# Patient Record
Sex: Male | Born: 1945
Health system: Southern US, Community
[De-identification: ages and names within clinical notes are randomized; demographics above are authoritative.]

## PROBLEM LIST (undated history)

## (undated) DIAGNOSIS — F32A Depression, unspecified: Secondary | ICD-10-CM

## (undated) DIAGNOSIS — E785 Hyperlipidemia, unspecified: Secondary | ICD-10-CM

## (undated) DIAGNOSIS — I639 Cerebral infarction, unspecified: Secondary | ICD-10-CM

## (undated) DIAGNOSIS — K3184 Gastroparesis: Secondary | ICD-10-CM

## (undated) DIAGNOSIS — K219 Gastro-esophageal reflux disease without esophagitis: Secondary | ICD-10-CM

## (undated) DIAGNOSIS — F329 Major depressive disorder, single episode, unspecified: Secondary | ICD-10-CM

## (undated) DIAGNOSIS — R51 Headache: Secondary | ICD-10-CM

## (undated) DIAGNOSIS — I10 Essential (primary) hypertension: Secondary | ICD-10-CM

## (undated) DIAGNOSIS — Z8719 Personal history of other diseases of the digestive system: Secondary | ICD-10-CM

## (undated) DIAGNOSIS — F419 Anxiety disorder, unspecified: Secondary | ICD-10-CM

## (undated) HISTORY — DX: Essential (primary) hypertension: I10

## (undated) HISTORY — PX: CHOLECYSTECTOMY: SHX55

## (undated) HISTORY — DX: Anxiety disorder, unspecified: F41.9

## (undated) HISTORY — DX: Hyperlipidemia, unspecified: E78.5

## (undated) HISTORY — PX: HERNIA REPAIR: SHX51

## (undated) HISTORY — DX: Cerebral infarction, unspecified: I63.9

## (undated) HISTORY — DX: Personal history of other diseases of the digestive system: Z87.19

## (undated) HISTORY — PX: ESOPHAGOGASTRIC FUNDOPLICATION: SHX405

## (undated) HISTORY — PX: EYE SURGERY: SHX253

## (undated) HISTORY — PX: LAPAROSCOPIC NISSEN FUNDOPLICATION: SHX1932

## (undated) HISTORY — DX: Depression, unspecified: F32.A

## (undated) HISTORY — DX: Major depressive disorder, single episode, unspecified: F32.9

## (undated) HISTORY — DX: Gastroparesis: K31.84

## (undated) HISTORY — DX: Gastro-esophageal reflux disease without esophagitis: K21.9

---

## 1998-08-07 ENCOUNTER — Encounter (INDEPENDENT_AMBULATORY_CARE_PROVIDER_SITE_OTHER): Payer: Self-pay | Admitting: Specialist

## 1998-08-07 ENCOUNTER — Observation Stay (HOSPITAL_COMMUNITY): Admission: EM | Admit: 1998-08-07 | Discharge: 1998-08-07 | Payer: Self-pay | Admitting: Emergency Medicine

## 1999-01-31 ENCOUNTER — Other Ambulatory Visit: Admission: RE | Admit: 1999-01-31 | Discharge: 1999-01-31 | Payer: Self-pay | Admitting: Orthopedic Surgery

## 2000-07-21 ENCOUNTER — Encounter (INDEPENDENT_AMBULATORY_CARE_PROVIDER_SITE_OTHER): Payer: Self-pay | Admitting: Specialist

## 2000-07-21 ENCOUNTER — Ambulatory Visit (HOSPITAL_COMMUNITY): Admission: RE | Admit: 2000-07-21 | Discharge: 2000-07-21 | Payer: Self-pay | Admitting: Gastroenterology

## 2001-12-19 ENCOUNTER — Encounter: Payer: Self-pay | Admitting: Urology

## 2001-12-19 ENCOUNTER — Encounter: Admission: RE | Admit: 2001-12-19 | Discharge: 2001-12-19 | Payer: Self-pay | Admitting: Urology

## 2002-12-27 ENCOUNTER — Ambulatory Visit (HOSPITAL_COMMUNITY): Admission: RE | Admit: 2002-12-27 | Discharge: 2002-12-27 | Payer: Self-pay | Admitting: Gastroenterology

## 2003-10-25 ENCOUNTER — Encounter (INDEPENDENT_AMBULATORY_CARE_PROVIDER_SITE_OTHER): Payer: Self-pay | Admitting: *Deleted

## 2003-10-25 ENCOUNTER — Ambulatory Visit (HOSPITAL_COMMUNITY): Admission: RE | Admit: 2003-10-25 | Discharge: 2003-10-25 | Payer: Self-pay | Admitting: Gastroenterology

## 2004-08-01 ENCOUNTER — Encounter (INDEPENDENT_AMBULATORY_CARE_PROVIDER_SITE_OTHER): Payer: Self-pay | Admitting: *Deleted

## 2004-08-01 ENCOUNTER — Ambulatory Visit (HOSPITAL_COMMUNITY): Admission: RE | Admit: 2004-08-01 | Discharge: 2004-08-01 | Payer: Self-pay | Admitting: Gastroenterology

## 2004-12-15 ENCOUNTER — Emergency Department (HOSPITAL_COMMUNITY): Admission: EM | Admit: 2004-12-15 | Discharge: 2004-12-15 | Payer: Self-pay | Admitting: Emergency Medicine

## 2005-01-14 ENCOUNTER — Encounter: Admission: RE | Admit: 2005-01-14 | Discharge: 2005-01-14 | Payer: Self-pay | Admitting: Family Medicine

## 2005-08-26 ENCOUNTER — Encounter: Admission: RE | Admit: 2005-08-26 | Discharge: 2005-08-26 | Payer: Self-pay | Admitting: Otolaryngology

## 2007-08-01 ENCOUNTER — Encounter: Admission: RE | Admit: 2007-08-01 | Discharge: 2007-08-01 | Payer: Self-pay | Admitting: Gastroenterology

## 2008-12-28 ENCOUNTER — Inpatient Hospital Stay (HOSPITAL_COMMUNITY): Admission: AD | Admit: 2008-12-28 | Discharge: 2008-12-31 | Payer: Self-pay | Admitting: Internal Medicine

## 2008-12-28 ENCOUNTER — Encounter: Admission: RE | Admit: 2008-12-28 | Discharge: 2008-12-28 | Payer: Self-pay | Admitting: Family Medicine

## 2008-12-31 ENCOUNTER — Encounter (INDEPENDENT_AMBULATORY_CARE_PROVIDER_SITE_OTHER): Payer: Self-pay | Admitting: Gastroenterology

## 2009-02-17 ENCOUNTER — Inpatient Hospital Stay (HOSPITAL_COMMUNITY): Admission: EM | Admit: 2009-02-17 | Discharge: 2009-02-24 | Payer: Self-pay | Admitting: Emergency Medicine

## 2009-04-03 ENCOUNTER — Ambulatory Visit (HOSPITAL_COMMUNITY): Admission: RE | Admit: 2009-04-03 | Discharge: 2009-04-03 | Payer: Self-pay | Admitting: Surgery

## 2009-10-22 ENCOUNTER — Encounter: Admission: RE | Admit: 2009-10-22 | Discharge: 2009-10-22 | Payer: Self-pay | Admitting: Internal Medicine

## 2010-03-09 ENCOUNTER — Encounter: Payer: Self-pay | Admitting: Gastroenterology

## 2010-05-04 LAB — CULTURE, BLOOD (ROUTINE X 2): Culture: NO GROWTH

## 2010-05-04 LAB — CBC
HCT: 34.4 % — ABNORMAL LOW (ref 39.0–52.0)
HCT: 34.6 % — ABNORMAL LOW (ref 39.0–52.0)
Hemoglobin: 11.4 g/dL — ABNORMAL LOW (ref 13.0–17.0)
Hemoglobin: 13.3 g/dL (ref 13.0–17.0)
Hemoglobin: 15.1 g/dL (ref 13.0–17.0)
MCHC: 33.2 g/dL (ref 30.0–36.0)
MCHC: 33.6 g/dL (ref 30.0–36.0)
MCHC: 33.8 g/dL (ref 30.0–36.0)
MCHC: 34 g/dL (ref 30.0–36.0)
MCV: 93.4 fL (ref 78.0–100.0)
MCV: 93.5 fL (ref 78.0–100.0)
MCV: 95 fL (ref 78.0–100.0)
Platelets: 281 10*3/uL (ref 150–400)
RBC: 3.7 MIL/uL — ABNORMAL LOW (ref 4.22–5.81)
RBC: 3.81 MIL/uL — ABNORMAL LOW (ref 4.22–5.81)
RBC: 4.52 MIL/uL (ref 4.22–5.81)
RDW: 13.1 % (ref 11.5–15.5)
RDW: 13.2 % (ref 11.5–15.5)
RDW: 13.4 % (ref 11.5–15.5)
WBC: 13.3 10*3/uL — ABNORMAL HIGH (ref 4.0–10.5)
WBC: 21.7 10*3/uL — ABNORMAL HIGH (ref 4.0–10.5)

## 2010-05-04 LAB — LIPASE, BLOOD
Lipase: 17 U/L (ref 11–59)
Lipase: 22 U/L (ref 11–59)
Lipase: 335 U/L — ABNORMAL HIGH (ref 11–59)
Lipase: 57 U/L (ref 11–59)

## 2010-05-04 LAB — URINALYSIS, ROUTINE W REFLEX MICROSCOPIC
Leukocytes, UA: NEGATIVE
Protein, ur: NEGATIVE mg/dL
Urobilinogen, UA: 1 mg/dL (ref 0.0–1.0)

## 2010-05-04 LAB — URINE CULTURE: Colony Count: 4000

## 2010-05-04 LAB — COMPREHENSIVE METABOLIC PANEL
ALT: 16 U/L (ref 0–53)
ALT: 23 U/L (ref 0–53)
AST: 18 U/L (ref 0–37)
AST: 22 U/L (ref 0–37)
AST: 29 U/L (ref 0–37)
Albumin: 2.1 g/dL — ABNORMAL LOW (ref 3.5–5.2)
Albumin: 2.2 g/dL — ABNORMAL LOW (ref 3.5–5.2)
Albumin: 3.6 g/dL (ref 3.5–5.2)
Alkaline Phosphatase: 71 U/L (ref 39–117)
Alkaline Phosphatase: 89 U/L (ref 39–117)
BUN: 7 mg/dL (ref 6–23)
BUN: 9 mg/dL (ref 6–23)
CO2: 25 mEq/L (ref 19–32)
CO2: 26 mEq/L (ref 19–32)
CO2: 26 mEq/L (ref 19–32)
Calcium: 8.3 mg/dL — ABNORMAL LOW (ref 8.4–10.5)
Calcium: 8.9 mg/dL (ref 8.4–10.5)
Chloride: 103 mEq/L (ref 96–112)
Chloride: 105 mEq/L (ref 96–112)
Creatinine, Ser: 1.04 mg/dL (ref 0.4–1.5)
Creatinine, Ser: 1.07 mg/dL (ref 0.4–1.5)
Creatinine, Ser: 1.16 mg/dL (ref 0.4–1.5)
GFR calc Af Amer: >60 mL/min (ref >60)
GFR calc Af Amer: >60 mL/min (ref >60)
GFR calc Af Amer: >60 mL/min (ref >60)
GFR calc non Af Amer: >60 mL/min (ref >60)
GFR calc non Af Amer: >60 mL/min (ref >60)
Glucose, Bld: 90 mg/dL (ref 70–99)
Potassium: 3.7 mEq/L (ref 3.5–5.1)
Potassium: 3.8 mEq/L (ref 3.5–5.1)
Sodium: 136 mEq/L (ref 135–145)
Sodium: 137 mEq/L (ref 135–145)
Sodium: 137 mEq/L (ref 135–145)
Total Bilirubin: 1.1 mg/dL (ref 0.3–1.2)
Total Bilirubin: 1.2 mg/dL (ref 0.3–1.2)
Total Protein: 5.2 g/dL — ABNORMAL LOW (ref 6.0–8.3)
Total Protein: 5.6 g/dL — ABNORMAL LOW (ref 6.0–8.3)

## 2010-05-04 LAB — DIFFERENTIAL
Basophils Absolute: 0 10*3/uL (ref 0.0–0.1)
Basophils Relative: 0 % (ref 0–1)
Eosinophils Absolute: 0 10*3/uL (ref 0.0–0.7)
Eosinophils Absolute: 0.2 10*3/uL (ref 0.0–0.7)
Eosinophils Relative: 0 % (ref 0–5)
Eosinophils Relative: 2 % (ref 0–5)
Lymphocytes Relative: 10 % — ABNORMAL LOW (ref 12–46)
Lymphocytes Relative: 11 % — ABNORMAL LOW (ref 12–46)
Lymphocytes Relative: 9 % — ABNORMAL LOW (ref 12–46)
Lymphs Abs: 1.1 10*3/uL (ref 0.7–4.0)
Lymphs Abs: 1.3 10*3/uL (ref 0.7–4.0)
Monocytes Absolute: 1 10*3/uL (ref 0.1–1.0)
Monocytes Absolute: 1.3 10*3/uL — ABNORMAL HIGH (ref 0.1–1.0)
Monocytes Relative: 10 % (ref 3–12)
Monocytes Relative: 12 % (ref 3–12)
Neutro Abs: 9.2 10*3/uL — ABNORMAL HIGH (ref 1.7–7.7)
Neutrophils Relative %: 77 % (ref 43–77)

## 2010-05-04 LAB — BASIC METABOLIC PANEL
BUN: 11 mg/dL (ref 6–23)
BUN: 14 mg/dL (ref 6–23)
Calcium: 8.5 mg/dL (ref 8.4–10.5)
Creatinine, Ser: 1.08 mg/dL (ref 0.4–1.5)
GFR calc Af Amer: >60 mL/min (ref >60)
GFR calc non Af Amer: >60 mL/min (ref >60)
Glucose, Bld: 83 mg/dL (ref 70–99)
Potassium: 4.4 mEq/L (ref 3.5–5.1)

## 2010-05-04 LAB — LIPID PANEL
Cholesterol: 123 mg/dL (ref 0–200)
Total CHOL/HDL Ratio: 2.5 RATIO
VLDL: 12 mg/dL (ref 0–40)

## 2010-05-04 LAB — URINE MICROSCOPIC-ADD ON

## 2010-05-21 LAB — COMPREHENSIVE METABOLIC PANEL
ALT: 15 U/L (ref 0–53)
AST: 14 U/L (ref 0–37)
AST: 17 U/L (ref 0–37)
Albumin: 2.8 g/dL — ABNORMAL LOW (ref 3.5–5.2)
Alkaline Phosphatase: 77 U/L (ref 39–117)
CO2: 26 mEq/L (ref 19–32)
Calcium: 9.1 mg/dL (ref 8.4–10.5)
Calcium: 9.2 mg/dL (ref 8.4–10.5)
Chloride: 104 mEq/L (ref 96–112)
Creatinine, Ser: 1.13 mg/dL (ref 0.4–1.5)
GFR calc Af Amer: >60 mL/min (ref >60)
GFR calc Af Amer: >60 mL/min (ref >60)
GFR calc non Af Amer: >60 mL/min (ref >60)
Potassium: 3.8 mEq/L (ref 3.5–5.1)
Sodium: 133 mEq/L — ABNORMAL LOW (ref 135–145)
Sodium: 139 mEq/L (ref 135–145)
Total Bilirubin: 0.9 mg/dL (ref 0.3–1.2)

## 2010-05-21 LAB — CBC
Hemoglobin: 14.4 g/dL (ref 13.0–17.0)
Hemoglobin: 15.1 g/dL (ref 13.0–17.0)
MCHC: 34.3 g/dL (ref 30.0–36.0)
MCV: 93.7 fL (ref 78.0–100.0)
Platelets: 254 10*3/uL (ref 150–400)
RBC: 4.39 MIL/uL (ref 4.22–5.81)
RBC: 4.63 MIL/uL (ref 4.22–5.81)
WBC: 10.2 10*3/uL (ref 4.0–10.5)
WBC: 15 10*3/uL — ABNORMAL HIGH (ref 4.0–10.5)

## 2010-05-21 LAB — HEPATIC FUNCTION PANEL
ALT: 15 U/L (ref 0–53)
AST: 11 U/L (ref 0–37)
Albumin: 3.1 g/dL — ABNORMAL LOW (ref 3.5–5.2)
Alkaline Phosphatase: 73 U/L (ref 39–117)
Total Bilirubin: 1.2 mg/dL (ref 0.3–1.2)

## 2010-05-21 LAB — CULTURE, BLOOD (ROUTINE X 2)
Culture: NO GROWTH
Culture: NO GROWTH

## 2010-05-21 LAB — BASIC METABOLIC PANEL
Calcium: 8.8 mg/dL (ref 8.4–10.5)
GFR calc Af Amer: >60 mL/min (ref >60)
GFR calc non Af Amer: >60 mL/min (ref >60)
Potassium: 3.6 mEq/L (ref 3.5–5.1)
Sodium: 134 mEq/L — ABNORMAL LOW (ref 135–145)

## 2010-05-21 LAB — AMYLASE: Amylase: 79 U/L (ref 27–131)

## 2010-07-04 NOTE — Procedures (Signed)
Haivana Nakya. [facility]  Patient:    Edward Zavala, Edward Zavala                          MRN: [id number] Proc. Date: 07/21/00 Adm. Date:  [id number] Attending:  Nelda Marseille CC:         Carola J. Gerri Spore, M.D.  Angelia Mould. Derrell Lolling, M.D.   Procedure Report  PROCEDURE PERFORMED:  Esophagogastroduodenoscopy with biopsies.  ENDOSCOPIST:  Petra Kuba, M.D.  INDICATIONS FOR PROCEDURE:  Barretts.  Overdue for screening.  Consent was signed after risks, benefits, methods, and options were thoroughly discussed multiple times in the past.  MEDICATIONS USED:  Demerol 50 mg, Versed 6 mg.  DESCRIPTION OF PROCEDURE:  The endoscope was inserted by direct vision. Proximal and midesophagus was normal.  In the distal esophagus, the customary changes of Barretts esophagus were seen.  No mass lesion was seen.  The scope was inserted into the stomach and advanced to antrum pertinent for some small antral erosions and antritis.  The pylorus was normal, advanced into a normal duodenal bulb and around the C-loop to a normal second portion of the duodenum.  The scope was withdrawn back to the bulb and a good look there ruled out ulcers in that location.  Scope was withdrawn back to the stomach and retroflexed.  Angularis, cardia and fundus, lesser and greater curve were all normal except for the changes of previous surgery seen in the cardia. Scope was straightened.  Straight visualization of the stomach was normal. Scope was then slowly withdrawn back to 20 cm which again confirmed a normal esophagus except for the distal Barretts and multiple biopsies of the distal Barretts was obtained in the customary fashion trying to get multiple biopsies from multiple areas.   Once obtained, the scope was again slowly withdrawn.  Again, a good look at the proximal esophagus was normal.  Scope was removed.  The patient tolerated the procedure well.  There was no obvious immediate  complication.  ENDOSCOPIC DIAGNOSIS: 1. Obvious Barretts in the distal esophagus, status post biopsy. 2. Status post hiatal hernia surgery and Nissen fundoplication. 3. Antritis and small antral erosions probably aspirin and nonsteroidal    induced. 4. Otherwise normal esophagogastroduodenoscopy.  PLAN:  Await pathology.  Follow up with GI p.r.n.  Follow up with Dr. Clyde Canterbury regarding his frequent urination for possible UA, micro, sugar level and probable PSA, but will leave up to her and happy to see back sooner as above. DD:  07/21/00 TD:  07/22/00 Job: [id number]

## 2010-07-04 NOTE — Op Note (Signed)
NAME:  Edward Zavala, Edward Zavala                             ACCOUNT NO.:  [id number]   MEDICAL RECORD NO.:  [id number]                   PATIENT TYPE:  AMB   LOCATION:  ENDO                                 FACILITY:  [facility]   PHYSICIAN:  Petra Kuba, M.D.                 DATE OF BIRTH:  07-Sep-1945   DATE OF PROCEDURE:  10/25/2003  DATE OF DISCHARGE:                                 OPERATIVE REPORT   PROCEDURE:  Colonoscopy with polypectomy.   INDICATIONS:  Screening.   Consent was signed after risks, benefits, methods, options thoroughly  discussed multiple times in the past.   MEDICINES USED:  Fentanyl 75 mcg, Versed 6.   PROCEDURE:  Rectal inspection is pertinent for external hemorrhoids, small.  Digital exam was negative.  The video pediatric adjustable colonoscope was  inserted and easily advanced around the colon to the level of the ileocecal  valve.  With abdominal pressure, we were able to advance to the cecum.  Some  position changes were needed on insertion.  Some left-sided diverticula were  seen but no other abnormalities.  The cecum was identified by the  appendiceal orifice and ileocecal valve.  The scope was inserted shortways  into the terminal ileum, which was normal.  Photo documentation was  obtained.  The scope was slowly withdrawn.  The prep was adequate.  There  was some liquid stool that required washing and suctioning.  In the cecal  pole, a small diverticula was seen as well as a small polyp, which was hot-  biopsied x2.  One other ascending diverticula, small, was seen.  No other  diverticula, polyps, tumors, masses, or other abnormalities were seen as we  withdrew back to the left side of the colon, where some predominantly  sigmoid moderate diverticula were seen.  No other polypoid lesions were seen  as we withdrew back to the rectum.  Anorectal and rectal pull-through and  retroflexion revealed some tiny-to-small hemorrhoids.  The scope was  reinserted a shortways  up the left side of the colon.  Air was suctioned.  The scope removed.  Patient tolerated the procedure well.  There was no  obvious intermediate complication.   ENDOSCOPIC DIAGNOSES:  1.  Internal/external hemorrhoids.  2.  Left-greater-than right rare diverticula.  3.  Cecal small polyp, hot biopsied.  4.  Otherwise within normal limits to the cecum and terminal ileum.   PLAN:  Await pathology to determine future colonic screening.  Happy to see  back p.r.n., otherwise if his upper tract symptoms get any worse, upgrade  Prilosec to Nexium.  Plan a repeat endoscopy for Barrett's next year but  happy to see sooner p.r.n.  Petra Kuba, M.D.    MEM/MEDQ  D:  10/25/2003  T:  10/25/2003  Job:  [id number]   cc:   Otilio Connors. Gerri Spore, M.D.  [address]  [place]  [place] [address]  Fax: [phone]

## 2010-07-04 NOTE — Op Note (Signed)
NAMECAILLOU, MINUS NO.:  [id number]   MEDICAL RECORD NO.:  [id number]          PATIENT TYPE:  AMB   LOCATION:  ENDO                         FACILITY:  [facility]   PHYSICIAN:  Petra Kuba, M.D.    DATE OF BIRTH:  [birth year]   DATE OF PROCEDURE:  08/01/2004  DATE OF DISCHARGE:                                 OPERATIVE REPORT   PROCEDURE:  Esophagogastroduodenoscopy with biopsy.   INDICATIONS:  Barrett's.   Consent was signed after risks, benefits, methods, options thoroughly  discussed multiple times in the past.   MEDICINES USED:  Demerol 50, Versed 5.   PROCEDURE:  The video endoscope was inserted by direct vision.  The proximal  and mid esophagus was normal.  In the distal esophagus were the customary  changes of short-segment Barrett's and hiatal hernia repair.  No signs of  inflammation, masses, strictures, or rings were seen.  This area was  extensively biopsied at the end of the procedure.  The scope was passed into  the stomach, advanced to the antrum.  Pertinent for some minimal antritis.  Advanced through a normal pylorus, into a normal duodenal bulb and around  the C loop to a normal second portion of the duodenum.  The scope was  withdrawn back into the bulb, and a good look there ruled out ulcers in all  locations.  The scope was withdrawn back to the stomach and retroflexed.  The angularis and fundus were normal.  High in the cardia, the hiatal hernia  repair was confirmed.  The lesser and greater curve were normal and  retroflexed on straight visualization.  No additional findings were seen.  The biopsies of the Barrett's were obtained at this point.  Air was  suctioned from the stomach.  The scope was slowly withdrawn.  Again, the  proximal and mid esophagus were normal.  The scope was removed.  The patient  tolerated the procedure well.  There were no obvious immediate  complications.   ENDOSCOPIC DIAGNOSES:  1.  Status post hiatal hernia  repair.  2.  Short segment Barrett's, status post biopsy.  3.  Minimal antritis.  4.  Otherwise esophagogastroduodenoscopy within normal limits without any      strictures, rings, masses, or worrisome findings.   PLAN:  Await pathology.  Follow up p.r.n.  Otherwise return to the care of  Dr. Gerri Spore for the customary health-care maintenance and screening, to  include yearly rectals and guaiacs.       MEM/MEDQ  D:  08/01/2004  T:  08/01/2004  Job:  [id number]   cc:   Otilio Connors. Gerri Spore, M.D.  [address]  [address]  [place]  [place] [address]  Fax: [phone]

## 2011-10-01 ENCOUNTER — Encounter (INDEPENDENT_AMBULATORY_CARE_PROVIDER_SITE_OTHER): Payer: Self-pay | Admitting: General Surgery

## 2011-10-01 ENCOUNTER — Ambulatory Visit (INDEPENDENT_AMBULATORY_CARE_PROVIDER_SITE_OTHER): Payer: Medicare Other | Admitting: General Surgery

## 2011-10-01 DIAGNOSIS — K432 Incisional hernia without obstruction or gangrene: Secondary | ICD-10-CM

## 2011-10-01 NOTE — Progress Notes (Signed)
Subjective:   Ventral hernia  Patient ID: Edward Zavala, male   DOB: 07-02-1945, 66 y.o.   MRN: [id number]  HPI Patient returns to the office at the request of Dr. Jacky Kindle for an apparent ventral hernia. The patient has a significant surgical history of laparoscopic Nissen fundoplication by Dr. Derrell Lolling about 2000. He subsequently had an incarcerated hernia at his supraumbilical port site that was fixed emergently in 2000. He has also had a laparoscopic cholecystectomy by Dr. Freida Busman in 2011. For a number of months he has noted a gradually enlarging bulge just above and to the right of his umbilicus. It is occasionally somewhat painful but he has no symptoms of bowel obstruction. He also notices a very slight bulge at his epigastric port site. Bowel movements are okay with some occasional diarrhea and is a chronic problem for him.  Past Medical History  Diagnosis Date  . Anxiety   . Depression   . GERD (gastroesophageal reflux disease)   . Hypertension   . H/O acute pancreatitis   . H/O gastroesophageal reflux (GERD)   . Dyslipidemia (high LDL; low HDL)    Past Surgical History  Procedure Date  . Cholecystectomy   . Laparoscopic nissen fundoplication   . Hernia repair     Port Site Hernia   Current Outpatient Prescriptions  Medication Sig Dispense Refill  . ALPRAZolam (XANAX) 0.25 MG tablet       . buPROPion (WELLBUTRIN XL) 150 MG 24 hr tablet       . lisinopril-hydrochlorothiazide (PRINZIDE,ZESTORETIC) 20-12.5 MG per tablet       . omeprazole (PRILOSEC) 40 MG capsule        Allergies no known allergies History  Substance Use Topics  . Smoking status: Former Smoker    Types: Cigarettes    Quit date: 09/30/1976  . Smokeless tobacco: Not on file  . Alcohol Use: Yes      Review of Systems  Constitutional: Negative.   HENT: Negative.   Respiratory: Negative.   Cardiovascular: Negative.   Gastrointestinal: Positive for abdominal pain and diarrhea. Negative for nausea, vomiting  and blood in stool.  Genitourinary: Positive for frequency and difficulty urinating.  Musculoskeletal: Negative.   Neurological: Negative.        Objective:   Physical Exam General: Alert, well-developed Caucasian male, in no distress Skin: Warm and dry without rash or infection. HEENT: No palpable masses or thyromegaly. Sclera nonicteric. Pupils equal round and reactive. Oropharynx clear. Lymph nodes: No cervical, supraclavicular, or inguinal nodes palpable. Lungs: Breath sounds clear and equal without increased work of breathing Cardiovascular: Regular rate and rhythm without murmur. No JVD or edema. Peripheral pulses intact. Abdomen: Nondistended. Soft and nontender. There is a palpable abdominal wall mass most consistent with a chronically incarcerated incisional hernia which presents just a bone and to the right of the umbilicus at the site of a previous supraumbilical port site. There is also a very small but definite reducible hernia at his previous epigastric port site. No other masses or organomegaly. Extremities: No edema or joint swelling or deformity. No chronic venous stasis changes. Neurologic: Alert and fully oriented. Gait normal.     Assessment:     Ventral incisional hernia x2. His supraumbilical hernia is certainly most significant but I believe since this will need to be repaired we should repair his epigastric hernia as well. I recommended a laparoscopic approach with mesh. We discussed the procedure in detail including indications, expected recovery, risk of anesthetic complications, bleeding, infection,  visceral injury, recurrence and chronic pain. He understands and agrees.    Plan:     Laparoscopic repair of ventral incisional hernia x2 at least overnight hospitalization. The patient will call regarding scheduling.

## 2011-10-22 ENCOUNTER — Encounter (INDEPENDENT_AMBULATORY_CARE_PROVIDER_SITE_OTHER): Payer: Self-pay | Admitting: General Surgery

## 2011-11-02 ENCOUNTER — Encounter (INDEPENDENT_AMBULATORY_CARE_PROVIDER_SITE_OTHER): Payer: Self-pay

## 2011-11-20 ENCOUNTER — Encounter (HOSPITAL_COMMUNITY): Payer: Self-pay | Admitting: Pharmacy Technician

## 2011-11-24 NOTE — Patient Instructions (Signed)
Edward Zavala  11/24/2011   Your procedure is scheduled on:12/01/11 0730am-0930am  Report to [facility] at 0530 AM.  Call this number if you have problems the morning of surgery: [phone]   Remember:   Do not eat food:After Midnight.  May have clear liquids:until Midnight .    Take these medicines the morning of surgery with A SIP OF WATER:    Do not wear jewelry,   Do not wear lotions, powders, or perfumes. .  . Men may shave face and neck.  Do not bring valuables to the hospital.  Contacts, dentures or bridgework may not be worn into surgery.  Leave suitcase in the car. After surgery it may be brought to your room.  For patients admitted to the hospital, checkout time is 11:00 AM the day of discharge.               SEE CHG INSTRUCTION SHEET    Please read over the following fact sheets that you were given: MRSA Information, coughing and deep breathing exercises, leg exercises

## 2011-11-25 ENCOUNTER — Encounter (HOSPITAL_COMMUNITY)
Admission: RE | Admit: 2011-11-25 | Discharge: 2011-11-25 | Disposition: A | Discharge status: Home / Self Care | Payer: Medicare Other | Source: Ambulatory Visit | Attending: General Surgery | Admitting: General Surgery

## 2011-11-25 ENCOUNTER — Encounter (HOSPITAL_COMMUNITY): Payer: Self-pay

## 2011-11-25 HISTORY — DX: Headache: R51

## 2011-11-25 LAB — CBC
HCT: 46.7 % (ref 39.0–52.0)
Hemoglobin: 15.9 g/dL (ref 13.0–17.0)
MCH: 31.4 pg (ref 26.0–34.0)
MCHC: 34 g/dL (ref 30.0–36.0)

## 2011-11-25 LAB — BASIC METABOLIC PANEL
BUN: 18 mg/dL (ref 6–23)
Chloride: 103 mEq/L (ref 96–112)
Glucose, Bld: 84 mg/dL (ref 70–99)
Potassium: 4.1 mEq/L (ref 3.5–5.1)

## 2011-11-25 NOTE — Progress Notes (Signed)
ECHO 8/12 chart Stress Test 8/12 chart  09/24/11 DR Jacky Kindle ( PCP) office visit note on chart

## 2011-11-26 ENCOUNTER — Telehealth (INDEPENDENT_AMBULATORY_CARE_PROVIDER_SITE_OTHER): Payer: Self-pay

## 2011-11-26 NOTE — Telephone Encounter (Signed)
Return call to patient- Left message to call our office (patient okay to fly as long as he has no post op complications)

## 2011-11-26 NOTE — Telephone Encounter (Signed)
Message copied by Maryan Puls on Thu Nov 26, 2011  4:00 PM ------      Message from: Marchia Bond      Created: Wed Nov 18, 2011  2:32 PM      Regarding: PT QUESTION      Contact: [phone]       PT CALLED AND IS HAVING SURGERY ON 10/15 AND WOULD LIKE TO FLY TO [place] ON 10/31 AND WOULD LIKE TO KNOW IF THAT'S OK CALL [facility] [phone]

## 2011-12-01 ENCOUNTER — Encounter (HOSPITAL_COMMUNITY): Payer: Self-pay | Admitting: Anesthesiology

## 2011-12-01 ENCOUNTER — Ambulatory Visit (HOSPITAL_COMMUNITY)
Admission: RE | Admit: 2011-12-01 | Discharge: 2011-12-02 | Disposition: A | Discharge status: Home / Self Care | Payer: Medicare Other | Source: Ambulatory Visit | Attending: General Surgery | Admitting: General Surgery

## 2011-12-01 ENCOUNTER — Encounter (HOSPITAL_COMMUNITY): Payer: Self-pay | Admitting: *Deleted

## 2011-12-01 ENCOUNTER — Ambulatory Visit (HOSPITAL_COMMUNITY): Payer: Medicare Other | Admitting: Anesthesiology

## 2011-12-01 ENCOUNTER — Encounter (HOSPITAL_COMMUNITY)
Admission: RE | Disposition: A | Discharge status: Home / Self Care | Payer: Self-pay | Source: Ambulatory Visit | Attending: General Surgery

## 2011-12-01 DIAGNOSIS — K439 Ventral hernia without obstruction or gangrene: Secondary | ICD-10-CM | POA: Insufficient documentation

## 2011-12-01 DIAGNOSIS — I10 Essential (primary) hypertension: Secondary | ICD-10-CM | POA: Insufficient documentation

## 2011-12-01 DIAGNOSIS — K219 Gastro-esophageal reflux disease without esophagitis: Secondary | ICD-10-CM | POA: Insufficient documentation

## 2011-12-01 DIAGNOSIS — Z23 Encounter for immunization: Secondary | ICD-10-CM | POA: Insufficient documentation

## 2011-12-01 DIAGNOSIS — K432 Incisional hernia without obstruction or gangrene: Secondary | ICD-10-CM

## 2011-12-01 DIAGNOSIS — E785 Hyperlipidemia, unspecified: Secondary | ICD-10-CM | POA: Insufficient documentation

## 2011-12-01 DIAGNOSIS — Z79899 Other long term (current) drug therapy: Secondary | ICD-10-CM | POA: Insufficient documentation

## 2011-12-01 DIAGNOSIS — Z9089 Acquired absence of other organs: Secondary | ICD-10-CM | POA: Insufficient documentation

## 2011-12-01 HISTORY — PX: VENTRAL HERNIA REPAIR: SHX424

## 2011-12-01 SURGERY — REPAIR, HERNIA, VENTRAL, LAPAROSCOPIC
Anesthesia: General | Site: Abdomen | Wound class: Clean

## 2011-12-01 MED ORDER — KETAMINE HCL 10 MG/ML IJ SOLN
INTRAMUSCULAR | Status: DC | PRN
  Administered 2011-12-01 (×2): 1 mg via INTRAVENOUS
  Administered 2011-12-01: 25 mg via INTRAVENOUS
  Administered 2011-12-01 (×3): 1 mg via INTRAVENOUS

## 2011-12-01 MED ORDER — ACETAMINOPHEN 10 MG/ML IV SOLN: 1000.0000 mg | Freq: Once | INTRAVENOUS | Status: DC | PRN

## 2011-12-01 MED ORDER — MORPHINE SULFATE 2 MG/ML IJ SOLN: 2.0000 mg | INTRAMUSCULAR | Status: DC | PRN

## 2011-12-01 MED ORDER — ACETAMINOPHEN 10 MG/ML IV SOLN
INTRAVENOUS | Status: DC | PRN
  Administered 2011-12-01: 1000 mg via INTRAVENOUS

## 2011-12-01 MED ORDER — OXYCODONE-ACETAMINOPHEN 5-325 MG PO TABS
1.0000 | ORAL_TABLET | ORAL | Status: DC | PRN
  Administered 2011-12-02: 1 via ORAL
  Filled 2011-12-01: qty 1

## 2011-12-01 MED ORDER — MEPERIDINE HCL 50 MG/ML IJ SOLN: 6.2500 mg | INTRAMUSCULAR | Status: DC | PRN

## 2011-12-01 MED ORDER — DEXAMETHASONE SODIUM PHOSPHATE 4 MG/ML IJ SOLN
INTRAMUSCULAR | Status: DC | PRN
  Administered 2011-12-01: 10 mg via INTRAVENOUS

## 2011-12-01 MED ORDER — PHENYLEPHRINE HCL 10 MG/ML IJ SOLN
INTRAMUSCULAR | Status: DC | PRN
  Administered 2011-12-01 (×2): 40 ug via INTRAVENOUS
  Administered 2011-12-01: 80 ug via INTRAVENOUS
  Administered 2011-12-01: 40 ug via INTRAVENOUS
  Administered 2011-12-01 (×2): 80 ug via INTRAVENOUS
  Administered 2011-12-01: 40 ug via INTRAVENOUS
  Administered 2011-12-01 (×2): 80 ug via INTRAVENOUS

## 2011-12-01 MED ORDER — ONDANSETRON HCL 4 MG/2ML IJ SOLN: 4.0000 mg | Freq: Four times a day (QID) | INTRAMUSCULAR | Status: DC | PRN

## 2011-12-01 MED ORDER — NEOSTIGMINE METHYLSULFATE 1 MG/ML IJ SOLN
INTRAMUSCULAR | Status: DC | PRN
  Administered 2011-12-01: 3 mg via INTRAVENOUS

## 2011-12-01 MED ORDER — GLYCOPYRROLATE 0.2 MG/ML IJ SOLN
INTRAMUSCULAR | Status: DC | PRN
  Administered 2011-12-01: 0.1 mg via INTRAVENOUS
  Administered 2011-12-01: 0.3 mg via INTRAVENOUS

## 2011-12-01 MED ORDER — OXYCODONE HCL 5 MG PO TABS: 5.0000 mg | ORAL_TABLET | Freq: Once | ORAL | Status: DC | PRN

## 2011-12-01 MED ORDER — LISINOPRIL 20 MG PO TABS
20.0000 mg | ORAL_TABLET | Freq: Every day | ORAL | Status: DC
  Administered 2011-12-01: 20 mg via ORAL
  Filled 2011-12-01 (×2): qty 1

## 2011-12-01 MED ORDER — HEPARIN SODIUM (PORCINE) 5000 UNIT/ML IJ SOLN
5000.0000 [IU] | Freq: Three times a day (TID) | INTRAMUSCULAR | Status: DC
  Administered 2011-12-01 – 2011-12-02 (×2): 5000 [IU] via SUBCUTANEOUS
  Filled 2011-12-01 (×5): qty 1

## 2011-12-01 MED ORDER — OXYCODONE HCL 5 MG/5ML PO SOLN
5.0000 mg | Freq: Once | ORAL | Status: DC | PRN
  Filled 2011-12-01: qty 5

## 2011-12-01 MED ORDER — ONDANSETRON HCL 4 MG PO TABS: 4.0000 mg | ORAL_TABLET | Freq: Four times a day (QID) | ORAL | Status: DC | PRN

## 2011-12-01 MED ORDER — HYDROCHLOROTHIAZIDE 12.5 MG PO CAPS
12.5000 mg | ORAL_CAPSULE | Freq: Every day | ORAL | Status: DC
  Administered 2011-12-01: 12.5 mg via ORAL
  Filled 2011-12-01 (×2): qty 1

## 2011-12-01 MED ORDER — LACTATED RINGERS IV SOLN
INTRAVENOUS | Status: DC | PRN
  Administered 2011-12-01 (×3): via INTRAVENOUS

## 2011-12-01 MED ORDER — CHLORHEXIDINE GLUCONATE 4 % EX LIQD: 1.0000 "application " | Freq: Once | CUTANEOUS | Status: DC

## 2011-12-01 MED ORDER — KETOROLAC TROMETHAMINE 30 MG/ML IJ SOLN
INTRAMUSCULAR | Status: DC | PRN
  Administered 2011-12-01: 30 mg via INTRAVENOUS

## 2011-12-01 MED ORDER — PANTOPRAZOLE SODIUM 40 MG PO TBEC
40.0000 mg | DELAYED_RELEASE_TABLET | Freq: Every day | ORAL | Status: DC
  Administered 2011-12-01: 40 mg via ORAL
  Filled 2011-12-01 (×2): qty 1

## 2011-12-01 MED ORDER — PROMETHAZINE HCL 25 MG/ML IJ SOLN: 6.2500 mg | INTRAMUSCULAR | Status: DC | PRN

## 2011-12-01 MED ORDER — ROCURONIUM BROMIDE 100 MG/10ML IV SOLN
INTRAVENOUS | Status: DC | PRN
  Administered 2011-12-01: 10 mg via INTRAVENOUS
  Administered 2011-12-01: 5 mg via INTRAVENOUS
  Administered 2011-12-01: 10 mg via INTRAVENOUS
  Administered 2011-12-01: 50 mg via INTRAVENOUS

## 2011-12-01 MED ORDER — LISINOPRIL-HYDROCHLOROTHIAZIDE 20-12.5 MG PO TABS: 1.0000 | ORAL_TABLET | Freq: Every day | ORAL | Status: DC

## 2011-12-01 MED ORDER — CEFAZOLIN SODIUM-DEXTROSE 2-3 GM-% IV SOLR
2.0000 g | INTRAVENOUS | Status: AC
  Administered 2011-12-01: 2 g via INTRAVENOUS

## 2011-12-01 MED ORDER — BUPIVACAINE-EPINEPHRINE 0.25% -1:200000 IJ SOLN
INTRAMUSCULAR | Status: DC | PRN
  Administered 2011-12-01: 30 mL

## 2011-12-01 MED ORDER — FENTANYL CITRATE 0.05 MG/ML IJ SOLN
INTRAMUSCULAR | Status: DC | PRN
  Administered 2011-12-01: 25 ug via INTRAVENOUS
  Administered 2011-12-01: 100 ug via INTRAVENOUS
  Administered 2011-12-01 (×3): 25 ug via INTRAVENOUS

## 2011-12-01 MED ORDER — HYDROMORPHONE HCL PF 1 MG/ML IJ SOLN: 0.2500 mg | INTRAMUSCULAR | Status: DC | PRN

## 2011-12-01 MED ORDER — POLYVINYL ALCOHOL 1.4 % OP SOLN
1.0000 [drp] | OPHTHALMIC | Status: DC | PRN
  Filled 2011-12-01: qty 15

## 2011-12-01 MED ORDER — METOCLOPRAMIDE HCL 5 MG/ML IJ SOLN
INTRAMUSCULAR | Status: DC | PRN
  Administered 2011-12-01: 10 mg via INTRAVENOUS

## 2011-12-01 MED ORDER — HYPROMELLOSE (GONIOSCOPIC) 2.5 % OP SOLN
1.0000 [drp] | OPHTHALMIC | Status: DC | PRN
  Filled 2011-12-01: qty 15

## 2011-12-01 MED ORDER — DEXTROSE IN LACTATED RINGERS 5 % IV SOLN
INTRAVENOUS | Status: DC
  Administered 2011-12-01: 13:00:00 via INTRAVENOUS

## 2011-12-01 MED ORDER — BUPROPION HCL ER (XL) 150 MG PO TB24
150.0000 mg | ORAL_TABLET | Freq: Every day | ORAL | Status: DC
  Filled 2011-12-01: qty 1

## 2011-12-01 MED ORDER — PROPOFOL 10 MG/ML IV BOLUS
INTRAVENOUS | Status: DC | PRN
  Administered 2011-12-01: 150 mg via INTRAVENOUS

## 2011-12-01 MED ORDER — MIDAZOLAM HCL 5 MG/5ML IJ SOLN
INTRAMUSCULAR | Status: DC | PRN
  Administered 2011-12-01: 2 mg via INTRAVENOUS

## 2011-12-01 MED ORDER — HYDROMORPHONE HCL PF 1 MG/ML IJ SOLN
INTRAMUSCULAR | Status: DC | PRN
  Administered 2011-12-01: 0.5 mg via INTRAVENOUS

## 2011-12-01 SURGICAL SUPPLY — 57 items
ADH SKN CLS APL DERMABOND .7 (GAUZE/BANDAGES/DRESSINGS) ×1
APL SKNCLS STERI-STRIP NONHPOA (GAUZE/BANDAGES/DRESSINGS)
APPLIER CLIP 5 13 M/L LIGAMAX5 (MISCELLANEOUS)
APR CLP MED LRG 5 ANG JAW (MISCELLANEOUS)
BENZOIN TINCTURE PRP APPL 2/3 (GAUZE/BANDAGES/DRESSINGS) IMPLANT
BINDER ABD UNIV 12 45-62 (WOUND CARE) IMPLANT
BINDER ABDOMINAL 46IN 62IN (WOUND CARE) ×2
CANISTER SUCTION 2500CC (MISCELLANEOUS) ×1 IMPLANT
CHLORAPREP W/TINT 26ML (MISCELLANEOUS) ×2 IMPLANT
CLIP APPLIE 5 13 M/L LIGAMAX5 (MISCELLANEOUS) IMPLANT
CLOTH BEACON ORANGE TIMEOUT ST (SAFETY) ×2 IMPLANT
COVER SURGICAL LIGHT HANDLE (MISCELLANEOUS) ×2 IMPLANT
DECANTER SPIKE VIAL GLASS SM (MISCELLANEOUS) ×1 IMPLANT
DERMABOND ADVANCED (GAUZE/BANDAGES/DRESSINGS) ×1
DERMABOND ADVANCED .7 DNX12 (GAUZE/BANDAGES/DRESSINGS) IMPLANT
DEVICE SECURE STRAP 25 ABSORB (INSTRUMENTS) ×2 IMPLANT
DEVICE TROCAR PUNCTURE CLOSURE (ENDOMECHANICALS) ×1 IMPLANT
DISSECTOR BLUNT TIP ENDO 5MM (MISCELLANEOUS) IMPLANT
DRAPE LAPAROSCOPIC ABDOMINAL (DRAPES) ×2 IMPLANT
DRAPE UTILITY XL STRL (DRAPES) ×2 IMPLANT
ELECT REM PT RETURN 9FT ADLT (ELECTROSURGICAL) ×2
ELECTRODE REM PT RTRN 9FT ADLT (ELECTROSURGICAL) ×1 IMPLANT
GLOVE BIOGEL PI IND STRL 7.0 (GLOVE) ×1 IMPLANT
GLOVE BIOGEL PI INDICATOR 7.0 (GLOVE) ×6
GLOVE SURG SS PI 7.5 STRL IVOR (GLOVE) ×2 IMPLANT
GOWN STRL NON-REIN LRG LVL3 (GOWN DISPOSABLE) ×1 IMPLANT
GOWN STRL REIN XL XLG (GOWN DISPOSABLE) ×6 IMPLANT
KIT BASIN OR (CUSTOM PROCEDURE TRAY) ×2 IMPLANT
MESH VENTRALEX ST 2.5 CRC MED (Mesh General) ×1 IMPLANT
MESH VENTRALIGHT ST 4.5IN (Mesh General) ×1 IMPLANT
NDL SPNL 22GX3.5 QUINCKE BK (NEEDLE) ×1 IMPLANT
NEEDLE SPNL 22GX3.5 QUINCKE BK (NEEDLE) ×2 IMPLANT
NS IRRIG 1000ML POUR BTL (IV SOLUTION) ×2 IMPLANT
PEN SKIN MARKING BROAD (MISCELLANEOUS) ×2 IMPLANT
PENCIL BUTTON HOLSTER BLD 10FT (ELECTRODE) IMPLANT
SCALPEL HARMONIC ACE (MISCELLANEOUS) ×1 IMPLANT
SCISSORS LAP 5X35 DISP (ENDOMECHANICALS) IMPLANT
SET IRRIG TUBING LAPAROSCOPIC (IRRIGATION / IRRIGATOR) IMPLANT
SLEEVE ADV FIXATION 5X100MM (TROCAR) IMPLANT
SLEEVE Z-THREAD 5X100MM (TROCAR) IMPLANT
SOLUTION ANTI FOG 6CC (MISCELLANEOUS) ×2 IMPLANT
STRIP CLOSURE SKIN 1/2X4 (GAUZE/BANDAGES/DRESSINGS) IMPLANT
SUT MNCRL AB 4-0 PS2 18 (SUTURE) ×2 IMPLANT
SUT NOVA NAB GS-21 0 18 T12 DT (SUTURE) IMPLANT
SUT PROLENE 0 CT 1 CR/8 (SUTURE) ×1 IMPLANT
TACKER 5MM HERNIA 3.5CML NAB (ENDOMECHANICALS) ×1 IMPLANT
TOWEL OR 17X26 10 PK STRL BLUE (TOWEL DISPOSABLE) ×3 IMPLANT
TRAY FOLEY CATH 14FRSI W/METER (CATHETERS) ×1 IMPLANT
TRAY LAP CHOLE (CUSTOM PROCEDURE TRAY) ×2 IMPLANT
TROCAR ADV FIXATION 11X100MM (TROCAR) IMPLANT
TROCAR ADV FIXATION 5X100MM (TROCAR) IMPLANT
TROCAR BLADELESS OPT 5 75 (ENDOMECHANICALS) ×1 IMPLANT
TROCAR XCEL NON-BLD 11X100MML (ENDOMECHANICALS) ×1 IMPLANT
TROCAR Z-THREAD FIOS 11X100 BL (TROCAR) IMPLANT
TROCAR Z-THREAD FIOS 5X100MM (TROCAR) ×2 IMPLANT
TROCAR Z-THREAD SLEEVE 11X100 (TROCAR) IMPLANT
TUBING INSUFFLATION 10FT LAP (TUBING) ×2 IMPLANT

## 2011-12-01 NOTE — Anesthesia Preprocedure Evaluation (Signed)
Anesthesia Evaluation  Patient identified by MRN, date of birth, ID band Patient awake    Reviewed: Allergy & Precautions, H&P , NPO status , Patient's Chart, lab work & pertinent test results  Airway       Dental  (+) Dental Advisory Given   Pulmonary neg pulmonary ROS, former smoker,          Cardiovascular hypertension, Pt. on medications     Neuro/Psych  Headaches, PSYCHIATRIC DISORDERS Anxiety Depression    GI/Hepatic Neg liver ROS, GERD-  Medicated,  Endo/Other  negative endocrine ROS  Renal/GU negative Renal ROS     Musculoskeletal negative musculoskeletal ROS (+)   Abdominal   Peds  Hematology negative hematology ROS (+)   Anesthesia Other Findings   Reproductive/Obstetrics                           Anesthesia Physical Anesthesia Plan  ASA: III  Anesthesia Plan: General   Post-op Pain Management:    Induction: Intravenous  Airway Management Planned: Oral ETT  Additional Equipment:   Intra-op Plan:   Post-operative Plan: Extubation in OR  Informed Consent: I have reviewed the patients History and Physical, chart, labs and discussed the procedure including the risks, benefits and alternatives for the proposed anesthesia with the patient or authorized representative who has indicated his/her understanding and acceptance.   Dental advisory given  Plan Discussed with: CRNA  Anesthesia Plan Comments:         Anesthesia Quick Evaluation

## 2011-12-01 NOTE — Op Note (Signed)
Preoperative Diagnosis: recurrent ventral incisional hernia  Postoprative Diagnosis: recurrent ventral incisional hernia  Procedure: Procedure(s): LAPAROSCOPIC VENTRAL HERNIA repair with repair of epigastric trocar site hernia   Surgeon: Glenna Fellows T   Assistants: None  Anesthesia:  General endotracheal anesthesiaDiagnos  Indications:   Patient is a 66 year old male with a history of laparoscopic cholecystectomy and laparoscopic hiatal hernia repair. He has a history of an open trocar site hernia repair at the supraumbilical trocar site. He has now developed an enlarging symptomatic recurrent hernia several centimeters in size at the same site. On exam he also has a small reducible hernia at his epigastric trocar site. I have recommended laparoscopic repair of both of these areas. We discussed indications and complications in detail described elsewhere.  Procedure Detail:  Patient is brought to the operating room, placed in the supine position on the operating table, and general endotracheal anesthesia induced. Foley catheter was placed. He received preoperative IV antibiotics. The abdomen was widely sterilely prepped and draped. Patient timeout was performed and correct procedure verified. Access was obtained in the left upper quadrant with a 5 mm Optiview trocar without difficulty and pneumoperitoneum established. Under direct vision a 5 mm trocar was placed laterally in the left abdomen and a 10 mm trocar in the left lower quadrant. The supraumbilical hernia site contained incarcerated omentum which was carefully brought down out of the hernia reducing a large portion of the omentum with blunt dissection and the harmonic scalpel. I then cleared the surrounding fascia of preperitoneal fat in preparation for mesh placement. The falciform ligament was taken down with the Harmonic scalpel working superiorly until I had exposed the small epigastric port site hernia which measured about a  centimeter in diameter and the fascia surrounding this for several centimeters was also cleared. The supraumbilical defect measured approximately 2-3 cm in diameter. I chose an 11.5 cm circular piece of Ventralex DualMesh. 4 stay sutures of 0 Prolene were placed. Corresponding areas on the anterior abdominal wall were marked and small stab incisions made. The mesh was moistened and introduced into the abdomen and unfurled and oriented. Stay sutures were then brought up through the stab incisions with very nice broad taut deployment of the mesh. These were secured. The Securestrap tacker was then used to circumferentially to further secure the mesh also using an interrupted. This appeared to provide very broad coverage over the defect. I then turned attention to the small epigastric hernia. A 1/2 cm incision was made at the hernia site and the peritoneum opened. A 6 cm ventral patch was then inserted and deployed underneath the fascia confirming nice broad deployment with the laparoscope. The fascial defect was closed incorporating the straps of the mesh with a single 0 Prolene suture and the straps were trimmed. The mesh was then secured around its edge with the Securestrap. The abdomen was inspected for hemostasis there was no bleeding or evidence of visceral injury. All CO2 was evacuated and trochars removed. Skin incisions were closed with subcuticular Monocryl and Dermabond. Sponge needle and instrument counts were correct.   Estimated Blood Loss:  Minimal         Drains: none  Blood Given: none          Specimens: none        Complications:  * No complications entered in OR log *         Disposition: PACU - hemodynamically stable.         Condition: stable  Mariella Saa  MD, FACS  12/01/2011, 9:34 AM

## 2011-12-01 NOTE — Anesthesia Postprocedure Evaluation (Signed)
Anesthesia Post Note  Patient: Edward Zavala  Procedure(s) Performed: Procedure(s) (LRB): LAPAROSCOPIC VENTRAL HERNIA (N/A)  Anesthesia type: General  Patient location: PACU  Post pain: Pain level controlled  Post assessment: Post-op Vital signs reviewed  Last Vitals: BP 129/81  Pulse 67  Temp 36.4 C  Resp 16  SpO2 97%  Post vital signs: Reviewed  Level of consciousness: sedated  Complications: No apparent anesthesia complications

## 2011-12-01 NOTE — H&P (Signed)
  HPI: The patient has a significant surgical history of laparoscopic Nissen fundoplication by Dr. Derrell Lolling about 2000. He subsequently had an incarcerated hernia at his supraumbilical port site that was fixed emergently in 2000. He has also had a laparoscopic cholecystectomy by Dr. Freida Busman in 2011. For a number of months he has noted a gradually enlarging bulge just above and to the right of his umbilicus. It is occasionally somewhat painful but he has no symptoms of bowel obstruction. He also notices a very slight bulge at his epigastric port site. Bowel movements are okay with some occasional diarrhea and is a chronic problem for him.  Past Medical History  Diagnosis Date  . Anxiety   . Depression   . GERD (gastroesophageal reflux disease)   . Hypertension   . H/O acute pancreatitis   . H/O gastroesophageal reflux (GERD)   . Dyslipidemia (high LDL; low HDL)   . Headache     occasinal    Past Surgical History  Procedure Date  . Cholecystectomy   . Laparoscopic nissen fundoplication   . Hernia repair     Port Site Hernia  . Eye surgery     age 66 months old    Current Facility-Administered Medications  Medication Dose Route Frequency Provider Last Rate Last Dose  . ceFAZolin (ANCEF) IVPB 2 g/50 mL premix  2 g Intravenous 60 min Pre-Op Mariella Saa, MD      . chlorhexidine (HIBICLENS) 4 % liquid 1 application  1 application Topical Once Mariella Saa, MD       Facility-Administered Medications Ordered in Other Encounters  Medication Dose Route Frequency Provider Last Rate Last Dose  . lactated ringers infusion    Continuous PRN Lattie Haw, CRNA       No Known Allergies  Physical Exam  General: Alert, well-developed Caucasian male, in no distress  Skin: Warm and dry without rash or infection.  HEENT: No palpable masses or thyromegaly. Sclera nonicteric. Pupils equal round and reactive. Oropharynx clear.  Lymph nodes: No cervical, supraclavicular, or  inguinal nodes palpable.  Lungs: Breath sounds clear and equal without increased work of breathing  Cardiovascular: Regular rate and rhythm without murmur. No JVD or edema. Peripheral pulses intact.  Abdomen: Nondistended. Soft and nontender. There is a palpable abdominal wall mass most consistent with a chronically incarcerated incisional hernia which presents just a bone and to the right of the umbilicus at the site of a previous supraumbilical port site. There is also a very small but definite reducible hernia at his previous epigastric port site. No other masses or organomegaly.  Extremities: No edema or joint swelling or deformity. No chronic venous stasis changes.  Neurologic: Alert and fully oriented. Gait normal.   A/P Ventral incisional hernia x2. His supraumbilical hernia is certainly most significant but I believe since this will need to be repaired we should repair his epigastric hernia as well. I recommended a laparoscopic approach with mesh. We discussed the procedure in detail including indications, expected recovery, risk of anesthetic complications, bleeding, infection, visceral injury, recurrence and chronic pain. He understands and agrees.  Mariella Saa MD, FACS  12/01/2011, 7:16 AM

## 2011-12-01 NOTE — Preoperative (Signed)
Beta Blockers   Reason not to administer Beta Blockers:Not Applicable, not on home BB 

## 2011-12-01 NOTE — Transfer of Care (Signed)
Immediate Anesthesia Transfer of Care Note  Patient: Edward Zavala  Procedure(s) Performed: Procedure(s) (LRB) with comments: LAPAROSCOPIC VENTRAL HERNIA (N/A) - x2  with mesh  Patient Location: PACU  Anesthesia Type: General  Level of Consciousness: awake, sedated and responds to stimulation  Airway & Oxygen Therapy: Patient Spontanous Breathing and Patient connected to face mask oxygen  Post-op Assessment: Report given to PACU RN, Post -op Vital signs reviewed and stable and Patient moving all extremities  Post vital signs: Reviewed and stable  Complications: No apparent anesthesia complications

## 2011-12-02 ENCOUNTER — Encounter (HOSPITAL_COMMUNITY): Payer: Self-pay | Admitting: General Surgery

## 2011-12-02 MED ORDER — INFLUENZA VIRUS VACC SPLIT PF IM SUSP
0.5000 mL | INTRAMUSCULAR | Status: DC
  Filled 2011-12-02: qty 0.5

## 2011-12-02 MED ORDER — HYDROCODONE-ACETAMINOPHEN 5-325 MG PO TABS: 1.0000 | ORAL_TABLET | ORAL | Status: DC | PRN

## 2011-12-02 MED ORDER — INFLUENZA VIRUS VACC SPLIT PF IM SUSP
0.5000 mL | INTRAMUSCULAR | Status: AC
  Administered 2011-12-02: 0.5 mL via INTRAMUSCULAR
  Filled 2011-12-02: qty 0.5

## 2011-12-02 NOTE — Progress Notes (Signed)
Patient discharged home, all discharge medications and instructions reviewed and questions answered. Pt refuses wheelchair assistance to vehicle, states prefers to ambulate.  

## 2011-12-02 NOTE — Discharge Summary (Signed)
   Patient ID: [name] [id number] 66 y.o. 1945-05-04  12/01/2011  Discharge date and time: 12/02/2011   Admitting Physician: Glenna Fellows T  Discharge Physician: Glenna Fellows T  Admission Diagnoses: ventral incisional hernia  Discharge Diagnoses: Same  Operations: Procedure(s): LAPAROSCOPIC VENTRAL HERNIA  Admission Condition: good  Discharged Condition: good  Indication for Admission: Recurrent ventral hernia  Hospital Course: Uneventful laparoscopic repair.  Observed overnight with out problems.  VSS, minimal pain, wounds clean  Disposition: Home  Patient Instructions:   Edward, Zavala  Home Medication Instructions FAO:[id number]   Printed on:12/02/11 0757  Medication Information                    buPROPion (WELLBUTRIN XL) 150 MG 24 hr tablet Take 150 mg by mouth daily before breakfast.            lisinopril-hydrochlorothiazide (PRINZIDE,ZESTORETIC) 20-12.5 MG per tablet Take 1 tablet by mouth daily before breakfast.            omeprazole (PRILOSEC) 40 MG capsule Take 40 mg by mouth 2 (two) times daily.            butalbital-aspirin-caffeine (FIORINAL) 50-325-40 MG per capsule Take 1 capsule by mouth as needed. Headache           aspirin-acetaminophen-caffeine (EXCEDRIN MIGRAINE) 250-250-65 MG per tablet Take 1 tablet by mouth every 6 (six) hours as needed. Headache           hydroxypropyl methylcellulose (ISOPTO TEARS) 2.5 % ophthalmic solution Place 1 drop into both eyes as needed. Dry eyes           HYDROcodone-acetaminophen (NORCO/VICODIN) 5-325 MG per tablet Take 1-2 tablets by mouth every 4 (four) hours as needed for pain.             Activity: no heavy lifting for 3 weeks Diet: regular diet Wound Care: none needed  Follow-up:  With Dr Johna Sheriff in 3 weeks.  Signed: Mariella Saa MD, FACS  12/02/2011, 7:57 AM

## 2011-12-25 ENCOUNTER — Encounter (INDEPENDENT_AMBULATORY_CARE_PROVIDER_SITE_OTHER): Payer: Medicare Other | Admitting: General Surgery

## 2012-01-04 ENCOUNTER — Ambulatory Visit (INDEPENDENT_AMBULATORY_CARE_PROVIDER_SITE_OTHER): Payer: Medicare Other | Admitting: General Surgery

## 2012-01-04 ENCOUNTER — Encounter (INDEPENDENT_AMBULATORY_CARE_PROVIDER_SITE_OTHER): Payer: Self-pay | Admitting: General Surgery

## 2012-01-04 VITALS — BP 132/88 | HR 102 | Temp 98.0°F | Resp 20 | Ht 68.0 in | Wt 195.8 lb

## 2012-01-04 DIAGNOSIS — Z09 Encounter for follow-up examination after completed treatment for conditions other than malignant neoplasm: Secondary | ICD-10-CM

## 2012-01-04 NOTE — Progress Notes (Signed)
History: The patient returns approximately one month following laparoscopic repair of a periumbilical incisional hernia as well as repair of a small trocar site  hernia at his epigastrium. His only complaint is one stay suture sites on the right side but occasionally catches him a little bit but he is back to full activity and this discomfort is fading.  Exam: Abdominal exam shows well-healed incisions. The hernia repair feels intact.  Assessment and plan: Doing well following laparoscopic ventral hernia repair. He is released to full activity. Will return as needed

## 2012-01-04 NOTE — Patient Instructions (Addendum)
Call as needed for any concerns °

## 2012-04-18 ENCOUNTER — Other Ambulatory Visit: Payer: Self-pay | Admitting: Gastroenterology

## 2013-03-21 ENCOUNTER — Other Ambulatory Visit: Payer: Self-pay | Admitting: Gastroenterology

## 2013-03-21 DIAGNOSIS — R109 Unspecified abdominal pain: Secondary | ICD-10-CM

## 2013-03-24 ENCOUNTER — Other Ambulatory Visit: Payer: Medicare Other

## 2013-03-27 ENCOUNTER — Ambulatory Visit
Admission: RE | Admit: 2013-03-27 | Discharge: 2013-03-27 | Disposition: A | Discharge status: Home / Self Care | Payer: Medicare Other | Source: Ambulatory Visit | Attending: Gastroenterology | Admitting: Gastroenterology

## 2013-03-27 DIAGNOSIS — R109 Unspecified abdominal pain: Secondary | ICD-10-CM

## 2013-03-27 MED ORDER — IOHEXOL 300 MG/ML  SOLN
125.0000 mL | Freq: Once | INTRAMUSCULAR | Status: AC | PRN
  Administered 2013-03-27: 125 mL via INTRAVENOUS

## 2013-04-27 ENCOUNTER — Ambulatory Visit
Admission: RE | Admit: 2013-04-27 | Discharge: 2013-04-27 | Disposition: A | Discharge status: Home / Self Care | Payer: Medicare Other | Source: Ambulatory Visit | Attending: Gastroenterology | Admitting: Gastroenterology

## 2013-04-27 ENCOUNTER — Other Ambulatory Visit: Payer: Self-pay | Admitting: Gastroenterology

## 2013-04-27 DIAGNOSIS — R1013 Epigastric pain: Secondary | ICD-10-CM

## 2013-04-27 DIAGNOSIS — R112 Nausea with vomiting, unspecified: Secondary | ICD-10-CM

## 2013-05-18 ENCOUNTER — Other Ambulatory Visit (HOSPITAL_COMMUNITY): Payer: Self-pay | Admitting: Gastroenterology

## 2013-05-18 DIAGNOSIS — R110 Nausea: Secondary | ICD-10-CM

## 2013-06-01 ENCOUNTER — Encounter (HOSPITAL_COMMUNITY)
Admission: RE | Admit: 2013-06-01 | Discharge: 2013-06-01 | Disposition: A | Discharge status: Home / Self Care | Payer: Medicare Other | Source: Ambulatory Visit | Attending: Gastroenterology | Admitting: Gastroenterology

## 2013-06-01 DIAGNOSIS — R110 Nausea: Secondary | ICD-10-CM | POA: Insufficient documentation

## 2013-06-01 MED ORDER — TECHNETIUM TC 99M SULFUR COLLOID
2.2000 | Freq: Once | INTRAVENOUS | Status: AC | PRN
  Administered 2013-06-01: 2.2 via ORAL

## 2014-08-14 ENCOUNTER — Other Ambulatory Visit: Payer: Self-pay | Admitting: Gastroenterology

## 2014-09-10 ENCOUNTER — Telehealth: Payer: Self-pay | Admitting: Internal Medicine

## 2014-09-10 NOTE — Telephone Encounter (Signed)
Unfortunately, I am not taking new pt's at this time due my very large pt panel and the fact that I will be opening a new office.

## 2014-09-10 NOTE — Telephone Encounter (Signed)
Notified pt wife. She will pursue a different physician.

## 2014-09-10 NOTE — Telephone Encounter (Signed)
Relation to pt: self  Call back number: [phone]   Reason for call:  Patient would like to schedule new patient appointment referred by Marquette Saa mrn# [phone]

## 2014-09-20 ENCOUNTER — Telehealth: Payer: Self-pay | Admitting: Family Medicine

## 2014-09-20 NOTE — Telephone Encounter (Signed)
Pt wife calling again this time stating that Dr. Ardine Eng also referred her to Dr. Birdie Riddle. She said that she is aware she is moving to [place] and is willing to go to that practice as well if Dr. Birdie Riddle would be kind enough to accept her and her husband as pts. Please advise.

## 2014-10-09 NOTE — Telephone Encounter (Signed)
Ok for pt to establish 

## 2014-10-10 NOTE — Telephone Encounter (Signed)
Lm for pt to call and schedule new pt appt

## 2017-04-24 ENCOUNTER — Inpatient Hospital Stay (HOSPITAL_COMMUNITY)
Admission: EM | Admit: 2017-04-24 | Discharge: 2017-04-25 | DRG: 067 | Disposition: A | Discharge status: Home / Self Care | Payer: Medicare Other | Attending: Internal Medicine | Admitting: Internal Medicine

## 2017-04-24 ENCOUNTER — Encounter (HOSPITAL_COMMUNITY): Payer: Self-pay | Admitting: *Deleted

## 2017-04-24 ENCOUNTER — Emergency Department (HOSPITAL_COMMUNITY): Payer: Medicare Other

## 2017-04-24 DIAGNOSIS — Z79899 Other long term (current) drug therapy: Secondary | ICD-10-CM

## 2017-04-24 DIAGNOSIS — K219 Gastro-esophageal reflux disease without esophagitis: Secondary | ICD-10-CM | POA: Diagnosis present

## 2017-04-24 DIAGNOSIS — E785 Hyperlipidemia, unspecified: Secondary | ICD-10-CM | POA: Diagnosis not present

## 2017-04-24 DIAGNOSIS — I10 Essential (primary) hypertension: Secondary | ICD-10-CM | POA: Diagnosis present

## 2017-04-24 DIAGNOSIS — R2689 Other abnormalities of gait and mobility: Secondary | ICD-10-CM | POA: Diagnosis not present

## 2017-04-24 DIAGNOSIS — G450 Vertebro-basilar artery syndrome: Secondary | ICD-10-CM | POA: Diagnosis present

## 2017-04-24 DIAGNOSIS — R51 Headache: Secondary | ICD-10-CM | POA: Diagnosis present

## 2017-04-24 DIAGNOSIS — F329 Major depressive disorder, single episode, unspecified: Secondary | ICD-10-CM | POA: Diagnosis present

## 2017-04-24 DIAGNOSIS — I7774 Dissection of vertebral artery: Secondary | ICD-10-CM | POA: Diagnosis not present

## 2017-04-24 DIAGNOSIS — Z8673 Personal history of transient ischemic attack (TIA), and cerebral infarction without residual deficits: Secondary | ICD-10-CM | POA: Diagnosis present

## 2017-04-24 DIAGNOSIS — R29700 NIHSS score 0: Secondary | ICD-10-CM | POA: Diagnosis not present

## 2017-04-24 DIAGNOSIS — F419 Anxiety disorder, unspecified: Secondary | ICD-10-CM | POA: Diagnosis present

## 2017-04-24 DIAGNOSIS — Z87891 Personal history of nicotine dependence: Secondary | ICD-10-CM | POA: Diagnosis not present

## 2017-04-24 DIAGNOSIS — I6502 Occlusion and stenosis of left vertebral artery: Principal | ICD-10-CM | POA: Diagnosis present

## 2017-04-24 DIAGNOSIS — R402413 Glasgow coma scale score 13-15, at hospital admission: Secondary | ICD-10-CM | POA: Diagnosis not present

## 2017-04-24 DIAGNOSIS — I639 Cerebral infarction, unspecified: Secondary | ICD-10-CM

## 2017-04-24 DIAGNOSIS — I459 Conduction disorder, unspecified: Secondary | ICD-10-CM | POA: Diagnosis not present

## 2017-04-24 DIAGNOSIS — I6340 Cerebral infarction due to embolism of unspecified cerebral artery: Secondary | ICD-10-CM

## 2017-04-24 DIAGNOSIS — I6330 Cerebral infarction due to thrombosis of unspecified cerebral artery: Secondary | ICD-10-CM

## 2017-04-24 LAB — URINALYSIS, ROUTINE W REFLEX MICROSCOPIC
Bacteria, UA: NONE SEEN
Bilirubin Urine: NEGATIVE
Glucose, UA: NEGATIVE mg/dL
KETONES UR: NEGATIVE mg/dL
Leukocytes, UA: NEGATIVE
Nitrite: NEGATIVE
PH: 7 (ref 5.0–8.0)
Protein, ur: NEGATIVE mg/dL
Specific Gravity, Urine: 1.016 (ref 1.005–1.030)
Squamous Epithelial / LPF: NONE SEEN

## 2017-04-24 LAB — BASIC METABOLIC PANEL
Anion gap: 10 (ref 5–15)
BUN: 13 mg/dL (ref 6–20)
CALCIUM: 9.3 mg/dL (ref 8.9–10.3)
CO2: 24 mmol/L (ref 22–32)
CREATININE: 1.21 mg/dL (ref 0.61–1.24)
Chloride: 107 mmol/L (ref 101–111)
GFR calc Af Amer: >60 mL/min (ref >60)
GFR calc non Af Amer: 58 mL/min — ABNORMAL LOW (ref >60)
Glucose, Bld: 105 mg/dL — ABNORMAL HIGH (ref 65–99)
Potassium: 3.8 mmol/L (ref 3.5–5.1)
SODIUM: 141 mmol/L (ref 135–145)

## 2017-04-24 LAB — CBC
HCT: 46.1 % (ref 39.0–52.0)
Hemoglobin: 15.6 g/dL (ref 13.0–17.0)
MCH: 31.7 pg (ref 26.0–34.0)
MCHC: 33.8 g/dL (ref 30.0–36.0)
MCV: 93.7 fL (ref 78.0–100.0)
Platelets: 246 10*3/uL (ref 150–400)
RBC: 4.92 MIL/uL (ref 4.22–5.81)
RDW: 12.9 % (ref 11.5–15.5)
WBC: 8.1 10*3/uL (ref 4.0–10.5)

## 2017-04-24 LAB — CBG MONITORING, ED: Glucose-Capillary: 97 mg/dL (ref 65–99)

## 2017-04-24 MED ORDER — LORAZEPAM 2 MG/ML IJ SOLN
0.5000 mg | Freq: Once | INTRAMUSCULAR | Status: AC
  Administered 2017-04-24: 0.5 mg via INTRAVENOUS
  Filled 2017-04-24: qty 1

## 2017-04-24 MED ORDER — GADOBENATE DIMEGLUMINE 529 MG/ML IV SOLN
17.0000 mL | Freq: Once | INTRAVENOUS | Status: AC | PRN
  Administered 2017-04-24: 17 mL via INTRAVENOUS

## 2017-04-24 MED ORDER — IOPAMIDOL (ISOVUE-370) INJECTION 76%
INTRAVENOUS | Status: AC
  Filled 2017-04-24: qty 50

## 2017-04-24 NOTE — ED Provider Notes (Signed)
PROGRESS NOTE                                                                                                                 This is a sign-out from Dr. Cathleen Fears at shift change: Edward Zavala is a 72 y.o. male presenting with vertiginous sensation followed by posterior neck pain, spontaneously resolving.  Imaging showed dissection versus embolus.  Neurology feels that this may be chronic.  They recommend obtaining CTA, Dr. Malen Gauze will follow up results.  Please refer to previous note for full HPI, ROS, PMH and PE.   Hospitalist Dr. Malen Gauze has evaluated CTA and does believe that this is acute, he recommends admission for stroke workup including echo.  Discussed with Dr. Alcario Drought who accepts admission for stroke workup.      Monico Blitz, Hershal Coria 04/25/17 0041    Orlie Dakin, MD 04/25/17 [phone]

## 2017-04-24 NOTE — ED Notes (Signed)
Patient transported to MRI 

## 2017-04-24 NOTE — ED Triage Notes (Signed)
To ED for eval of sudden dizziness while standing at urinal in a restaurant bathroom. Pt states he had to hold on the walls to get out of building and into a chair. States he now feels 'woozy'. Dizzy when moving head side to side. Speech is clear. Grips are equal and strong. States he would fall if he tried to stand up right now- denies legs being weak but states 'I just can't control them if I were to take steps'.

## 2017-04-24 NOTE — ED Provider Notes (Signed)
[facility] EMERGENCY DEPARTMENT Provider Note   CSN: [id number] Arrival date & time: 04/24/17  1605     History   Chief Complaint Chief Complaint  Patient presents with  . Dizziness  . Weakness    HPI Edward Zavala is a 72 y.o. male.  Patient developed dizziness meaning sensation of room spinning and off balance accompanied by nausea for 10 AM today.  A few minutes after the dizziness began he developed posterior neck pain nothing makes symptoms better or worse.  He presently complains of mild posterior neck pain vertigo has resolved.  He denies any focal numbness or weakness though his wife states he was lightly dragging 1 of his legs earlier today.  Denies that he was dragging his leg. other associated symptoms include nausea.  No vomiting.  No change in his speech or vision.  No treatment prior to coming here.  Nothing makes symptoms better or worse.  No other associated symptoms  HPI  Past Medical History:  Diagnosis Date  . Anxiety   . Depression   . Dyslipidemia (high LDL; low HDL)   . GERD (gastroesophageal reflux disease)   . H/O acute pancreatitis   . H/O gastroesophageal reflux (GERD)   . Headache(784.0)    occasinal   . Hypertension     There are no active problems to display for this patient.   Past Surgical History:  Procedure Laterality Date  . CHOLECYSTECTOMY    . EYE SURGERY     age 44 months old   . HERNIA REPAIR     Port Site Hernia  . LAPAROSCOPIC NISSEN FUNDOPLICATION    . VENTRAL HERNIA REPAIR  12/01/2011   Procedure: LAPAROSCOPIC VENTRAL HERNIA;  Surgeon: Edward Jolly, MD;  Location: [facility];  Service: General;  Laterality: N/A;  x2  with mesh       Home Medications    Prior to Admission medications   Medication Sig Start Date End Date Taking? Authorizing Provider  aspirin-acetaminophen-caffeine (EXCEDRIN MIGRAINE) [phone] MG per tablet Take 1 tablet by mouth every 6 (six) hours as needed. Headache    [provider]  buPROPion (WELLBUTRIN XL) 150 MG 24 hr tablet Take 150 mg by mouth daily before breakfast.  09/28/11   [provider]  butalbital-aspirin-caffeine Acquanetta Chain) 50-325-40 MG per capsule Take 1 capsule by mouth as needed. Headache    [provider]  HYDROcodone-acetaminophen (NORCO/VICODIN) 5-325 MG per tablet Take 1-2 tablets by mouth every 4 (four) hours as needed for pain. 12/02/11   Excell Seltzer, MD  hydroxypropyl methylcellulose (ISOPTO TEARS) 2.5 % ophthalmic solution Place 1 drop into both eyes as needed. Dry eyes    [provider]  lisinopril-hydrochlorothiazide (PRINZIDE,ZESTORETIC) 20-12.5 MG per tablet Take 1 tablet by mouth daily before breakfast.  08/25/11   [provider]  omeprazole (PRILOSEC) 40 MG capsule Take 40 mg by mouth 2 (two) times daily.  08/26/11   [provider]    Family History Family History  Problem Relation Age of Onset  . Cancer Mother 38       Breast Cancer    Social History Social History   Tobacco Use  . Smoking status: Former Smoker    Types: Cigarettes    Last attempt to quit: 09/30/1976    Years since quitting: 40.5  . Smokeless tobacco: Never Used  Substance Use Topics  . Alcohol use: Yes    Alcohol/week: 2.4 oz    Types: 4 Glasses of  wine per week  . Drug use: No     Allergies   Patient has no known allergies.   Review of Systems Review of Systems  HENT: Positive for tinnitus.        Chronic tinnitus  Gastrointestinal: Positive for nausea.  Neurological: Positive for dizziness.  All other systems reviewed and are negative.    Physical Exam Updated Vital Signs BP (!) 160/108   Pulse 83   Temp 98.5 F (36.9 C) (Oral)   Resp 19   SpO2 96%   Physical Exam  Constitutional: He is oriented to person, place, and time. He appears well-developed and well-nourished.  HENT:  Head: Normocephalic and atraumatic.  biLateral tympanic membranes normal  Eyes: Conjunctivae  are normal. Pupils are equal, round, and reactive to light.  Neck: Neck supple. No tracheal deviation present. No thyromegaly present.  No bruit  Cardiovascular: Normal rate and regular rhythm.  No murmur heard. Pulmonary/Chest: Effort normal and breath sounds normal.  Abdominal: Soft. Bowel sounds are normal. He exhibits no distension. There is no tenderness.  Musculoskeletal: Normal range of motion. He exhibits no edema or tenderness.  Neurological: He is alert and oriented to person, place, and time. Coordination normal.  Gait normal Romberg normal pronator drift normal finger to nose normal DTR symmetric bilaterally at knee jerk ankle jerk and biceps was overgrown bilaterally  Skin: Skin is warm and dry. No rash noted.  Psychiatric: He has a normal mood and affect.  Nursing note and vitals reviewed.    ED Treatments / Results  Labs (all labs ordered are listed, but only abnormal results are displayed) Labs Reviewed  BASIC METABOLIC PANEL - Abnormal; Notable for the following components:      Result Value   Glucose, Bld 105 (*)    GFR calc non Af Amer 58 (*)    All other components within normal limits  CBC  URINALYSIS, ROUTINE W REFLEX MICROSCOPIC  CBG MONITORING, ED  CBG MONITORING, ED    EKG  EKG Interpretation  Date/Time:  Saturday April 24 2017 16:15:00 EST Ventricular Rate:  84 PR Interval:  202 QRS Duration: 90 QT Interval:  392 QTC Calculation: 463 R Axis:   -10 Text Interpretation:  Normal sinus rhythm Anterior infarct , age undetermined Abnormal ECG No significant change since last tracing Confirmed by Orlie Dakin [phone]) on 04/24/2017 6:05:22 PM      Results for orders placed or performed during the hospital encounter of 60/73/71  Basic metabolic panel  Result Value Ref Range   Sodium 141 135 - 145 mmol/L   Potassium 3.8 3.5 - 5.1 mmol/L   Chloride 107 101 - 111 mmol/L   CO2 24 22 - 32 mmol/L   Glucose, Bld 105 (H) 65 - 99 mg/dL   BUN 13 6 - 20  mg/dL   Creatinine, Ser 1.21 0.61 - 1.24 mg/dL   Calcium 9.3 8.9 - 10.3 mg/dL   GFR calc non Af Amer 58 (L) >60 mL/min   GFR calc Af Amer >60 >60 mL/min   Anion gap 10 5 - 15  CBC  Result Value Ref Range   WBC 8.1 4.0 - 10.5 K/uL   RBC 4.92 4.22 - 5.81 MIL/uL   Hemoglobin 15.6 13.0 - 17.0 g/dL   HCT 46.1 39.0 - 52.0 %   MCV 93.7 78.0 - 100.0 fL   MCH 31.7 26.0 - 34.0 pg   MCHC 33.8 30.0 - 36.0 g/dL   RDW 12.9 11.5 - 15.5 %  Platelets 246 150 - 400 K/uL  Urinalysis, Routine w reflex microscopic  Result Value Ref Range   Color, Urine YELLOW YELLOW   APPearance CLEAR CLEAR   Specific Gravity, Urine 1.016 1.005 - 1.030   pH 7.0 5.0 - 8.0   Glucose, UA NEGATIVE NEGATIVE mg/dL   Hgb urine dipstick SMALL (A) NEGATIVE   Bilirubin Urine NEGATIVE NEGATIVE   Ketones, ur NEGATIVE NEGATIVE mg/dL   Protein, ur NEGATIVE NEGATIVE mg/dL   Nitrite NEGATIVE NEGATIVE   Leukocytes, UA NEGATIVE NEGATIVE   RBC / HPF 6-30 0 - 5 RBC/hpf   WBC, UA 0-5 0 - 5 WBC/hpf   Bacteria, UA NONE SEEN NONE SEEN   Squamous Epithelial / LPF NONE SEEN NONE SEEN   Mucus PRESENT   CBG monitoring, ED  Result Value Ref Range   Glucose-Capillary 97 65 - 99 mg/dL   Mr Angiogram Head Wo Contrast  Result Date: 04/24/2017 CLINICAL DATA:  72 y/o M; sudden dizziness. Ataxia, stroke suspected. EXAM: MR HEAD WITHOUT CONTRAST MRA HEAD WITHOUT CONTRAST MRA OF THE NECK WITHOUT AND WITH CONTRAST TECHNIQUE: Multiplanar, multiecho pulse sequences of the brain and surrounding structures were obtained without intravenous contrast. Angiographic images of the neck were obtained using MRA technique without and with intravenous contrast. Angiographic images of the head were obtained using MRA technique without intravenous contrast. CONTRAST:  59mL MULTIHANCE GADOBENATE DIMEGLUMINE 529 MG/ML IV SOLN COMPARISON:  None. FINDINGS: MR HEAD FINDINGS Brain: No acute infarction, hemorrhage, hydrocephalus, extra-axial collection or mass lesion. Few  nonspecific foci of T2 FLAIR hyperintense signal abnormality in subcortical white matter of unlikely clinical significance. Vascular: Abnormal signal within the left vertebral artery with associated susceptibility blooming. Otherwise normal flow voids. Skull and upper cervical spine: Normal marrow signal. Sinuses/Orbits: Negative. Other: None. MRA HEAD FINDINGS Internal carotid arteries:  Patent. Anterior cerebral arteries:  Patent. Middle cerebral arteries: Patent. Anterior communicating artery: Patent. Posterior communicating arteries: Patent left. No right identified, likely hypoplastic or absent. Posterior cerebral arteries:  Patent. Basilar artery:  Patent. Vertebral arteries: Patent right. Faint flow related signal is present within the proximal left V4 segment with occlusion of the vessel beyond foramen magnum to the vertebrobasilar junction. This finding corresponds to signal abnormality on MRI of the head and absent enhancement within the V4 segment on MRA of the neck. There is a large left AICA/PICA branch perfusing inferior cerebellum. No evidence of high-grade stenosis, large vessel occlusion, or aneurysm unless noted above. MRA NECK FINDINGS Aortic arch: Patent. Right common carotid artery: Patent. Right internal carotid artery: Patent. Right vertebral artery: Patent. Left common carotid artery: Patent. Left Internal carotid artery: Patent. Left Vertebral artery: There is enhancement within left V1, V2, and V3 segments the decreases in a gradient fashion downstream compatible with slow antegrade flow. The left V4 segment is occluded from the foramen magnum. There is no evidence of hemodynamically significant stenosis by NASCET criteria, occlusion, or aneurysm unless noted above. IMPRESSION: MRI head: No acute intracranial abnormality. Unremarkable MRI of the brain for age. MRA head: 1. 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 2. Otherwise patent anterior and posterior  intracranial circulation. MRA neck: 1. Left V4 occlusion. Patent enhancement of left V1, V2, and V3 segments with gradient decreasing downstream enhancement compatible slow antegrade flow. 2. Widely patent bilateral carotid systems and right vertebral artery. These results were called by telephone at the time of interpretation on 04/24/2017 at 10:40 pm to Dr. Orlie Dakin , who verbally acknowledged these results. Electronically Signed  By: Kristine Garbe M.D.   On: 04/24/2017 22:43   Mr Angiogram Neck W Or Wo Contrast  Result Date: 04/24/2017 CLINICAL DATA:  72 y/o M; sudden dizziness. Ataxia, stroke suspected. EXAM: MR HEAD WITHOUT CONTRAST MRA HEAD WITHOUT CONTRAST MRA OF THE NECK WITHOUT AND WITH CONTRAST TECHNIQUE: Multiplanar, multiecho pulse sequences of the brain and surrounding structures were obtained without intravenous contrast. Angiographic images of the neck were obtained using MRA technique without and with intravenous contrast. Angiographic images of the head were obtained using MRA technique without intravenous contrast. CONTRAST:  87mL MULTIHANCE GADOBENATE DIMEGLUMINE 529 MG/ML IV SOLN COMPARISON:  None. FINDINGS: MR HEAD FINDINGS Brain: No acute infarction, hemorrhage, hydrocephalus, extra-axial collection or mass lesion. Few nonspecific foci of T2 FLAIR hyperintense signal abnormality in subcortical white matter of unlikely clinical significance. Vascular: Abnormal signal within the left vertebral artery with associated susceptibility blooming. Otherwise normal flow voids. Skull and upper cervical spine: Normal marrow signal. Sinuses/Orbits: Negative. Other: None. MRA HEAD FINDINGS Internal carotid arteries:  Patent. Anterior cerebral arteries:  Patent. Middle cerebral arteries: Patent. Anterior communicating artery: Patent. Posterior communicating arteries: Patent left. No right identified, likely hypoplastic or absent. Posterior cerebral arteries:  Patent. Basilar artery:   Patent. Vertebral arteries: Patent right. Faint flow related signal is present within the proximal left V4 segment with occlusion of the vessel beyond foramen magnum to the vertebrobasilar junction. This finding corresponds to signal abnormality on MRI of the head and absent enhancement within the V4 segment on MRA of the neck. There is a large left AICA/PICA branch perfusing inferior cerebellum. No evidence of high-grade stenosis, large vessel occlusion, or aneurysm unless noted above. MRA NECK FINDINGS Aortic arch: Patent. Right common carotid artery: Patent. Right internal carotid artery: Patent. Right vertebral artery: Patent. Left common carotid artery: Patent. Left Internal carotid artery: Patent. Left Vertebral artery: There is enhancement within left V1, V2, and V3 segments the decreases in a gradient fashion downstream compatible with slow antegrade flow. The left V4 segment is occluded from the foramen magnum. There is no evidence of hemodynamically significant stenosis by NASCET criteria, occlusion, or aneurysm unless noted above. IMPRESSION: MRI head: No acute intracranial abnormality. Unremarkable MRI of the brain for age. MRA head: 1. 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 2. Otherwise patent anterior and posterior intracranial circulation. MRA neck: 1. Left V4 occlusion. Patent enhancement of left V1, V2, and V3 segments with gradient decreasing downstream enhancement compatible slow antegrade flow. 2. Widely patent bilateral carotid systems and right vertebral artery. These results were called by telephone at the time of interpretation on 04/24/2017 at 10:40 pm to Dr. Orlie Dakin , who verbally acknowledged these results. Electronically Signed   By: Kristine Garbe M.D.   On: 04/24/2017 22:43   Mr Brain Wo Contrast  Result Date: 04/24/2017 CLINICAL DATA:  72 y/o M; sudden dizziness. Ataxia, stroke suspected. EXAM: MR HEAD WITHOUT CONTRAST MRA HEAD WITHOUT  CONTRAST MRA OF THE NECK WITHOUT AND WITH CONTRAST TECHNIQUE: Multiplanar, multiecho pulse sequences of the brain and surrounding structures were obtained without intravenous contrast. Angiographic images of the neck were obtained using MRA technique without and with intravenous contrast. Angiographic images of the head were obtained using MRA technique without intravenous contrast. CONTRAST:  3mL MULTIHANCE GADOBENATE DIMEGLUMINE 529 MG/ML IV SOLN COMPARISON:  None. FINDINGS: MR HEAD FINDINGS Brain: No acute infarction, hemorrhage, hydrocephalus, extra-axial collection or mass lesion. Few nonspecific foci of T2 FLAIR hyperintense signal abnormality in subcortical white matter of unlikely  clinical significance. Vascular: Abnormal signal within the left vertebral artery with associated susceptibility blooming. Otherwise normal flow voids. Skull and upper cervical spine: Normal marrow signal. Sinuses/Orbits: Negative. Other: None. MRA HEAD FINDINGS Internal carotid arteries:  Patent. Anterior cerebral arteries:  Patent. Middle cerebral arteries: Patent. Anterior communicating artery: Patent. Posterior communicating arteries: Patent left. No right identified, likely hypoplastic or absent. Posterior cerebral arteries:  Patent. Basilar artery:  Patent. Vertebral arteries: Patent right. Faint flow related signal is present within the proximal left V4 segment with occlusion of the vessel beyond foramen magnum to the vertebrobasilar junction. This finding corresponds to signal abnormality on MRI of the head and absent enhancement within the V4 segment on MRA of the neck. There is a large left AICA/PICA branch perfusing inferior cerebellum. No evidence of high-grade stenosis, large vessel occlusion, or aneurysm unless noted above. MRA NECK FINDINGS Aortic arch: Patent. Right common carotid artery: Patent. Right internal carotid artery: Patent. Right vertebral artery: Patent. Left common carotid artery: Patent. Left Internal  carotid artery: Patent. Left Vertebral artery: There is enhancement within left V1, V2, and V3 segments the decreases in a gradient fashion downstream compatible with slow antegrade flow. The left V4 segment is occluded from the foramen magnum. There is no evidence of hemodynamically significant stenosis by NASCET criteria, occlusion, or aneurysm unless noted above. IMPRESSION: MRI head: No acute intracranial abnormality. Unremarkable MRI of the brain for age. MRA head: 1. 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 2. Otherwise patent anterior and posterior intracranial circulation. MRA neck: 1. Left V4 occlusion. Patent enhancement of left V1, V2, and V3 segments with gradient decreasing downstream enhancement compatible slow antegrade flow. 2. Widely patent bilateral carotid systems and right vertebral artery. These results were called by telephone at the time of interpretation on 04/24/2017 at 10:40 pm to Dr. Orlie Dakin , who verbally acknowledged these results. Electronically Signed   By: Kristine Garbe M.D.   On: 04/24/2017 22:43   Radiology No results found.  Procedures Procedures (including critical care time)  Medications Ordered in ED Medications - No data to display   Initial Impression / Assessment and Plan / ED Course  I have reviewed the triage vital signs and the nursing notes.  Pertinent labs & imaging results that were available during my care of the patient were reviewed by me and considered in my medical decision making (see chart for details).   10:55 PM patient is asymptomatic no longer has any neck pain.  Vertigo has resolved. I have consulted Dr.Arora requested CTA of head and neck which have ordered.  He will evaluate the patient. Patient signed out to Dr. Tyrone Nine anti-Ms.Pisciotta 11:35 PM   Final Clinical Impressions(s) / ED Diagnoses  Diagnosis #1 vertigo #2 neck pain Final diagnoses:  None  #3 elevated blood pressure  ED  Discharge Orders    None       Orlie Dakin, MD 04/24/17 2337

## 2017-04-24 NOTE — ED Notes (Signed)
Pt returned from MRI, no complaints voiced.  Pt alert and oriented x'3

## 2017-04-24 NOTE — ED Notes (Signed)
Pt requested info about when MRI will occur, contacted MRI, pt is next in queue

## 2017-04-25 ENCOUNTER — Inpatient Hospital Stay (HOSPITAL_COMMUNITY): Payer: Medicare Other

## 2017-04-25 DIAGNOSIS — K219 Gastro-esophageal reflux disease without esophagitis: Secondary | ICD-10-CM | POA: Diagnosis not present

## 2017-04-25 DIAGNOSIS — I10 Essential (primary) hypertension: Secondary | ICD-10-CM

## 2017-04-25 DIAGNOSIS — I6330 Cerebral infarction due to thrombosis of unspecified cerebral artery: Secondary | ICD-10-CM

## 2017-04-25 DIAGNOSIS — R51 Headache: Secondary | ICD-10-CM | POA: Diagnosis not present

## 2017-04-25 DIAGNOSIS — I6502 Occlusion and stenosis of left vertebral artery: Secondary | ICD-10-CM | POA: Diagnosis not present

## 2017-04-25 DIAGNOSIS — E785 Hyperlipidemia, unspecified: Secondary | ICD-10-CM | POA: Diagnosis not present

## 2017-04-25 DIAGNOSIS — F419 Anxiety disorder, unspecified: Secondary | ICD-10-CM | POA: Diagnosis not present

## 2017-04-25 DIAGNOSIS — I639 Cerebral infarction, unspecified: Secondary | ICD-10-CM

## 2017-04-25 DIAGNOSIS — R402413 Glasgow coma scale score 13-15, at hospital admission: Secondary | ICD-10-CM | POA: Diagnosis not present

## 2017-04-25 DIAGNOSIS — R2689 Other abnormalities of gait and mobility: Secondary | ICD-10-CM | POA: Diagnosis not present

## 2017-04-25 DIAGNOSIS — R29700 NIHSS score 0: Secondary | ICD-10-CM | POA: Diagnosis not present

## 2017-04-25 DIAGNOSIS — Z79899 Other long term (current) drug therapy: Secondary | ICD-10-CM | POA: Diagnosis not present

## 2017-04-25 DIAGNOSIS — G450 Vertebro-basilar artery syndrome: Secondary | ICD-10-CM | POA: Diagnosis present

## 2017-04-25 DIAGNOSIS — Z8673 Personal history of transient ischemic attack (TIA), and cerebral infarction without residual deficits: Secondary | ICD-10-CM | POA: Diagnosis present

## 2017-04-25 DIAGNOSIS — I459 Conduction disorder, unspecified: Secondary | ICD-10-CM | POA: Diagnosis not present

## 2017-04-25 DIAGNOSIS — I7774 Dissection of vertebral artery: Secondary | ICD-10-CM | POA: Diagnosis not present

## 2017-04-25 DIAGNOSIS — Z87891 Personal history of nicotine dependence: Secondary | ICD-10-CM | POA: Diagnosis not present

## 2017-04-25 DIAGNOSIS — F329 Major depressive disorder, single episode, unspecified: Secondary | ICD-10-CM | POA: Diagnosis not present

## 2017-04-25 DIAGNOSIS — I6340 Cerebral infarction due to embolism of unspecified cerebral artery: Secondary | ICD-10-CM

## 2017-04-25 LAB — ECHOCARDIOGRAM COMPLETE: HEIGHTINCHES: 68 in

## 2017-04-25 LAB — LIPID PANEL
Cholesterol: 106 mg/dL (ref 0–200)
HDL: 39 mg/dL — ABNORMAL LOW (ref >40)
LDL CALC: 52 mg/dL (ref 0–99)
Total CHOL/HDL Ratio: 2.7 RATIO
Triglycerides: 77 mg/dL (ref <150)
VLDL: 15 mg/dL (ref 0–40)

## 2017-04-25 LAB — HEMOGLOBIN A1C
Hgb A1c MFr Bld: 5.2 % (ref 4.8–5.6)
MEAN PLASMA GLUCOSE: 102.54 mg/dL

## 2017-04-25 MED ORDER — ACETAMINOPHEN 650 MG RE SUPP
650.0000 mg | RECTAL | Status: DC | PRN
Start: 2017-04-25 — End: 2017-04-25

## 2017-04-25 MED ORDER — ASPIRIN 325 MG PO TABS
325.0000 mg | ORAL_TABLET | Freq: Every day | ORAL | Status: DC
  Administered 2017-04-25: 325 mg via ORAL
  Filled 2017-04-25: qty 1

## 2017-04-25 MED ORDER — ACETAMINOPHEN 160 MG/5ML PO SOLN: 650.0000 mg | ORAL | Status: DC | PRN

## 2017-04-25 MED ORDER — CLOPIDOGREL BISULFATE 75 MG PO TABS: 75.0000 mg | ORAL_TABLET | Freq: Every day | ORAL | Status: DC

## 2017-04-25 MED ORDER — BUPROPION HCL ER (XL) 150 MG PO TB24
300.0000 mg | ORAL_TABLET | Freq: Every day | ORAL | Status: DC
  Administered 2017-04-25: 300 mg via ORAL
  Filled 2017-04-25: qty 2

## 2017-04-25 MED ORDER — ASPIRIN 300 MG RE SUPP: 300.0000 mg | Freq: Every day | RECTAL | Status: DC

## 2017-04-25 MED ORDER — ACETAMINOPHEN 325 MG PO TABS: 650.0000 mg | ORAL_TABLET | ORAL | Status: DC | PRN

## 2017-04-25 MED ORDER — ATORVASTATIN CALCIUM 80 MG PO TABS: 80.0000 mg | ORAL_TABLET | Freq: Every day | ORAL | 1 refills | Status: DC

## 2017-04-25 MED ORDER — POLYVINYL ALCOHOL 1.4 % OP SOLN: 1.0000 [drp] | OPHTHALMIC | Status: DC | PRN

## 2017-04-25 MED ORDER — BUSPIRONE HCL 15 MG PO TABS
15.0000 mg | ORAL_TABLET | Freq: Two times a day (BID) | ORAL | Status: DC
  Administered 2017-04-25 (×2): 15 mg via ORAL
  Filled 2017-04-25 (×3): qty 1
  Filled 2017-04-25: qty 3

## 2017-04-25 MED ORDER — CLOPIDOGREL BISULFATE 75 MG PO TABS
300.0000 mg | ORAL_TABLET | Freq: Once | ORAL | Status: AC
  Administered 2017-04-25: 300 mg via ORAL
  Filled 2017-04-25: qty 4

## 2017-04-25 MED ORDER — BREXPIPRAZOLE 2 MG PO TABS: 2.0000 mg | ORAL_TABLET | Freq: Every day | ORAL | Status: DC

## 2017-04-25 MED ORDER — CLOPIDOGREL BISULFATE 75 MG PO TABS: 75.0000 mg | ORAL_TABLET | Freq: Every day | ORAL | 1 refills | Status: DC

## 2017-04-25 MED ORDER — ENOXAPARIN SODIUM 40 MG/0.4ML ~~LOC~~ SOLN: 40.0000 mg | SUBCUTANEOUS | Status: DC

## 2017-04-25 MED ORDER — BREXPIPRAZOLE 2 MG PO TABS
2.0000 mg | ORAL_TABLET | Freq: Every day | ORAL | Status: DC
  Administered 2017-04-25: 2 mg via ORAL
  Filled 2017-04-25 (×2): qty 1

## 2017-04-25 MED ORDER — BUPROPION HCL ER (XL) 150 MG PO TB24
150.0000 mg | ORAL_TABLET | Freq: Every day | ORAL | Status: DC
  Filled 2017-04-25: qty 1

## 2017-04-25 MED ORDER — ATORVASTATIN CALCIUM 20 MG PO TABS: 20.0000 mg | ORAL_TABLET | Freq: Every day | ORAL | Status: DC

## 2017-04-25 MED ORDER — ATORVASTATIN CALCIUM 80 MG PO TABS
80.0000 mg | ORAL_TABLET | Freq: Every day | ORAL | Status: DC
  Administered 2017-04-25: 80 mg via ORAL
  Filled 2017-04-25: qty 1

## 2017-04-25 MED ORDER — PANTOPRAZOLE SODIUM 40 MG PO TBEC: 80.0000 mg | DELAYED_RELEASE_TABLET | Freq: Every day | ORAL | Status: DC

## 2017-04-25 MED ORDER — ASPIRIN 325 MG PO TABS: 325.0000 mg | ORAL_TABLET | Freq: Every day | ORAL | 1 refills | Status: DC

## 2017-04-25 MED ORDER — PANTOPRAZOLE SODIUM 40 MG PO TBEC
40.0000 mg | DELAYED_RELEASE_TABLET | Freq: Every day | ORAL | Status: DC
  Administered 2017-04-25: 40 mg via ORAL
  Filled 2017-04-25: qty 1

## 2017-04-25 MED ORDER — STROKE: EARLY STAGES OF RECOVERY BOOK
Freq: Once | Status: AC
  Administered 2017-04-25: 06:00:00
  Filled 2017-04-25: qty 1

## 2017-04-25 MED ORDER — BUSPIRONE HCL 10 MG PO TABS: 15.0000 mg | ORAL_TABLET | Freq: Every day | ORAL | Status: DC

## 2017-04-25 NOTE — Progress Notes (Signed)
OT Cancellation Note  Patient Details Name: Edward Zavala MRN: [id number] DOB: 1945/11/12   Cancelled Treatment:    Reason Eval/Treat Not Completed: OT screened, no needs identified, will sign off. Pt independent with PT; PT reporting no acute OT needs identified. Will screen and sign off at this time. Please re-consult if needs change. Thank you for this referral.  Binnie Kand M.S., OTR/L Pager: [phone]  04/25/2017, 4:47 PM

## 2017-04-25 NOTE — Progress Notes (Signed)
  Echocardiogram 2D Echocardiogram has been performed.  Jennette Dubin 04/25/2017, 3:33 PM

## 2017-04-25 NOTE — Discharge Summary (Signed)
Physician Discharge Summary  Edward Zavala TOI:[id number] DOB: 1945-04-18 DOA: 04/24/2017  PCP: Kelton Pillar, MD  Admit date: 04/24/2017 Discharge date: 04/25/2017  Admitted From: Home Disposition: Home  Recommendations for Outpatient Follow-up:  1. Follow up with PCP in 1-2 weeks 2. Please obtain BMP/CBC in one week 3. Please follow-up with neurology in 1-2 weeks.  Home Health: No Equipment/Devices: None  Discharge Condition: Stable CODE STATUS: Full Diet recommendation: Heart healthy  Brief/Interim Summary:  #) Vertebral artery dissection or thrombus on left: MRA and CTA of the head and neck showed concern for vertebral artery dissection on the left versus thrombus.  It was felt the patient's vertigo and instability were secondary to this.  It was also felt the patient did not have posterior circulation strokes on MRI because he had either a chronic occlusion or good collateralization in the setting of an acute thrombus/dissection.  Stroke workup was completed.  Patient was started on aspirin 325 mg daily and clopidogrel 75 mg daily.  IMA globin A1c was 5.2.  His lipid panel was notable for a total cholesterol of 106, and LDL of 52.  Patient was started on atorvastatin 80 mg daily.  An echo from 04/25/2017 is pending.  He was given outpatient follow-up with neurology.  #) Hypertension: Patient was allowed permissive hypertension for 24 hours after symptoms.  Patient was discharged and told to restart home blood pressure medications and follow-up with PCP.  #) Psych: Patient was continued on home bupropion 300 mg before breakfast, buspirone 15 mg daily, and brexpiprazole 2 mg daily.  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 Thanks.   Call MD for:  persistant dizziness or light-headedness   Complete by:  As directed    Call MD for:  temperature >100.4   Complete by:  As directed    Diet - low sodium heart healthy   Complete by:  As directed    Discharge instructions   Complete by:  As directed    Please follow-up with your primary care doctor in 1-2 weeks.  Please follow-up with neurology in 1-2 weeks.  Please call and make an appointment if you do not hear from neurology in 1 week.   Increase activity slowly   Complete by:  As directed      Allergies as of 04/25/2017   No Known Allergies     Medication List    STOP taking these medications   lisinopril-hydrochlorothiazide 20-12.5 MG tablet Commonly known as:  PRINZIDE,ZESTORETIC     TAKE these medications   aspirin 325 MG tablet Take 1 tablet (325 mg total) by mouth daily. Start taking on:  04/26/2017   aspirin-acetaminophen-caffeine 250-250-65 MG tablet Commonly known as:  EXCEDRIN MIGRAINE Take 1 tablet by mouth every 6 (six) hours as needed. Headache   atorvastatin 80 MG tablet Commonly known as:  LIPITOR Take 1 tablet (80 mg total) by mouth daily. Start taking on:  04/26/2017 What changed:    medication strength  how much  to take   buPROPion 150 MG 24 hr tablet Commonly known as:  WELLBUTRIN XL Take 300 mg by mouth daily before breakfast.   busPIRone 15 MG tablet Commonly known as:  BUSPAR Take 15 mg by mouth daily.   butalbital-aspirin-caffeine 50-325-40 MG capsule Commonly known as:  FIORINAL Take 1 capsule by mouth as needed. Headache   clopidogrel 75 MG tablet Commonly known as:  PLAVIX Take 1 tablet (75 mg total) by mouth daily. Start taking on:  04/26/2017    hydroxypropyl methylcellulose / hypromellose 2.5 % ophthalmic solution Commonly known as:  ISOPTO TEARS / GONIOVISC Place 1 drop into both eyes as needed. Dry eyes   lisinopril 40 MG tablet Commonly known as:  PRINIVIL,ZESTRIL Take 40 mg by mouth daily.   omeprazole 40 MG capsule Commonly known as:  PRILOSEC Take 40 mg by mouth daily.   REXULTI 2 MG Tabs Generic drug:  Brexpiprazole Take 2 mg by mouth daily.      Follow-up Information    Venancio Poisson, NP. Schedule an appointment as soon as possible for a visit in 4 week(s).   Specialty:  Nurse Practitioner Contact information: [address] [place]  [id number] [phone]          No Known Allergies  Consultations:  Neurology   Procedures/Studies: Ct Angio Head W Or Wo Contrast  Result Date: 04/25/2017 CLINICAL DATA:  72 y/o  M; left vertebral artery abnormality on MRI. EXAM: CT ANGIOGRAPHY HEAD AND NECK TECHNIQUE: Multidetector CT imaging of the head and neck was performed using the standard protocol during bolus administration of intravenous contrast. Multiplanar CT image reconstructions and MIPs were obtained to evaluate the vascular anatomy. Carotid stenosis measurements (when applicable) are obtained utilizing NASCET criteria, using the distal internal carotid diameter as the denominator. COMPARISON:  MRA head and neck dated 04/24/2016. FINDINGS: CT HEAD FINDINGS Brain: No evidence of acute infarction, hemorrhage, hydrocephalus, extra-axial collection or mass lesion/mass effect. Vascular: As below. Skull: Normal. Negative for fracture or focal lesion. Sinuses: Imaged portions are clear. Orbits: No acute finding. Review of the MIP images confirms the above findings CTA NECK FINDINGS Aortic arch: Bovine variant branching. Imaged portion shows no evidence of aneurysm or dissection. No significant stenosis of the major arch vessel origins. Mild calcific atherosclerosis of the arch. Fibrofatty plaque of left  subclavian artery origin with minimal less than 30% stenosis. Right carotid system: No evidence of dissection, stenosis (50% or greater) or occlusion. Minimal calcific atherosclerosis of carotid bifurcation. Left carotid system: No evidence of dissection, stenosis (50% or greater) or occlusion. Minimal calcific atherosclerosis of carotid bifurcation. Vertebral arteries: Right dominant. No evidence of dissection, stenosis (50% or greater) or occlusion of right vertebral artery. Patent left V1 and V2 segments with diminishing antegrade enhancement of the left V3 segment, no appreciable enhancement of the V3 just upstream to foramen magnum, no enhancement of left V4 segment. Susceptibility blooming is present within the left V4 segment on prior MRI indicating obstructing thrombus or dissection and findings on CTA are likely due to slow antegrade flow from the obstruction. Skeleton: No acute finding. Moderate cervical spondylosis with multilevel disc and facet degenerative changes. No high-grade bony canal stenosis. Uncovertebral and facet hypertrophy result in right-sided C4-C7 neural foraminal encroachment and left-sided C4-5 and C6-7 neural foraminal encroachment. Other neck: Negative. Upper chest: The negative. Review of the MIP images confirms the above findings CTA HEAD FINDINGS Anterior circulation: No significant stenosis, proximal occlusion, aneurysm, or vascular malformation. Mild non stenotic calcific  atherosclerosis of carotid siphons. Posterior circulation: Patent left V1 and V2 segments with diminishing antegrade enhancement of the left V3 segment, no appreciable enhancement of V3 just upstream to foramen magnum, no enhancement of left V4 segment. Susceptibility blooming is present within the left V4 segment on prior MRI indicating obstructing thrombus or dissection and findings on CTA are likely due to slow antegrade flow from the obstruction. Findings on MRA of the neck with and without contrast are  similar given differences in technique. Otherwise no significant stenosis, proximal occlusion, aneurysm, or vascular malformation of the posterior circulation. Venous sinuses: As permitted by contrast timing, patent. Anatomic variants: Anterior communicating artery and left posterior communicating artery. No right posterior communicating artery identified, likely hypoplastic or absent. Delayed phase: No abnormal intracranial enhancement. Review of the MIP images confirms the above findings IMPRESSION: 1. Negative CT of the head. 2. Patent left V1 and V2 segments with diminishing downstream enhancement of the left V3 segment, no appreciable enhancement of V3 just upstream to foramen magnum, no enhancement of left V4 segment. Susceptibility blooming is present within the left V4 segment on prior MRI indicating thrombus or dissection. Diminishing downstream enhancement on both CTA and MRA within distal left V2 and V3 segments is likely due to slow antegrade flow from the obstructing left V4 lesion. 3. Otherwise patent patent bilateral carotid and right vertebral artery. No dissection, aneurysm, or hemodynamically significant stenosis utilizing NASCET criteria. 4. Otherwise patent anterior and posterior intracranial circulation. No large vessel occlusion, aneurysm, or significant stenosis. Electronically Signed   By: Kristine Garbe M.D.   On: 04/25/2017 00:39   Dg Chest 2 View  Result Date: 04/25/2017 CLINICAL DATA:  Transient ischemic attack involving the vertebrobasilar circulation. EXAM: CHEST - 2 VIEW COMPARISON:  Chest radiograph performed 11/03/2010 FINDINGS: The lungs are well-aerated and clear. There is no evidence of focal opacification, pleural effusion or pneumothorax. The heart is normal in size; the mediastinal contour is within normal limits. No acute osseous abnormalities are seen. Mild degenerative widening is noted at the acromioclavicular joints. IMPRESSION: No acute cardiopulmonary  process seen. Electronically Signed   By: Garald Balding M.D.   On: 04/25/2017 06:40   Ct Angio Neck W And/or Wo Contrast  Result Date: 04/25/2017 CLINICAL DATA:  72 y/o  M; left vertebral artery abnormality on MRI. EXAM: CT ANGIOGRAPHY HEAD AND NECK TECHNIQUE: Multidetector CT imaging of the head and neck was performed using the standard protocol during bolus administration of intravenous contrast. Multiplanar CT image reconstructions and MIPs were obtained to evaluate the vascular anatomy. Carotid stenosis measurements (when applicable) are obtained utilizing NASCET criteria, using the distal internal carotid diameter as the denominator. COMPARISON:  MRA head and neck dated 04/24/2016. FINDINGS: CT HEAD FINDINGS Brain: No evidence of acute infarction, hemorrhage, hydrocephalus, extra-axial collection or mass lesion/mass effect. Vascular: As below. Skull: Normal. Negative for fracture or focal lesion. Sinuses: Imaged portions are clear. Orbits: No acute finding. Review of the MIP images confirms the above findings CTA NECK FINDINGS Aortic arch: Bovine variant branching. Imaged portion shows no evidence of aneurysm or dissection. No significant stenosis of the major arch vessel origins. Mild calcific atherosclerosis of the arch. Fibrofatty plaque of left subclavian artery origin with minimal less than 30% stenosis. Right carotid system: No evidence of dissection, stenosis (50% or greater) or occlusion. Minimal calcific atherosclerosis of carotid bifurcation. Left carotid system: No evidence of dissection, stenosis (50% or greater) or occlusion. Minimal calcific atherosclerosis of carotid bifurcation. Vertebral arteries: Right dominant.  No evidence of dissection, stenosis (50% or greater) or occlusion of right vertebral artery. Patent left V1 and V2 segments with diminishing antegrade enhancement of the left V3 segment, no appreciable enhancement of the V3 just upstream to foramen magnum, no enhancement of left  V4 segment. Susceptibility blooming is present within the left V4 segment on prior MRI indicating obstructing thrombus or dissection and findings on CTA are likely due to slow antegrade flow from the obstruction. Skeleton: No acute finding. Moderate cervical spondylosis with multilevel disc and facet degenerative changes. No high-grade bony canal stenosis. Uncovertebral and facet hypertrophy result in right-sided C4-C7 neural foraminal encroachment and left-sided C4-5 and C6-7 neural foraminal encroachment. Other neck: Negative. Upper chest: The negative. Review of the MIP images confirms the above findings CTA HEAD FINDINGS Anterior circulation: No significant stenosis, proximal occlusion, aneurysm, or vascular malformation. Mild non stenotic calcific atherosclerosis of carotid siphons. Posterior circulation: Patent left V1 and V2 segments with diminishing antegrade enhancement of the left V3 segment, no appreciable enhancement of V3 just upstream to foramen magnum, no enhancement of left V4 segment. Susceptibility blooming is present within the left V4 segment on prior MRI indicating obstructing thrombus or dissection and findings on CTA are likely due to slow antegrade flow from the obstruction. Findings on MRA of the neck with and without contrast are similar given differences in technique. Otherwise no significant stenosis, proximal occlusion, aneurysm, or vascular malformation of the posterior circulation. Venous sinuses: As permitted by contrast timing, patent. Anatomic variants: Anterior communicating artery and left posterior communicating artery. No right posterior communicating artery identified, likely hypoplastic or absent. Delayed phase: No abnormal intracranial enhancement. Review of the MIP images confirms the above findings IMPRESSION: 1. Negative CT of the head. 2. Patent left V1 and V2 segments with diminishing downstream enhancement of the left V3 segment, no appreciable enhancement of V3 just  upstream to foramen magnum, no enhancement of left V4 segment. Susceptibility blooming is present within the left V4 segment on prior MRI indicating thrombus or dissection. Diminishing downstream enhancement on both CTA and MRA within distal left V2 and V3 segments is likely due to slow antegrade flow from the obstructing left V4 lesion. 3. Otherwise patent patent bilateral carotid and right vertebral artery. No dissection, aneurysm, or hemodynamically significant stenosis utilizing NASCET criteria. 4. Otherwise patent anterior and posterior intracranial circulation. No large vessel occlusion, aneurysm, or significant stenosis. Electronically Signed   By: Kristine Garbe M.D.   On: 04/25/2017 00:39   Mr Angiogram Head Wo Contrast  Result Date: 04/24/2017 CLINICAL DATA:  72 y/o M; sudden dizziness. Ataxia, stroke suspected. EXAM: MR HEAD WITHOUT CONTRAST MRA HEAD WITHOUT CONTRAST MRA OF THE NECK WITHOUT AND WITH CONTRAST TECHNIQUE: Multiplanar, multiecho pulse sequences of the brain and surrounding structures were obtained without intravenous contrast. Angiographic images of the neck were obtained using MRA technique without and with intravenous contrast. Angiographic images of the head were obtained using MRA technique without intravenous contrast. CONTRAST:  61mL MULTIHANCE GADOBENATE DIMEGLUMINE 529 MG/ML IV SOLN COMPARISON:  None. FINDINGS: MR HEAD FINDINGS Brain: No acute infarction, hemorrhage, hydrocephalus, extra-axial collection or mass lesion. Few nonspecific foci of T2 FLAIR hyperintense signal abnormality in subcortical white matter of unlikely clinical significance. Vascular: Abnormal signal within the left vertebral artery with associated susceptibility blooming. Otherwise normal flow voids. Skull and upper cervical spine: Normal marrow signal. Sinuses/Orbits: Negative. Other: None. MRA HEAD FINDINGS Internal carotid arteries:  Patent. Anterior cerebral arteries:  Patent. Middle cerebral  arteries: Patent. Anterior  communicating artery: Patent. Posterior communicating arteries: Patent left. No right identified, likely hypoplastic or absent. Posterior cerebral arteries:  Patent. Basilar artery:  Patent. Vertebral arteries: Patent right. Faint flow related signal is present within the proximal left V4 segment with occlusion of the vessel beyond foramen magnum to the vertebrobasilar junction. This finding corresponds to signal abnormality on MRI of the head and absent enhancement within the V4 segment on MRA of the neck. There is a large left AICA/PICA branch perfusing inferior cerebellum. No evidence of high-grade stenosis, large vessel occlusion, or aneurysm unless noted above. MRA NECK FINDINGS Aortic arch: Patent. Right common carotid artery: Patent. Right internal carotid artery: Patent. Right vertebral artery: Patent. Left common carotid artery: Patent. Left Internal carotid artery: Patent. Left Vertebral artery: There is enhancement within left V1, V2, and V3 segments the decreases in a gradient fashion downstream compatible with slow antegrade flow. The left V4 segment is occluded from the foramen magnum. There is no evidence of hemodynamically significant stenosis by NASCET criteria, occlusion, or aneurysm unless noted above. IMPRESSION: MRI head: No acute intracranial abnormality. Unremarkable MRI of the brain for age. MRA head: 1. 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 2. Otherwise patent anterior and posterior intracranial circulation. MRA neck: 1. Left V4 occlusion. Patent enhancement of left V1, V2, and V3 segments with gradient decreasing downstream enhancement compatible slow antegrade flow. 2. Widely patent bilateral carotid systems and right vertebral artery. These results were called by telephone at the time of interpretation on 04/24/2017 at 10:40 pm to Dr. Orlie Dakin , who verbally acknowledged these results. Electronically Signed   By: Kristine Garbe M.D.   On: 04/24/2017 22:43   Mr Angiogram Neck W Or Wo Contrast  Result Date: 04/24/2017 CLINICAL DATA:  72 y/o M; sudden dizziness. Ataxia, stroke suspected. EXAM: MR HEAD WITHOUT CONTRAST MRA HEAD WITHOUT CONTRAST MRA OF THE NECK WITHOUT AND WITH CONTRAST TECHNIQUE: Multiplanar, multiecho pulse sequences of the brain and surrounding structures were obtained without intravenous contrast. Angiographic images of the neck were obtained using MRA technique without and with intravenous contrast. Angiographic images of the head were obtained using MRA technique without intravenous contrast. CONTRAST:  83mL MULTIHANCE GADOBENATE DIMEGLUMINE 529 MG/ML IV SOLN COMPARISON:  None. FINDINGS: MR HEAD FINDINGS Brain: No acute infarction, hemorrhage, hydrocephalus, extra-axial collection or mass lesion. Few nonspecific foci of T2 FLAIR hyperintense signal abnormality in subcortical white matter of unlikely clinical significance. Vascular: Abnormal signal within the left vertebral artery with associated susceptibility blooming. Otherwise normal flow voids. Skull and upper cervical spine: Normal marrow signal. Sinuses/Orbits: Negative. Other: None. MRA HEAD FINDINGS Internal carotid arteries:  Patent. Anterior cerebral arteries:  Patent. Middle cerebral arteries: Patent. Anterior communicating artery: Patent. Posterior communicating arteries: Patent left. No right identified, likely hypoplastic or absent. Posterior cerebral arteries:  Patent. Basilar artery:  Patent. Vertebral arteries: Patent right. Faint flow related signal is present within the proximal left V4 segment with occlusion of the vessel beyond foramen magnum to the vertebrobasilar junction. This finding corresponds to signal abnormality on MRI of the head and absent enhancement within the V4 segment on MRA of the neck. There is a large left AICA/PICA branch perfusing inferior cerebellum. No evidence of high-grade stenosis, large vessel occlusion,  or aneurysm unless noted above. MRA NECK FINDINGS Aortic arch: Patent. Right common carotid artery: Patent. Right internal carotid artery: Patent. Right vertebral artery: Patent. Left common carotid artery: Patent. Left Internal carotid artery: Patent. Left Vertebral artery: There is enhancement within  left V1, V2, and V3 segments the decreases in a gradient fashion downstream compatible with slow antegrade flow. The left V4 segment is occluded from the foramen magnum. There is no evidence of hemodynamically significant stenosis by NASCET criteria, occlusion, or aneurysm unless noted above. IMPRESSION: MRI head: No acute intracranial abnormality. Unremarkable MRI of the brain for age. MRA head: 1. 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 2. Otherwise patent anterior and posterior intracranial circulation. MRA neck: 1. Left V4 occlusion. Patent enhancement of left V1, V2, and V3 segments with gradient decreasing downstream enhancement compatible slow antegrade flow. 2. Widely patent bilateral carotid systems and right vertebral artery. These results were called by telephone at the time of interpretation on 04/24/2017 at 10:40 pm to Dr. Orlie Dakin , who verbally acknowledged these results. Electronically Signed   By: Kristine Garbe M.D.   On: 04/24/2017 22:43   Mr Brain Wo Contrast  Result Date: 04/24/2017 CLINICAL DATA:  72 y/o M; sudden dizziness. Ataxia, stroke suspected. EXAM: MR HEAD WITHOUT CONTRAST MRA HEAD WITHOUT CONTRAST MRA OF THE NECK WITHOUT AND WITH CONTRAST TECHNIQUE: Multiplanar, multiecho pulse sequences of the brain and surrounding structures were obtained without intravenous contrast. Angiographic images of the neck were obtained using MRA technique without and with intravenous contrast. Angiographic images of the head were obtained using MRA technique without intravenous contrast. CONTRAST:  24mL MULTIHANCE GADOBENATE DIMEGLUMINE 529 MG/ML IV SOLN  COMPARISON:  None. FINDINGS: MR HEAD FINDINGS Brain: No acute infarction, hemorrhage, hydrocephalus, extra-axial collection or mass lesion. Few nonspecific foci of T2 FLAIR hyperintense signal abnormality in subcortical white matter of unlikely clinical significance. Vascular: Abnormal signal within the left vertebral artery with associated susceptibility blooming. Otherwise normal flow voids. Skull and upper cervical spine: Normal marrow signal. Sinuses/Orbits: Negative. Other: None. MRA HEAD FINDINGS Internal carotid arteries:  Patent. Anterior cerebral arteries:  Patent. Middle cerebral arteries: Patent. Anterior communicating artery: Patent. Posterior communicating arteries: Patent left. No right identified, likely hypoplastic or absent. Posterior cerebral arteries:  Patent. Basilar artery:  Patent. Vertebral arteries: Patent right. Faint flow related signal is present within the proximal left V4 segment with occlusion of the vessel beyond foramen magnum to the vertebrobasilar junction. This finding corresponds to signal abnormality on MRI of the head and absent enhancement within the V4 segment on MRA of the neck. There is a large left AICA/PICA branch perfusing inferior cerebellum. No evidence of high-grade stenosis, large vessel occlusion, or aneurysm unless noted above. MRA NECK FINDINGS Aortic arch: Patent. Right common carotid artery: Patent. Right internal carotid artery: Patent. Right vertebral artery: Patent. Left common carotid artery: Patent. Left Internal carotid artery: Patent. Left Vertebral artery: There is enhancement within left V1, V2, and V3 segments the decreases in a gradient fashion downstream compatible with slow antegrade flow. The left V4 segment is occluded from the foramen magnum. There is no evidence of hemodynamically significant stenosis by NASCET criteria, occlusion, or aneurysm unless noted above. IMPRESSION: MRI head: No acute intracranial abnormality. Unremarkable MRI of the  brain for age. MRA head: 1. 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 2. Otherwise patent anterior and posterior intracranial circulation. MRA neck: 1. Left V4 occlusion. Patent enhancement of left V1, V2, and V3 segments with gradient decreasing downstream enhancement compatible slow antegrade flow. 2. Widely patent bilateral carotid systems and right vertebral artery. These results were called by telephone at the time of interpretation on 04/24/2017 at 10:40 pm to Dr. Orlie Dakin , who verbally acknowledged these results.  Electronically Signed   By: Kristine Garbe M.D.   On: 04/24/2017 22:43   Echo 04/25/2017 pending final results   Subjective:   Discharge Exam: Vitals:   04/25/17 1153 04/25/17 1531  BP: (!) 144/98 (!) 156/91  Pulse: 84 (!) 104  Resp:  17  Temp:  98.4 F (36.9 C)  SpO2: 96% 94%   Vitals:   04/25/17 0748 04/25/17 0930 04/25/17 1153 04/25/17 1531  BP: (!) 148/107 (!) 168/92 (!) 144/98 (!) 156/91  Pulse: 85 88 84 (!) 104  Resp: 16 16  17   Temp: 97.7 F (36.5 C)   98.4 F (36.9 C)  TempSrc: Oral   Oral  SpO2: 99% 97% 96% 94%  Height:        General: Pt is alert, awake, not in acute distress Cardiovascular: RRR, no murmurs Respiratory: CTA bilaterally, no wheezing, no rhonchi Abdominal: Soft, NT, ND, bowel sounds + Extremities: no edema Neuro: Alert and oriented x4.  Pupils equally round and reactive to light, cranial nerves II through XII intact.  Bilateral upper extremity strength 5 out of 5 and lower extremity strength 5 out of 5.  Sensation intact throughout.  Finger to nose, rapid alternating movements, heel to shin all unremarkable.    The results of significant diagnostics from this hospitalization (including imaging, microbiology, ancillary and laboratory) are listed below for reference.     Microbiology: No results found for this or any previous visit (from the past 240 hour(s)).   Labs: BNP (last 3 results) No  results for input(s): BNP in the last 8760 hours. Basic Metabolic Panel: Recent Labs  Lab 04/24/17 1620  NA 141  K 3.8  CL 107  CO2 24  GLUCOSE 105*  BUN 13  CREATININE 1.21  CALCIUM 9.3   Liver Function Tests: No results for input(s): AST, ALT, ALKPHOS, BILITOT, PROT, ALBUMIN in the last 168 hours. No results for input(s): LIPASE, AMYLASE in the last 168 hours. No results for input(s): AMMONIA in the last 168 hours. CBC: Recent Labs  Lab 04/24/17 1620  WBC 8.1  HGB 15.6  HCT 46.1  MCV 93.7  PLT 246   Cardiac Enzymes: No results for input(s): CKTOTAL, CKMB, CKMBINDEX, TROPONINI in the last 168 hours. BNP: Invalid input(s): POCBNP CBG: Recent Labs  Lab 04/24/17 1612  GLUCAP 97   D-Dimer No results for input(s): DDIMER in the last 72 hours. Hgb A1c Recent Labs    04/25/17 0619  HGBA1C 5.2   Lipid Profile Recent Labs    04/25/17 0619  CHOL 106  HDL 39*  LDLCALC 52  TRIG 77  CHOLHDL 2.7   Thyroid function studies No results for input(s): TSH, T4TOTAL, T3FREE, THYROIDAB in the last 72 hours.  Invalid input(s): FREET3 Anemia work up No results for input(s): VITAMINB12, FOLATE, FERRITIN, TIBC, IRON, RETICCTPCT in the last 72 hours. Urinalysis    Component Value Date/Time   COLORURINE YELLOW 04/24/2017 1822   APPEARANCEUR CLEAR 04/24/2017 1822   LABSPEC 1.016 04/24/2017 1822   PHURINE 7.0 04/24/2017 1822   GLUCOSEU NEGATIVE 04/24/2017 1822   HGBUR SMALL (A) 04/24/2017 1822   BILIRUBINUR NEGATIVE 04/24/2017 1822   KETONESUR NEGATIVE 04/24/2017 1822   PROTEINUR NEGATIVE 04/24/2017 1822   UROBILINOGEN 1.0 02/20/2009 1644   NITRITE NEGATIVE 04/24/2017 1822   LEUKOCYTESUR NEGATIVE 04/24/2017 1822   Sepsis Labs Invalid input(s): PROCALCITONIN,  WBC,  LACTICIDVEN Microbiology No results found for this or any previous visit (from the past 240 hour(s)).   Time coordinating discharge:  Over 30 minutes  SIGNED:   Cristy Folks, MD  [facility] 04/25/2017, 4:38 PM Pager   If 7PM-7AM, please contact night-coverage [email or web address] Password [email or web address]

## 2017-04-25 NOTE — Consult Note (Addendum)
Neurology Consultation  Reason for Consult: Dizziness, MRA abnormality Referring Physician: Dr. Winfred Leeds  CC: Dizziness  History is obtained from: Patient chart  HPI: Edward Zavala is a 72 y.o. male past medical history of anxiety, depression, hypertension, hyperlipidemia, who was in his usual state of health until 10 AM on 04/24/2017 when he was driving on his way [place] from the beach when he noticed sudden onset of dizziness and left-sided neck pain.  He had to sit down at a restroom and be helped by people as he was very off balance and could not get into his car. Wife reports similar symptoms happened for a shorter duration last week as well. There is no history of focal weakness, facial droop or dysarthria per his wife who was with him at all times during this episode. Patient denies any vision changes headaches or nausea vomiting. He said that the dizziness was as if his head was spinning and he could not focus on anything that he was seen.  There was no exacerbating or relieving factors.  No position made it better or worse. Associated symptoms were that of gait imbalance and instability. The symptoms have gradually lessened in severity and are now completely resolved. He got a brain MRI which did not reveal any acute abnormality including a stroke or bleed but he also got a MRA of the brain which was concerning for a thrombus versus dissection in the left V4 segment of the vertebral artery.  This was discussed with the radiologist in detail following which a CT angiogram of the head and neck was performed to further evaluate this area of abnormality.  Please see the detailed discussion below. The patient is currently asymptomatic and back to baseline. Also of note, on presentation, his blood pressures were elevated with systolics in the 607P.  LKW: 10 AM on 04/24/2017 tpa given?: no, outside the window Premorbid modified Rankin scale (mRS): 0  ROS: Review of systems performed and negative  except as noted in HPI.  Past Medical History:  Diagnosis Date  . Anxiety   . Depression   . Dyslipidemia (high LDL; low HDL)   . GERD (gastroesophageal reflux disease)   . H/O acute pancreatitis   . H/O gastroesophageal reflux (GERD)   . Headache(784.0)    occasinal   . Hypertension     Family History  Problem Relation Age of Onset  . Cancer Mother 38       Breast Cancer   Social History:   reports that he quit smoking about 40 years ago. His smoking use included cigarettes. he has never used smokeless tobacco. He reports that he drinks about 2.4 oz of alcohol per week. He reports that he does not use drugs.  Medications  Current Facility-Administered Medications:  .  iopamidol (ISOVUE-370) 76 % injection, , , ,   Current Outpatient Medications:  .  aspirin-acetaminophen-caffeine (EXCEDRIN MIGRAINE) 710-626-94 MG per tablet, Take 1 tablet by mouth every 6 (six) hours as needed. Headache, Disp: , Rfl:  .  atorvastatin (LIPITOR) 20 MG tablet, Take 20 mg by mouth daily., Disp: , Rfl:  .  buPROPion (WELLBUTRIN XL) 150 MG 24 hr tablet, Take 150 mg by mouth daily before breakfast. , Disp: , Rfl:  .  busPIRone (BUSPAR) 15 MG tablet, Take 15 mg by mouth daily., Disp: , Rfl:  .  butalbital-aspirin-caffeine (FIORINAL) 50-325-40 MG per capsule, Take 1 capsule by mouth as needed. Headache, Disp: , Rfl:  .  hydroxypropyl methylcellulose (ISOPTO TEARS) 2.5 %  ophthalmic solution, Place 1 drop into both eyes as needed. Dry eyes, Disp: , Rfl:  .  lisinopril (PRINIVIL,ZESTRIL) 40 MG tablet, Take 40 mg by mouth daily., Disp: , Rfl:  .  lisinopril-hydrochlorothiazide (PRINZIDE,ZESTORETIC) 20-12.5 MG per tablet, Take 1 tablet by mouth daily before breakfast. , Disp: , Rfl:  .  omeprazole (PRILOSEC) 40 MG capsule, Take 40 mg by mouth daily. , Disp: , Rfl:  .  REXULTI 2 MG TABS, Take 2 mg by mouth daily., Disp: , Rfl:  .  HYDROcodone-acetaminophen (NORCO/VICODIN) 5-325 MG per tablet, Take 1-2  tablets by mouth every 4 (four) hours as needed for pain. (Patient not taking: Reported on 04/24/2017), Disp: 30 tablet, Rfl: 1  Exam: Current vital signs: BP (!) 149/103   Pulse 85   Temp 98.5 F (36.9 C) (Oral)   Resp 14   SpO2 96%  Vital signs in last 24 hours: Temp:  [98.5 F (36.9 C)] 98.5 F (36.9 C) (03/09 1618) Pulse Rate:  [80-109] 85 (03/09 2330) Resp:  [14-23] 14 (03/09 2330) BP: (141-198)/(99-133) 149/103 (03/09 2330) SpO2:  [94 %-98 %] 96 % (03/09 2330)  GENERAL: Awake, alert in NAD HEENT: - Normocephalic and atraumatic, dry mm, no LN++, no Thyromegally LUNGS - Clear to auscultation bilaterally with no wheezes CV - S1S2 RRR, no m/r/g, equal pulses bilaterally. ABDOMEN - Soft, nontender, nondistended with normoactive BS Ext: warm, well perfused, intact peripheral pulses, no edema  NEURO:  Mental Status: AA&Ox3  Language: speech is not dysarthric.  Naming, repetition, fluency, and comprehension intact. Cranial Nerves: PERRL, right eye exotropia but intact extraocular movements, visual fields full, no facial asymmetry, facial sensation intact, hearing intact, tongue/uvula/soft palate midline, normal sternocleidomastoid and trapezius muscle strength. No evidence of tongue atrophy or fibrillations Motor: 5/5 all over with no vertical drift. Tone: is normal and bulk is normal Sensation- Intact to light touch bilaterally Coordination: FTN intact bilaterally, no ataxia in BLE. Gait- deferred NIH stroke scale-0  Labs I have reviewed labs in epic and the results pertinent to this consultation are: CBC    Component Value Date/Time   WBC 8.1 04/24/2017 1620   RBC 4.92 04/24/2017 1620   HGB 15.6 04/24/2017 1620   HCT 46.1 04/24/2017 1620   PLT 246 04/24/2017 1620   MCV 93.7 04/24/2017 1620   MCH 31.7 04/24/2017 1620   MCHC 33.8 04/24/2017 1620   RDW 12.9 04/24/2017 1620   LYMPHSABS 1.1 02/23/2009 0410   MONOABS 1.3 (H) 02/23/2009 0410   EOSABS 0.2 02/23/2009 0410    BASOSABS 0.0 02/23/2009 0410   CMP     Component Value Date/Time   NA 141 04/24/2017 1620   K 3.8 04/24/2017 1620   CL 107 04/24/2017 1620   CO2 24 04/24/2017 1620   GLUCOSE 105 (H) 04/24/2017 1620   BUN 13 04/24/2017 1620   CREATININE 1.21 04/24/2017 1620   CALCIUM 9.3 04/24/2017 1620   PROT 5.6 (L) 02/23/2009 0410   ALBUMIN 2.1 (L) 02/23/2009 0410   AST 29 02/23/2009 0410   ALT 23 02/23/2009 0410   ALKPHOS 91 02/23/2009 0410   BILITOT 1.3 (H) 02/23/2009 0410   GFRNONAA 58 (L) 04/24/2017 1620   GFRAA >60 04/24/2017 1620   Lipid Panel     Component Value Date/Time   CHOL  02/18/2009 0415    123        ATP III CLASSIFICATION:  <200     mg/dL   Desirable  200-239  mg/dL   Borderline High  >=  240    mg/dL   High          TRIG 61 02/18/2009 0415   HDL 50 02/18/2009 0415   CHOLHDL 2.5 02/18/2009 0415   VLDL 12 02/18/2009 0415   LDLCALC  02/18/2009 0415    61        Total Cholesterol/HDL:CHD Risk Coronary Heart Disease Risk Table                     Men   Women  1/2 Average Risk   3.4   3.3  Average Risk       5.0   4.4  2 X Average Risk   9.6   7.1  3 X Average Risk  23.4   11.0        Use the calculated Patient Ratio above and the CHD Risk Table to determine the patient's CHD Risk.        ATP III CLASSIFICATION (LDL):  <100     mg/dL   Optimal  100-129  mg/dL   Near or Above                    Optimal  130-159  mg/dL   Borderline  160-189  mg/dL   High  >190     mg/dL   Very High   Imaging I have reviewed the images obtained: CT-scan of the brain -no acute changes  MRI examination of the brain-no stroke, no bleed.  There is loss of flow void of the left vertebral artery on the susceptibility images and T2 images along with blooming artifact in the V4 segment.  This is concerning for thrombus versus slow flow from dissection. MRA of the head and neck showed possible thrombus versus dissection in the V4 segment of the left vertebral artery. CT angiogram head  and neck-again demonstrated no flow in the V4 segment with trickle flow in the V3 segment of the left vertebral artery.  Concern for intracranial dissection versus thrombus.  Assessment:  72 year old man with a past medical history of hypertension and hyperlipidemia along with anxiety depression was in usual state of health till 10 AM when he started developing dizziness and gait instability that he noted when he got out of the car and went to use the restroom. This was associated with neck pain. Similar symptoms happened and spontaneously subsided a week ago. He continued to have dizziness and gets instability for many hours and it subsided after a few hours in the emergency room. MRI of the brain is negative for a stroke but the MR angiogram and the CT angiogram of the head and neck are concerning for a left vertebral artery occlusion from a thrombus versus dissection in the V4 segment. At this time, the patient is asymptomatic and has an NIH stroke scale of 0.  Not a candidate for TPA due to being outside the window.  NIH 0 hence not a candidate for EVD. His imaging findings are definitely puzzling because if this were a true acute thrombus or dissection, he would have demonstrated acute strokes in the posterior circulation on the MRI- which he has and as his MRI is negative for stroke. This could either be explained by the fact this is either a chronic occlusion or an acute occlusion due to thrombus or dissection with good collateralization, hence no ischemia. In either case, I feel that he should be evaluated for stroke risk factors and cardioembolic sources for stroke. Also, given SBP  in the 190s, HTN urgency should also be kept on differentials.  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  Chronic Evaluate for cardio embolic source for vertebral thrombus Evaluate for HTN  urgecy  Recommendations: -Admit to hospitalist -Telemetry -2D echo -A1c -Lipid Panel -ASA 325 as the dissection if it is really acute, is intradural and anticoagulation will be very high risk for [facility]. -Frequent neurochecks -Atorvastatin 80 -PT OT ST -Permissive HTN - treat only PRN if SBP >220. Normalize SBP to <140 after 24-48h. -Consider DSA if cardiac work up remains elusive. I will wait for the stroke team to reevaluate the patient in the AM and review the images, maybe for a second opinion from neurorads prior to making decision to go for DSA or not.  Please page stroke NP/PA/MD (listed on AMION)  from 8am-4 pm as this patient will be followed by the stroke team at this point.  -- Amie Portland, MD [facility] Pager: [phone] If 7pm to 7am, please call on call as listed on AMION.

## 2017-04-25 NOTE — Progress Notes (Signed)
STROKE TEAM PROGRESS NOTE   SUBJECTIVE (INTERVAL HISTORY) His wife and son and daughter are at the bedside.  Patient stated that a week ago he was at home watching TV, he had sudden onset left neck and shoulder pain, lasting 35-45 minutes, symptom resolved without any dizziness or nausea vomiting.  This time, he was sitting in a restaurant on the way home from [place], he went to bathroom where he had acute onset vertigo, dizziness, not able to walk, nausea and vomiting.  Sent to our ED where he gradually getting better and now symptoms all resolved.  MRI no acute stroke.  CTA head and neck and MRA head and neck concerning for left vertebral artery dissection.  He denies any trauma, aggressive exercises, or neck had abrupt movement.  OBJECTIVE Temp:  [97.7 F (36.5 C)-98.5 F (36.9 C)] 97.7 F (36.5 C) (03/10 0748) Pulse Rate:  [78-109] 84 (03/10 1153) Cardiac Rhythm: Heart block (03/10 0700) Resp:  [14-23] 16 (03/10 0930) BP: (141-198)/(92-133) 144/98 (03/10 1153) SpO2:  [94 %-99 %] 96 % (03/10 1153)  CBC:  Recent Labs  Lab 04/24/17 1620  WBC 8.1  HGB 15.6  HCT 46.1  MCV 93.7  PLT 341    Basic Metabolic Panel:  Recent Labs  Lab 04/24/17 1620  NA 141  K 3.8  CL 107  CO2 24  GLUCOSE 105*  BUN 13  CREATININE 1.21  CALCIUM 9.3    Lipid Panel:     Component Value Date/Time   CHOL 106 04/25/2017 0619   TRIG 77 04/25/2017 0619   HDL 39 (L) 04/25/2017 0619   CHOLHDL 2.7 04/25/2017 0619   VLDL 15 04/25/2017 0619   LDLCALC 52 04/25/2017 0619   HgbA1c:  Lab Results  Component Value Date   HGBA1C 5.2 04/25/2017   Urine Drug Screen: No results found for: LABOPIA, COCAINSCRNUR, LABBENZ, AMPHETMU, THCU, LABBARB  Alcohol Level No results found for: [place] I have personally reviewed the radiological images below and agree with the radiology interpretations.  Ct Angio Head W Or Wo Contrast Ct Angio Neck W And/or Wo Contrast 04/25/2017 IMPRESSION:  1. Negative CT of  the head.  2. Patent left V1 and V2 segments with diminishing downstream enhancement of the left V3 segment, no appreciable enhancement of V3 just upstream to foramen magnum, no enhancement of left V4 segment. Susceptibility blooming is present within the left V4 segment on prior MRI indicating thrombus or dissection. Diminishing downstream enhancement on both CTA and MRA within distal left V2 and V3 segments is likely due to slow antegrade flow from the obstructing left V4 lesion.  3. Otherwise patent patent bilateral carotid and right vertebral artery. No dissection, aneurysm, or hemodynamically significant stenosis utilizing NASCET criteria.  4. Otherwise patent anterior and posterior intracranial circulation. No large vessel occlusion, aneurysm, or significant stenosis.    Dg Chest 2 View 04/25/2017  IMPRESSION:  No acute cardiopulmonary process seen.    Mr Angiogram Head Wo Contrast 04/24/2017 IMPRESSION:   MRI head:  No acute intracranial abnormality. Unremarkable MRI of the brain for age.   MRA head:  1. 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  2. Otherwise patent anterior and posterior intracranial circulation.   MRA neck:  1. Left V4 occlusion. Patent enhancement of left V1, V2, and V3 segments with gradient decreasing downstream enhancement compatible slow antegrade flow.  2. Widely patent bilateral carotid systems and right vertebral artery.   Transthoracic Echocardiogram - pending    PHYSICAL  EXAM Vitals:   04/25/17 0529 04/25/17 0748 04/25/17 0930 04/25/17 1153  BP: (!) 144/93 (!) 148/107 (!) 168/92 (!) 144/98  Pulse: 78 85 88 84  Resp: 16 16 16    Temp: 97.7 F (36.5 C) 97.7 F (36.5 C)    TempSrc: Oral Oral    SpO2: 95% 99% 97% 96%  Height:       General - Pleasant 72 yo male in NAD Heart - Regular rate and rhythm - no murmer Lungs - Clear to auscultation Abdomen - Soft - non tender Extremities - Distal pulses intact - no  edema Skin - Warm and dry  Neurologic Examination:   Mental Status:  Alert, oriented, thought content appropriate. Speech - no dysarthria or evidence of aphasia. Able to follow 3 step commands without difficulty.  Cranial Nerves:  II: Discs not visualized. Visual fields grossly normal, pupils equal, round, reactive to light and accommodation  III,IV, VI: ptosis not present, extra-ocular motions intact bilaterally  V,VII: smile symmetric, facial light touch intact bilaterally VIII: hearing normal bilaterally  IX,X: gag reflex present  XI: bilateral shoulder shrug  XII: midline tongue extension  Motor:  Right : Upper extremity 5/5 Left: Upper extremity 5/5  Lower extremity 5/5 Lower extremity 5/5  Tone and bulk:normal tone throughout; no atrophy noted  Sensory: intact to light touch Cerebellar:  Mild tremor with finger to nose LUE, normal heel-to-shin test    ASSESSMENT/PLAN [name] is a 72 y.o. male with history of hypertension, hyperlipidemia, anxiety, depression, and history of pancreatitis presenting with acute onset of dizziness and balance problems.  He did not receive IV t-PA due to late presentation.  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   Resultant  Back to baseline  CT head - Negative CT of the head.   MRI head - No acute intracranial abnormality.  MRA H&N - Left V4 occlusion possibly dissection or thromboembolic occlusion.   CTA H&N - obstructing left V4 lesion.   2D Echo - pending  LDL 52  HgbA1c - 5.2  VTE prophylaxis - Lovenox Fall precautions Diet Heart Room service appropriate? Yes; Fluid consistency: Thin  No antithrombotic prior to admission, now on aspirin 325 mg daily, plavix 75 after 300mg  loading. Continue DAPT 3 months and then plavix alone.   Patient counseled to be compliant with his antithrombotic medications  Ongoing aggressive stroke risk factor management  Therapy  recommendations:  NO PT F/U  Disposition:  Pending  Left VA dissection vs. Atherosclerotic occlusion  MRA head and neck, CTA head and neck confirmed  No need of DSA  On dual antiplatelet after loading dose  On high dose lipitor  Continue DAPT for 3 months and then plavix alone  Repeat CTA head and neck in 2-3 months  Follow up with stroke clinic at [facility]  Hypertension  Stable  Permissive hypertension (OK if < 220/120) but gradually normalize in 5-7 days  Long-term BP goal normotensive  Hyperlipidemia  Home meds:  Lipitor 20 mg daily not resumed in hospital  LDL 52, goal < 70  Now on Lipitor 80 mg daily due to VA dissection  Continue statin at discharge  Other Stroke Risk Factors  Advanced age  Former cigarette smoker - quit 40 yrs ago  ETOH use, advised to drink no more than 1 drink per day.  Other [facility] day # 0  Neurology will sign off. Please call with questions. Pt will follow  up with stroke clinic NP at [facility] in about 4 weeks. Thanks for the consult.  Rosalin Hawking, MD PhD Stroke Neurology 04/25/2017 3:46 PM   To contact Stroke Continuity provider, please refer to [email or web address] After hours, contact General Neurology

## 2017-04-25 NOTE — Discharge Instructions (Signed)
Clopidogrel tablets °What is this medicine? °CLOPIDOGREL (kloh PID oh grel) helps to prevent blood clots. This medicine is used to prevent heart attack, stroke, or other vascular events in people who are at high risk. °This medicine may be used for other purposes; ask your health care provider or pharmacist if you have questions. °COMMON BRAND NAME(S): Plavix °What should I tell my health care provider before I take this medicine? °They need to know if you have any of the following conditions: °-bleeding disorders °-bleeding in the brain °-having surgery °-history of stomach bleeding °-an unusual or allergic reaction to clopidogrel, other medicines, foods, dyes, or preservatives °-pregnant or trying to get pregnant °-breast-feeding °How should I use this medicine? °Take this medicine by mouth with a glass of water. Follow the directions on the prescription label. You may take this medicine with or without food. If it upsets your stomach, take it with food. Take your medicine at regular intervals. Do not take it more often than directed. Do not stop taking except on your doctor's advice. °A special MedGuide will be given to you by the pharmacist with each prescription and refill. Be sure to read this information carefully each time. °Talk to your pediatrician regarding the use of this medicine in children. Special care may be needed. °Overdosage: If you think you have taken too much of this medicine contact a poison control center or emergency room at once. °NOTE: This medicine is only for you. Do not share this medicine with others. °What if I miss a dose? °If you miss a dose, take it as soon as you can. If it is almost time for your next dose, take only that dose. Do not take double or extra doses. °What may interact with this medicine? °Do not take this medicine with the following medications: °-dasabuvir; ombitasvir; paritaprevir; ritonavir °-defibrotide °This medicine may also interact with the following  medications: °-antiviral medicines for HIV or AIDS °-aspirin °-certain medicines for depression like citalopram, fluoxetine, fluvoxamine °-certain medicines for fungal infections like ketoconazole, fluconazole, voriconazole °-certain medicines for seizures like felbamate, oxcarbazepine, phenytoin °-certain medicines for stomach problems like cimetidine, omeprazole, esomeprazole °-certain medicines that treat or prevent blood clots like warfarin, enoxaparin, dalteparin, apixaban, dabigatran, rivaroxaban, ticlopidine °-chloramphenicol °-cilostazol °-fluvastatin °-isoniazid °-modafinil °-nicardipine °-NSAIDS, medicines for pain and inflammation, like ibuprofen or naproxen °-quinine °-repaglinide °-tamoxifen °-tolbutamide °-topiramate °-torsemide °This list may not describe all possible interactions. Give your health care provider a list of all the medicines, herbs, non-prescription drugs, or dietary supplements you use. Also tell them if you smoke, drink alcohol, or use illegal drugs. Some items may interact with your medicine. °What should I watch for while using this medicine? °Visit your doctor or health care professional for regular check ups. Do not stop taking your medicine unless your doctor tells you to. °Notify your doctor or health care professional and seek emergency treatment if you develop breathing problems; changes in vision; chest pain; severe, sudden headache; pain, swelling, warmth in the leg; trouble speaking; sudden numbness or weakness of the face, arm or leg. These can be signs that your condition has gotten worse. °If you are going to have surgery or dental work, tell your doctor or health care professional that you are taking this medicine. °Certain genetic factors may reduce the effect of this medicine. Your doctor may use genetic tests to determine treatment. °What side effects may I notice from receiving this medicine? °Side effects that you should report to your doctor or health care    professional as soon as possible: °-allergic reactions like skin rash, itching or hives, swelling of the face, lips, or tongue °-signs and symptoms of bleeding such as bloody or black, tarry stools; red or dark-brown urine; spitting up blood or brown material that looks like coffee grounds; red spots on the skin; unusual bruising or bleeding from the eye, gums, or nose °-signs and symptoms of a blood clot such as breathing problems; changes in vision; chest pain; severe, sudden headache; pain, swelling, warmth in the leg; trouble speaking; sudden numbness or weakness of the face, arm or leg °Side effects that usually do not require medical attention (report to your doctor or health care professional if they continue or are bothersome): °-constipation °-diarrhea °-headache °-upset stomach °This list may not describe all possible side effects. Call your doctor for medical advice about side effects. You may report side effects to FDA at 1-800-FDA-1088. °Where should I keep my medicine? °Keep out of the reach of children. °Store at room temperature of 59 to 86 degrees F (15 to 30 degrees C). Throw away any unused medicine after the expiration date. °NOTE: This sheet is a summary. It may not cover all possible information. If you have questions about this medicine, talk to your doctor, pharmacist, or health care provider. °© 2018 [employer] (2014-11-08 10:00:44) ° °

## 2017-04-25 NOTE — Progress Notes (Signed)
Pt discharged to home. PIV removed, AVS reviewed. Pt to follow up with PCP and neuro outpatient. Pt left unit via wheelchair, belongings in hand including prescriptions. Pt to be transported home by spouse.

## 2017-04-25 NOTE — Evaluation (Signed)
Physical Therapy Evaluation and Discharge  Patient Details Name: Edward Zavala MRN: [id number] DOB: [birth year] Today's Date: 04/25/2017   History of Present Illness  72 year old man with a past medical history of hypertension and hyperlipidemia along with anxiety depression was in usual state of health till 10 AM when he started developing dizziness and gait instability that he noted when he got out of the car and went to use the restroom. MRI brain shows no stroke.  MRA head/neck shows thrombus vs dissection in left V4 segment vertebral artery.  CTA head and nec obtained also showing thrombus vs dissection in L V4.  Clinical Impression  Patient evaluated by Physical Therapy with no further acute PT needs identified. All education has been completed and the patient has no further questions. Pt is independent in all mobility including ascent/descent of 18 steps. PT is signing off. Thank you for this referral.     Follow Up Recommendations No PT follow up    Equipment Recommendations  None recommended by PT       Precautions / Restrictions Precautions Precautions: None Restrictions Weight Bearing Restrictions: No      Mobility  Bed Mobility Overal bed mobility: Independent                Transfers Overall transfer level: Independent Equipment used: None                Ambulation/Gait Ambulation/Gait assistance: Independent Ambulation Distance (Feet): 400 Feet Assistive device: None Gait Pattern/deviations: WFL(Within Functional Limits) Gait velocity: WFL Gait velocity interpretation: at or above normal speed for age/gender    Stairs Stairs: Yes Stairs assistance: Independent Stair Management: One rail Right Number of Stairs: 18 General stair comments: strong, steady ascent/descent with and without use of handrail  Wheelchair Mobility    Modified Rankin (Stroke Patients Only)       Balance Overall balance assessment: Independent(including head  turns, directional change and stop and turn)                                           Pertinent Vitals/Pain Pain Assessment: No/denies pain    Home Living Family/patient expects to be discharged to:: Private residence Living Arrangements: Spouse/significant other Available Help at Discharge: Family Type of Home: House Home Access: Stairs to enter Entrance Stairs-Rails: Right Entrance Stairs-Number of Steps: 10 Home Layout: Multi-level;Bed/bath upstairs Home Equipment: None      Prior Function Level of Independence: Independent                  Extremity/Trunk Assessment   Upper Extremity Assessment Upper Extremity Assessment: Overall WFL for tasks assessed    Lower Extremity Assessment Lower Extremity Assessment: Overall WFL for tasks assessed       Communication   Communication: No difficulties  Cognition Arousal/Alertness: Awake/alert Behavior During Therapy: WFL for tasks assessed/performed                                                 Assessment/Plan    PT Assessment Patent does not need any further PT services         PT Goals (Current goals can be found in the Care Plan section)  Acute Rehab PT Goals Patient Stated Goal: go home  PT Goal Formulation: With patient     AM-PAC PT "6 Clicks" Daily Activity  Outcome Measure Difficulty turning over in bed (including adjusting bedclothes, sheets and blankets)?: None Difficulty moving from lying on back to sitting on the side of the bed? : None Difficulty sitting down on and standing up from a chair with arms (e.g., wheelchair, bedside commode, etc,.)?: None Help needed moving to and from a bed to chair (including a wheelchair)?: None Help needed walking in hospital room?: None Help needed climbing 3-5 steps with a railing? : None 6 Click Score: 24    End of Session Equipment Utilized During Treatment: Gait belt Activity Tolerance: Patient tolerated  treatment well Patient left: in bed;with call bell/phone within reach Nurse Communication: Mobility status      Time: 1415-1433 PT Time Calculation (min) (ACUTE ONLY): 18 min   Charges:   PT Evaluation $PT Eval Moderate Complexity: 1 Mod     PT G Codes:        Teleah Villamar B. Migdalia Dk PT, DPT Acute Rehabilitation  [phone] Pager [phone]    [place] 04/25/2017, 2:46 PM

## 2017-04-25 NOTE — H&P (Signed)
History and Physical    Edward Zavala EHU:[id number] DOB: 1946/02/04 DOA: 04/24/2017  PCP: Kelton Pillar, MD  Patient coming from: Home  I have personally briefly reviewed patient's old medical records in [place]  Chief Complaint: Dizziness  HPI: Edward Zavala is a 72 y.o. male with medical history significant of anxiety, HTN, HLD.  Patient was in usual state of health until 10am on 3/9.  Patient was driving on way back from beach when he had sudden onset of dizziness and L sided neck pain.   He had to sit down at a restroom and be helped by people as he was very off balance and could not get into his car. Wife reports similar symptoms happened for a shorter duration last week as well. There is no history of focal weakness, facial droop or dysarthria per his wife who was with him at all times during this episode.  Patient denies any vision changes headaches or nausea vomiting.  He said that the dizziness was as if his head was spinning and he could not focus on anything that he was seen.  There was no exacerbating or relieving factors.  No position made it better or worse.  Associated symptoms were that of gait imbalance and instability.  The symptoms have gradually lessened in severity and are now completely resolved.   ED Course: MRI brain shows no stroke.  MRA head/neck shows thrombus vs dissection in left V4 segment vertebral artery.  CTA head and nec obtained also showing thrombus vs dissection in L V4.  Patient is asymptomatic at this time.   Review of Systems: As per HPI otherwise 10 point review of systems negative.   Past Medical History:  Diagnosis Date  . Anxiety   . Depression   . Dyslipidemia (high LDL; low HDL)   . GERD (gastroesophageal reflux disease)   . H/O acute pancreatitis   . H/O gastroesophageal reflux (GERD)   . Headache(784.0)    occasinal   . Hypertension     Past Surgical History:  Procedure Laterality Date  . CHOLECYSTECTOMY    . EYE  SURGERY     age 32 months old   . HERNIA REPAIR     [place] Hernia  . LAPAROSCOPIC NISSEN FUNDOPLICATION    . VENTRAL HERNIA REPAIR  12/01/2011   Procedure: LAPAROSCOPIC VENTRAL HERNIA;  Surgeon: Edward Jolly, MD;  Location: [facility];  Service: General;  Laterality: N/A;  x2  with mesh     reports that he quit smoking about 40 years ago. His smoking use included cigarettes. he has never used smokeless tobacco. He reports that he drinks about 2.4 oz of alcohol per week. He reports that he does not use drugs.  No Known Allergies  Family History  Problem Relation Age of Onset  . Cancer Mother 27       Breast Cancer     Prior to Admission medications   Medication Sig Start Date End Date Taking? Authorizing Provider  aspirin-acetaminophen-caffeine (EXCEDRIN MIGRAINE) [phone] MG per tablet Take 1 tablet by mouth every 6 (six) hours as needed. Headache   Yes [provider]  atorvastatin (LIPITOR) 20 MG tablet Take 20 mg by mouth daily. 02/23/17  Yes [provider]  buPROPion (WELLBUTRIN XL) 150 MG 24 hr tablet Take 150 mg by mouth daily before breakfast.  09/28/11  Yes [provider]  busPIRone (BUSPAR) 15 MG tablet Take 15 mg by mouth daily. 03/16/17  Yes [provider]  butalbital-aspirin-caffeine [facility]) 50-325-40 MG per capsule Take 1 capsule by mouth as needed. Headache   Yes [provider]  hydroxypropyl methylcellulose (ISOPTO TEARS) 2.5 % ophthalmic solution Place 1 drop into both eyes as needed. Dry eyes   Yes [provider]  lisinopril (PRINIVIL,ZESTRIL) 40 MG tablet Take 40 mg by mouth daily. 04/14/17  Yes [provider]  lisinopril-hydrochlorothiazide (PRINZIDE,ZESTORETIC) 20-12.5 MG per tablet Take 1 tablet by mouth daily before breakfast.  08/25/11  Yes [provider]  omeprazole (PRILOSEC) 40 MG capsule Take 40 mg by mouth daily.  08/26/11  Yes [provider]  REXULTI 2 MG TABS Take 2  mg by mouth daily. 02/22/17  Yes [provider]    Physical Exam: Vitals:   04/24/17 2245 04/24/17 2300 04/24/17 2315 04/24/17 2330  BP: (!) 143/99 (!) 151/105 (!) 148/108 (!) 149/103  Pulse: 83 81 87 85  Resp: 20 17 14 14   Temp:      TempSrc:      SpO2: 94% 96% 97% 96%    Constitutional: NAD, calm, comfortable Eyes: PERRL, lids and conjunctivae normal ENMT: Mucous membranes are moist. Posterior pharynx clear of any exudate or lesions.Normal dentition.  Neck: normal, supple, no masses, no thyromegaly Respiratory: clear to auscultation bilaterally, no wheezing, no crackles. Normal respiratory effort. No accessory muscle use.  Cardiovascular: Regular rate and rhythm, no murmurs / rubs / gallops. No extremity edema. 2+ pedal pulses. No carotid bruits.  Abdomen: no tenderness, no masses palpated. No hepatosplenomegaly. Bowel sounds positive.  Musculoskeletal: no clubbing / cyanosis. No joint deformity upper and lower extremities. Good ROM, no contractures. Normal muscle tone.  Skin: no rashes, lesions, ulcers. No induration Neurologic: CN 2-12 grossly intact. Sensation intact, DTR normal. Strength 5/5 in all 4.  Psychiatric: Normal judgment and insight. Alert and oriented x 3. Normal mood.    Labs on Admission: I have personally reviewed following labs and imaging studies  CBC: Recent Labs  Lab 04/24/17 1620  WBC 8.1  HGB 15.6  HCT 46.1  MCV 93.7  PLT 924   Basic Metabolic Panel: Recent Labs  Lab 04/24/17 1620  NA 141  K 3.8  CL 107  CO2 24  GLUCOSE 105*  BUN 13  CREATININE 1.21  CALCIUM 9.3   GFR: CrCl cannot be calculated (Unknown ideal weight.). Liver Function Tests: No results for input(s): AST, ALT, ALKPHOS, BILITOT, PROT, ALBUMIN in the last 168 hours. No results for input(s): LIPASE, AMYLASE in the last 168 hours. No results for input(s): AMMONIA in the last 168 hours. Coagulation Profile: No results for input(s): INR, PROTIME in the last 168  hours. Cardiac Enzymes: No results for input(s): CKTOTAL, CKMB, CKMBINDEX, TROPONINI in the last 168 hours. BNP (last 3 results) No results for input(s): PROBNP in the last 8760 hours. HbA1C: No results for input(s): HGBA1C in the last 72 hours. CBG: Recent Labs  Lab 04/24/17 1612  GLUCAP 97   Lipid Profile: No results for input(s): CHOL, HDL, LDLCALC, TRIG, CHOLHDL, LDLDIRECT in the last 72 hours. Thyroid Function Tests: No results for input(s): TSH, T4TOTAL, FREET4, T3FREE, THYROIDAB in the last 72 hours. Anemia Panel: No results for input(s): VITAMINB12, FOLATE, FERRITIN, TIBC, IRON, RETICCTPCT in the last 72 hours. Urine analysis:    Component Value Date/Time   COLORURINE YELLOW 04/24/2017 1822   APPEARANCEUR CLEAR 04/24/2017 1822   LABSPEC 1.016 04/24/2017 1822   PHURINE 7.0 04/24/2017 1822   GLUCOSEU NEGATIVE 04/24/2017 1822   HGBUR SMALL (  A) 04/24/2017 1822   BILIRUBINUR NEGATIVE 04/24/2017 1822   KETONESUR NEGATIVE 04/24/2017 1822   PROTEINUR NEGATIVE 04/24/2017 1822   UROBILINOGEN 1.0 02/20/2009 1644   NITRITE NEGATIVE 04/24/2017 1822   LEUKOCYTESUR NEGATIVE 04/24/2017 1822    Radiological Exams on Admission: Ct Angio Head W Or Wo Contrast  Result Date: 04/25/2017 CLINICAL DATA:  72 y/o  M; left vertebral artery abnormality on MRI. EXAM: CT ANGIOGRAPHY HEAD AND NECK TECHNIQUE: Multidetector CT imaging of the head and neck was performed using the standard protocol during bolus administration of intravenous contrast. Multiplanar CT image reconstructions and MIPs were obtained to evaluate the vascular anatomy. Carotid stenosis measurements (when applicable) are obtained utilizing NASCET criteria, using the distal internal carotid diameter as the denominator. COMPARISON:  MRA head and neck dated 04/24/2016. FINDINGS: CT HEAD FINDINGS Brain: No evidence of acute infarction, hemorrhage, hydrocephalus, extra-axial collection or mass lesion/mass effect. Vascular: As below.  Skull: Normal. Negative for fracture or focal lesion. Sinuses: Imaged portions are clear. Orbits: No acute finding. Review of the MIP images confirms the above findings CTA NECK FINDINGS Aortic arch: Bovine variant branching. Imaged portion shows no evidence of aneurysm or dissection. No significant stenosis of the major arch vessel origins. Mild calcific atherosclerosis of the arch. Fibrofatty plaque of left subclavian artery origin with minimal less than 30% stenosis. Right carotid system: No evidence of dissection, stenosis (50% or greater) or occlusion. Minimal calcific atherosclerosis of carotid bifurcation. Left carotid system: No evidence of dissection, stenosis (50% or greater) or occlusion. Minimal calcific atherosclerosis of carotid bifurcation. Vertebral arteries: Right dominant. No evidence of dissection, stenosis (50% or greater) or occlusion of right vertebral artery. Patent left V1 and V2 segments with diminishing antegrade enhancement of the left V3 segment, no appreciable enhancement of the V3 just upstream to foramen magnum, no enhancement of left V4 segment. Susceptibility blooming is present within the left V4 segment on prior MRI indicating obstructing thrombus or dissection and findings on CTA are likely due to slow antegrade flow from the obstruction. Skeleton: No acute finding. Moderate cervical spondylosis with multilevel disc and facet degenerative changes. No high-grade bony canal stenosis. Uncovertebral and facet hypertrophy result in right-sided C4-C7 neural foraminal encroachment and left-sided C4-5 and C6-7 neural foraminal encroachment. Other neck: Negative. Upper chest: The negative. Review of the MIP images confirms the above findings CTA HEAD FINDINGS Anterior circulation: No significant stenosis, proximal occlusion, aneurysm, or vascular malformation. Mild non stenotic calcific atherosclerosis of carotid siphons. Posterior circulation: Patent left V1 and V2 segments with  diminishing antegrade enhancement of the left V3 segment, no appreciable enhancement of V3 just upstream to foramen magnum, no enhancement of left V4 segment. Susceptibility blooming is present within the left V4 segment on prior MRI indicating obstructing thrombus or dissection and findings on CTA are likely due to slow antegrade flow from the obstruction. Findings on MRA of the neck with and without contrast are similar given differences in technique. Otherwise no significant stenosis, proximal occlusion, aneurysm, or vascular malformation of the posterior circulation. Venous sinuses: As permitted by contrast timing, patent. Anatomic variants: Anterior communicating artery and left posterior communicating artery. No right posterior communicating artery identified, likely hypoplastic or absent. Delayed phase: No abnormal intracranial enhancement. Review of the MIP images confirms the above findings IMPRESSION: 1. Negative CT of the head. 2. Patent left V1 and V2 segments with diminishing downstream enhancement of the left V3 segment, no appreciable enhancement of V3 just upstream to foramen magnum, no enhancement of left  V4 segment. Susceptibility blooming is present within the left V4 segment on prior MRI indicating thrombus or dissection. Diminishing downstream enhancement on both CTA and MRA within distal left V2 and V3 segments is likely due to slow antegrade flow from the obstructing left V4 lesion. 3. Otherwise patent patent bilateral carotid and right vertebral artery. No dissection, aneurysm, or hemodynamically significant stenosis utilizing NASCET criteria. 4. Otherwise patent anterior and posterior intracranial circulation. No large vessel occlusion, aneurysm, or significant stenosis. Electronically Signed   By: Kristine Garbe M.D.   On: 04/25/2017 00:39   Ct Angio Neck W And/or Wo Contrast  Result Date: 04/25/2017 CLINICAL DATA:  72 y/o  M; left vertebral artery abnormality on MRI. EXAM: CT  ANGIOGRAPHY HEAD AND NECK TECHNIQUE: Multidetector CT imaging of the head and neck was performed using the standard protocol during bolus administration of intravenous contrast. Multiplanar CT image reconstructions and MIPs were obtained to evaluate the vascular anatomy. Carotid stenosis measurements (when applicable) are obtained utilizing NASCET criteria, using the distal internal carotid diameter as the denominator. COMPARISON:  MRA head and neck dated 04/24/2016. FINDINGS: CT HEAD FINDINGS Brain: No evidence of acute infarction, hemorrhage, hydrocephalus, extra-axial collection or mass lesion/mass effect. Vascular: As below. Skull: Normal. Negative for fracture or focal lesion. Sinuses: Imaged portions are clear. Orbits: No acute finding. Review of the MIP images confirms the above findings CTA NECK FINDINGS Aortic arch: Bovine variant branching. Imaged portion shows no evidence of aneurysm or dissection. No significant stenosis of the major arch vessel origins. Mild calcific atherosclerosis of the arch. Fibrofatty plaque of left subclavian artery origin with minimal less than 30% stenosis. Right carotid system: No evidence of dissection, stenosis (50% or greater) or occlusion. Minimal calcific atherosclerosis of carotid bifurcation. Left carotid system: No evidence of dissection, stenosis (50% or greater) or occlusion. Minimal calcific atherosclerosis of carotid bifurcation. Vertebral arteries: Right dominant. No evidence of dissection, stenosis (50% or greater) or occlusion of right vertebral artery. Patent left V1 and V2 segments with diminishing antegrade enhancement of the left V3 segment, no appreciable enhancement of the V3 just upstream to foramen magnum, no enhancement of left V4 segment. Susceptibility blooming is present within the left V4 segment on prior MRI indicating obstructing thrombus or dissection and findings on CTA are likely due to slow antegrade flow from the obstruction. Skeleton: No  acute finding. Moderate cervical spondylosis with multilevel disc and facet degenerative changes. No high-grade bony canal stenosis. Uncovertebral and facet hypertrophy result in right-sided C4-C7 neural foraminal encroachment and left-sided C4-5 and C6-7 neural foraminal encroachment. Other neck: Negative. Upper chest: The negative. Review of the MIP images confirms the above findings CTA HEAD FINDINGS Anterior circulation: No significant stenosis, proximal occlusion, aneurysm, or vascular malformation. Mild non stenotic calcific atherosclerosis of carotid siphons. Posterior circulation: Patent left V1 and V2 segments with diminishing antegrade enhancement of the left V3 segment, no appreciable enhancement of V3 just upstream to foramen magnum, no enhancement of left V4 segment. Susceptibility blooming is present within the left V4 segment on prior MRI indicating obstructing thrombus or dissection and findings on CTA are likely due to slow antegrade flow from the obstruction. Findings on MRA of the neck with and without contrast are similar given differences in technique. Otherwise no significant stenosis, proximal occlusion, aneurysm, or vascular malformation of the posterior circulation. Venous sinuses: As permitted by contrast timing, patent. Anatomic variants: Anterior communicating artery and left posterior communicating artery. No right posterior communicating artery identified, likely hypoplastic or absent.  Delayed phase: No abnormal intracranial enhancement. Review of the MIP images confirms the above findings IMPRESSION: 1. Negative CT of the head. 2. Patent left V1 and V2 segments with diminishing downstream enhancement of the left V3 segment, no appreciable enhancement of V3 just upstream to foramen magnum, no enhancement of left V4 segment. Susceptibility blooming is present within the left V4 segment on prior MRI indicating thrombus or dissection. Diminishing downstream enhancement on both CTA and MRA  within distal left V2 and V3 segments is likely due to slow antegrade flow from the obstructing left V4 lesion. 3. Otherwise patent patent bilateral carotid and right vertebral artery. No dissection, aneurysm, or hemodynamically significant stenosis utilizing NASCET criteria. 4. Otherwise patent anterior and posterior intracranial circulation. No large vessel occlusion, aneurysm, or significant stenosis. Electronically Signed   By: Kristine Garbe M.D.   On: 04/25/2017 00:39   Mr Angiogram Head Wo Contrast  Result Date: 04/24/2017 CLINICAL DATA:  72 y/o M; sudden dizziness. Ataxia, stroke suspected. EXAM: MR HEAD WITHOUT CONTRAST MRA HEAD WITHOUT CONTRAST MRA OF THE NECK WITHOUT AND WITH CONTRAST TECHNIQUE: Multiplanar, multiecho pulse sequences of the brain and surrounding structures were obtained without intravenous contrast. Angiographic images of the neck were obtained using MRA technique without and with intravenous contrast. Angiographic images of the head were obtained using MRA technique without intravenous contrast. CONTRAST:  77mL MULTIHANCE GADOBENATE DIMEGLUMINE 529 MG/ML IV SOLN COMPARISON:  None. FINDINGS: MR HEAD FINDINGS Brain: No acute infarction, hemorrhage, hydrocephalus, extra-axial collection or mass lesion. Few nonspecific foci of T2 FLAIR hyperintense signal abnormality in subcortical white matter of unlikely clinical significance. Vascular: Abnormal signal within the left vertebral artery with associated susceptibility blooming. Otherwise normal flow voids. Skull and upper cervical spine: Normal marrow signal. Sinuses/Orbits: Negative. Other: None. MRA HEAD FINDINGS Internal carotid arteries:  Patent. Anterior cerebral arteries:  Patent. Middle cerebral arteries: Patent. Anterior communicating artery: Patent. Posterior communicating arteries: Patent left. No right identified, likely hypoplastic or absent. Posterior cerebral arteries:  Patent. Basilar artery:  Patent. Vertebral  arteries: Patent right. Faint flow related signal is present within the proximal left V4 segment with occlusion of the vessel beyond foramen magnum to the vertebrobasilar junction. This finding corresponds to signal abnormality on MRI of the head and absent enhancement within the V4 segment on MRA of the neck. There is a large left AICA/PICA branch perfusing inferior cerebellum. No evidence of high-grade stenosis, large vessel occlusion, or aneurysm unless noted above. MRA NECK FINDINGS Aortic arch: Patent. Right common carotid artery: Patent. Right internal carotid artery: Patent. Right vertebral artery: Patent. Left common carotid artery: Patent. Left Internal carotid artery: Patent. Left Vertebral artery: There is enhancement within left V1, V2, and V3 segments the decreases in a gradient fashion downstream compatible with slow antegrade flow. The left V4 segment is occluded from the foramen magnum. There is no evidence of hemodynamically significant stenosis by NASCET criteria, occlusion, or aneurysm unless noted above. IMPRESSION: MRI head: No acute intracranial abnormality. Unremarkable MRI of the brain for age. MRA head: 1. 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 2. Otherwise patent anterior and posterior intracranial circulation. MRA neck: 1. Left V4 occlusion. Patent enhancement of left V1, V2, and V3 segments with gradient decreasing downstream enhancement compatible slow antegrade flow. 2. Widely patent bilateral carotid systems and right vertebral artery. These results were called by telephone at the time of interpretation on 04/24/2017 at 10:40 pm to Dr. Orlie Dakin , who verbally acknowledged these results. Electronically Signed  By: Kristine Garbe M.D.   On: 04/24/2017 22:43   Mr Angiogram Neck W Or Wo Contrast  Result Date: 04/24/2017 CLINICAL DATA:  72 y/o M; sudden dizziness. Ataxia, stroke suspected. EXAM: MR HEAD WITHOUT CONTRAST MRA HEAD WITHOUT  CONTRAST MRA OF THE NECK WITHOUT AND WITH CONTRAST TECHNIQUE: Multiplanar, multiecho pulse sequences of the brain and surrounding structures were obtained without intravenous contrast. Angiographic images of the neck were obtained using MRA technique without and with intravenous contrast. Angiographic images of the head were obtained using MRA technique without intravenous contrast. CONTRAST:  79mL MULTIHANCE GADOBENATE DIMEGLUMINE 529 MG/ML IV SOLN COMPARISON:  None. FINDINGS: MR HEAD FINDINGS Brain: No acute infarction, hemorrhage, hydrocephalus, extra-axial collection or mass lesion. Few nonspecific foci of T2 FLAIR hyperintense signal abnormality in subcortical white matter of unlikely clinical significance. Vascular: Abnormal signal within the left vertebral artery with associated susceptibility blooming. Otherwise normal flow voids. Skull and upper cervical spine: Normal marrow signal. Sinuses/Orbits: Negative. Other: None. MRA HEAD FINDINGS Internal carotid arteries:  Patent. Anterior cerebral arteries:  Patent. Middle cerebral arteries: Patent. Anterior communicating artery: Patent. Posterior communicating arteries: Patent left. No right identified, likely hypoplastic or absent. Posterior cerebral arteries:  Patent. Basilar artery:  Patent. Vertebral arteries: Patent right. Faint flow related signal is present within the proximal left V4 segment with occlusion of the vessel beyond foramen magnum to the vertebrobasilar junction. This finding corresponds to signal abnormality on MRI of the head and absent enhancement within the V4 segment on MRA of the neck. There is a large left AICA/PICA branch perfusing inferior cerebellum. No evidence of high-grade stenosis, large vessel occlusion, or aneurysm unless noted above. MRA NECK FINDINGS Aortic arch: Patent. Right common carotid artery: Patent. Right internal carotid artery: Patent. Right vertebral artery: Patent. Left common carotid artery: Patent. Left Internal  carotid artery: Patent. Left Vertebral artery: There is enhancement within left V1, V2, and V3 segments the decreases in a gradient fashion downstream compatible with slow antegrade flow. The left V4 segment is occluded from the foramen magnum. There is no evidence of hemodynamically significant stenosis by NASCET criteria, occlusion, or aneurysm unless noted above. IMPRESSION: MRI head: No acute intracranial abnormality. Unremarkable MRI of the brain for age. MRA head: 1. 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 2. Otherwise patent anterior and posterior intracranial circulation. MRA neck: 1. Left V4 occlusion. Patent enhancement of left V1, V2, and V3 segments with gradient decreasing downstream enhancement compatible slow antegrade flow. 2. Widely patent bilateral carotid systems and right vertebral artery. These results were called by telephone at the time of interpretation on 04/24/2017 at 10:40 pm to Dr. Orlie Dakin , who verbally acknowledged these results. Electronically Signed   By: Kristine Garbe M.D.   On: 04/24/2017 22:43   Mr Brain Wo Contrast  Result Date: 04/24/2017 CLINICAL DATA:  72 y/o M; sudden dizziness. Ataxia, stroke suspected. EXAM: MR HEAD WITHOUT CONTRAST MRA HEAD WITHOUT CONTRAST MRA OF THE NECK WITHOUT AND WITH CONTRAST TECHNIQUE: Multiplanar, multiecho pulse sequences of the brain and surrounding structures were obtained without intravenous contrast. Angiographic images of the neck were obtained using MRA technique without and with intravenous contrast. Angiographic images of the head were obtained using MRA technique without intravenous contrast. CONTRAST:  29mL MULTIHANCE GADOBENATE DIMEGLUMINE 529 MG/ML IV SOLN COMPARISON:  None. FINDINGS: MR HEAD FINDINGS Brain: No acute infarction, hemorrhage, hydrocephalus, extra-axial collection or mass lesion. Few nonspecific foci of T2 FLAIR hyperintense signal abnormality in subcortical white matter of  unlikely clinical significance. Vascular: Abnormal signal within the left vertebral artery with associated susceptibility blooming. Otherwise normal flow voids. Skull and upper cervical spine: Normal marrow signal. Sinuses/Orbits: Negative. Other: None. MRA HEAD FINDINGS Internal carotid arteries:  Patent. Anterior cerebral arteries:  Patent. Middle cerebral arteries: Patent. Anterior communicating artery: Patent. Posterior communicating arteries: Patent left. No right identified, likely hypoplastic or absent. Posterior cerebral arteries:  Patent. Basilar artery:  Patent. Vertebral arteries: Patent right. Faint flow related signal is present within the proximal left V4 segment with occlusion of the vessel beyond foramen magnum to the vertebrobasilar junction. This finding corresponds to signal abnormality on MRI of the head and absent enhancement within the V4 segment on MRA of the neck. There is a large left AICA/PICA branch perfusing inferior cerebellum. No evidence of high-grade stenosis, large vessel occlusion, or aneurysm unless noted above. MRA NECK FINDINGS Aortic arch: Patent. Right common carotid artery: Patent. Right internal carotid artery: Patent. Right vertebral artery: Patent. Left common carotid artery: Patent. Left Internal carotid artery: Patent. Left Vertebral artery: There is enhancement within left V1, V2, and V3 segments the decreases in a gradient fashion downstream compatible with slow antegrade flow. The left V4 segment is occluded from the foramen magnum. There is no evidence of hemodynamically significant stenosis by NASCET criteria, occlusion, or aneurysm unless noted above. IMPRESSION: MRI head: No acute intracranial abnormality. Unremarkable MRI of the brain for age. MRA head: 1. 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 2. Otherwise patent anterior and posterior intracranial circulation. MRA neck: 1. Left V4 occlusion. Patent enhancement of left V1, V2, and V3  segments with gradient decreasing downstream enhancement compatible slow antegrade flow. 2. Widely patent bilateral carotid systems and right vertebral artery. These results were called by telephone at the time of interpretation on 04/24/2017 at 10:40 pm to Dr. Orlie Dakin , who verbally acknowledged these results. Electronically Signed   By: Kristine Garbe M.D.   On: 04/24/2017 22:43    EKG: Independently reviewed.  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 TIA due to occlusion / stenosis of L vertebral artery - 1. Per Dr. Rory Percy: 1. ASA, no need for full anticoagulation 2. Complete remainder of stroke work up 3. Stroke team to eval, possibly for formal cerebral angiogram on Monday? 4. Allow permissive HTN 2. Stroke pathway 3. 2d echo 4. Tele monitor 5. PT/OT/SLP - though asymptomatic right now 6. ASA 325 7. Frequent neuro checks 2. HTN - Holding home BP meds and allowing permissive HTN as per Dr. Johny Chess instructions  DVT prophylaxis: Lovenox Code Status: Full Family Communication: No family in room Disposition Plan: Home after admit Consults called: Neurology Admission status: Admit to inpatient - inpatient for stroke work up, possible need for angiography on Monday.     Etta Quill DO [facility] Pager [phone]  If 7AM-7PM, please contact day team taking care of patient [email or web address] Password [email or web address]  04/25/2017, 1:09 AM

## 2017-04-26 ENCOUNTER — Telehealth: Payer: Self-pay

## 2017-04-26 NOTE — Telephone Encounter (Signed)
RN receive a message from [place] in referrals about Dr. Laurann Montana office wanting pt to be seen within 2 weeks with NP. Pt was discharge from hospital on 04/25/2017 by Dr. Erlinda Hong. Rn will call office back to see what the call was about.

## 2017-04-26 NOTE — Telephone Encounter (Signed)
Spoke to Dr. Kelton Pillar and discuss his case. As long as the patient is neurologically stable he can keep his scheduled appointment to see me in 4-6 weeks. He needs to stay on aspirin and Plavix. In case he has new or recurrent neurological symptoms I will try to see him sooner.

## 2017-04-26 NOTE — Telephone Encounter (Signed)
Rn call Dr. Laurann Montana office back and spoke with Beverlee Nims in referrals. Rn stated Dr. Leonie Man spoke with Dr. Laurann Montana about pts hospital stay,and discharge done by Dr Erlinda Hong on 04/25/2017. Beverlee Nims in referrals stated after Dr. Laurann Montana hung with Dr.Sethi she requested pt to be seen sooner. Rn ask if patient was having new sympts neurological wise since speaking  Dr. Leonie Man about an hour ago.PT was seen today by Dr. Laurann Montana for his hospital follow up. Beverlee Nims stated she will let Dr.Grifftin know. Rn stated Dr.Griffin can call back if she has any other questions. Beverlee Nims verbalized understanding.

## 2017-04-28 ENCOUNTER — Telehealth: Payer: Self-pay

## 2017-04-28 NOTE — Telephone Encounter (Signed)
Rn receive a call from [place] at [employer] office. She stated the pt has a cruise  Schedule at the end of March till first week in April 2019. The call was transferred to Dr. Leonie Man. Dr. Leonie Man spoke with Beverlee Nims that he cannot approve the pt to take the cruise because the maximum risk of another stroke is greater the first 3 months. PT wants to be seen sooner because he wants to be approve for the cruise. Dr.SEthi also explain to Beverlee Nims that Dr.XU did not approve the patient to take the cruise. Pt was discharge on 04/25/2017 and was told this by Dr. Rosalin Hawking the other stroke MD. PEr Dr. Erlinda Hong he  explained this to the wife ,son and pt at bedside. Per Dr.SEthi if patient takes the cruise he is taking a risk occurrence of another stroke. Beverlee Nims states she will tell the family or the patient. The nurse was present on this conversation. Beverlee Nims stated she will tell the patient and his wife of Dr. Leonie Man recommendations.

## 2017-05-25 ENCOUNTER — Ambulatory Visit: Payer: Medicare Other | Admitting: Neurology

## 2017-05-25 ENCOUNTER — Encounter: Payer: Self-pay | Admitting: Neurology

## 2017-05-25 ENCOUNTER — Encounter (INDEPENDENT_AMBULATORY_CARE_PROVIDER_SITE_OTHER): Payer: Self-pay

## 2017-05-25 VITALS — BP 134/98 | HR 89 | Ht 68.0 in | Wt 195.2 lb

## 2017-05-25 DIAGNOSIS — I6502 Occlusion and stenosis of left vertebral artery: Secondary | ICD-10-CM

## 2017-05-25 NOTE — Patient Instructions (Signed)
I had a long d/w patient and his wife about his recent TIA and vertebral artery occlusion, risk for recurrent stroke/TIAs, personally independently reviewed imaging studies and stroke evaluation results and answered questions.Continue aspirin and Plavix for 2 more months and then discontinue Plavix and stay on aspirin 325 mg alone for secondary stroke prevention and maintain strict control of hypertension with blood pressure goal below 130/90, diabetes with hemoglobin A1c goal below 6.5% and lipids with LDL cholesterol goal below 70 mg/dL. I also advised the patient to eat a healthy diet with plenty of whole grains, cereals, fruits and vegetables, exercise regularly and maintain ideal body weight .patientwas advised to drive and may do air travel  if necessary.he may consider possible participation in [facility]  stroke prevention trial if interested.Followup in the future with my nurse practitioner Janett Billow in 6 months or call earlier if necessary

## 2017-05-25 NOTE — Progress Notes (Signed)
[facility] [address] [place]. [place] [address] [phone]       OFFICE FOLLOW-UP NOTE  Edward Zavala Date of Birth:  24-Mar-1945 Medical Record Number:  [id number]   HPI:  Mr Zavala is a 77 year Caucasian male seen today for the first office follow-up visit following hospital admission forTIA in March 2019. She is accompanied today by his wife. History is obtained from them and review of electronic medical records. I personally reviewed imaging for lemons and hospital records.  Patient stated that a week ago he was at home watching TV, he had sudden onset left neck and shoulder pain, lasting 35-45 minutes, symptom resolved without any dizziness or nausea vomiting.  This time, he was sitting in a restaurant on the way home from [place], he went to bathroom where he had acute onset vertigo, dizziness, not able to walk, nausea and vomiting.  Sent to our ED where he gradually getting better and now symptoms all resolved.  He got a brain MRI which did not reveal any acute abnormality including a stroke or bleed but he also got a MRA of the brain which was concerning for a thrombus versus dissection in the left V4 segment of the vertebral artery.  This was discussed with the radiologist in detail following which a CT angiogram of the head and neck was performed to further evaluate this area of abnormality. MRA of the neck suggested left vertebral artery occlusion in the V4 segment. Transthoracic echo showed normal ejection fraction. LDL cholesterol 52 mg percent. Hemoglobin A1c was 5.2.his blood pressures were elevated with systolics in the 664Q.he was started on dual antiplatelet therapy aspirin and Plavix. LKW: 10 AM on 04/24/2017 tpa given?: no, outside the window Premorbid modified Rankin scale (mRS): 0       He denies any trauma, aggressive exercises, or neck had abrupt movement.atient states his done well since discharge. His dizziness improved within a few hours after  hospitalization. He has been careful occasionally may feel a little dizzy when he moves his head suddenly. He denies any trouble with balance and speech or walking. His tolerate aspirin and Plavix but does get easy bruising. He is had no bleeding episodes. States his blood pressure is well controlled and today it is 139/90. The patient and education planned on a cruise in has brought in paperwork to cancel it. He plans to go on a trip to [place] in a few months and is asking for clearance.   ROS:   14 system review of systems is positive for  Headache, easy bruising, urination problems, importance, ringing in the ears, anxiety and all systems negative  PMH:  Past Medical History:  Diagnosis Date  . Anxiety   . Depression   . Dyslipidemia (high LDL; low HDL)   . Gastroparesis   . GERD (gastroesophageal reflux disease)   . H/O acute pancreatitis   . H/O gastroesophageal reflux (GERD)   . Headache(784.0)    occasinal   . Hypertension   . Stroke [facility])     Social History:  Social History   Socioeconomic History  . Marital status: Married    Spouse name: Not on file  . Number of children: Not on file  . Years of education: Not on file  . Highest education level: Not on file  Occupational History  . Not on file  Social Needs  . Financial resource strain: Not on file  . Food insecurity:    Worry: Not on file  Inability: Not on file  . Transportation needs:    Medical: Not on file    Non-medical: Not on file  Tobacco Use  . Smoking status: Former Smoker    Types: Cigarettes    Last attempt to quit: 09/30/1976    Years since quitting: 40.6  . Smokeless tobacco: Never Used  Substance and Sexual Activity  . Alcohol use: Yes    Alcohol/week: 2.4 oz    Types: 4 Glasses of wine per week    Comment: occasionally, pt drinks bourban monthly  . Drug use: No  . Sexual activity: Not on file  Lifestyle  . Physical activity:    Days per week: Not on file    Minutes per session: Not  on file  . Stress: Not on file  Relationships  . Social connections:    Talks on phone: Not on file    Gets together: Not on file    Attends religious service: Not on file    Active member of club or organization: Not on file    Attends meetings of clubs or organizations: Not on file    Relationship status: Not on file  . Intimate partner violence:    Fear of current or ex partner: Not on file    Emotionally abused: Not on file    Physically abused: Not on file    Forced sexual activity: Not on file  Other Topics Concern  . Not on file  Social History Narrative  . Not on file    Medications:   Current Outpatient Medications on File Prior to Visit  Medication Sig Dispense Refill  . aspirin 325 MG tablet Take 1 tablet (325 mg total) by mouth daily. 30 tablet 1  . atorvastatin (LIPITOR) 80 MG tablet Take 1 tablet (80 mg total) by mouth daily. (Patient taking differently: Take 20 mg by mouth daily. ) 30 tablet 1  . buPROPion (WELLBUTRIN XL) 150 MG 24 hr tablet Take 300 mg by mouth daily before breakfast.     . busPIRone (BUSPAR) 15 MG tablet Take 15 mg by mouth daily.    . butalbital-aspirin-caffeine (FIORINAL) 50-325-40 MG per capsule Take 1 capsule by mouth as needed. Headache    . clopidogrel (PLAVIX) 75 MG tablet Take 1 tablet (75 mg total) by mouth daily. 30 tablet 1  . erythromycin (ERY-TAB) 250 MG EC tablet Take 250 mg by mouth as needed.    . hydroxypropyl methylcellulose (ISOPTO TEARS) 2.5 % ophthalmic solution Place 1 drop into both eyes as needed. Dry eyes    . lisinopril (PRINIVIL,ZESTRIL) 40 MG tablet Take 40 mg by mouth 2 (two) times daily.     Marland Kitchen omeprazole (PRILOSEC) 40 MG capsule Take 40 mg by mouth daily.     . ondansetron (ZOFRAN) 4 MG tablet Take 4 mg by mouth every 8 (eight) hours as needed for nausea or vomiting.    Marland Kitchen REXULTI 2 MG TABS Take 2 mg by mouth daily.    . Sildenafil Citrate (VIAGRA PO) Take 60 mg by mouth.     No current facility-administered  medications on file prior to visit.     Allergies:  No Known Allergies  Physical Exam General: well developed, well nourished pleasant elderly Caucasian male, seated, in no evident distress Head: head normocephalic and atraumatic.  Neck: supple with no carotid or supraclavicular bruits Cardiovascular: regular rate and rhythm, no murmurs Musculoskeletal: no deformity Skin:  no rash/petichiae Vascular:  Normal pulses all extremities Vitals:   05/25/17 1326  BP: (!) 134/98  Pulse: 89   Neurologic Exam Mental Status: Awake and fully alert. Oriented to place and time. Recent and remote memory intact. Attention span, concentration and fund of knowledge appropriate. Mood and affect appropriate.  Cranial Nerves: Fundoscopic exam reveals sharp disc margins. Pupils equal, briskly reactive to light. Extraocular movements full without nystagmus. Visual fields full to confrontation. Hearing intact. Facial sensation intact. Face, tongue, palate moves normally and symmetrically.  Motor: Normal bulk and tone. Normal strength in all tested extremity muscles. Sensory.: intact to touch ,pinprick .position and vibratory sensation.  Coordination: Rapid alternating movements normal in all extremities. Finger-to-nose and heel-to-shin performed accurately bilaterally. Gait and Station: Arises from chair without difficulty. Stance is normal. Gait demonstrates normal stride length and balance . Able to heel, toe and tandem walk without difficulty.  Reflexes: 1+ and symmetric. Toes downgoing.   NIHSS 0 Modified Rankin  1   ASSESSMENT: 39 year caucasian male with episode of sudden onset of dizziness and vertigo due to left vertebral artery occlusion etiology indeterminate dissection versus atherosclerotic occlusion. Vascular risk factors of hyperlipidemia and hypertension   PLAN: I had a long d/w patient and his wife about his recent TIA and vertebral artery occlusion, risk for recurrent stroke/TIAs,  personally independently reviewed imaging studies and stroke evaluation results and answered questions.Continue aspirin and Plavix for 2 more months and then discontinue Plavix and stay on aspirin 325 mg alone for secondary stroke prevention and maintain strict control of hypertension with blood pressure goal below 130/90, diabetes with hemoglobin A1c goal below 6.5% and lipids with LDL cholesterol goal below 70 mg/dL. I also advised the patient to eat a healthy diet with plenty of whole grains, cereals, fruits and vegetables, exercise regularly and maintain ideal body weight .patientwas advised to drive and may do air travel  if necessary.he may consider possible participation in [facility]  stroke prevention trial if interested.Followup in the future with my nurse practitioner Janett Billow in 6 months or call earlier if necessary Greater than 50% of time during this 25 minute visit was spent on counseling,explanation of diagnosis off TIA and left vertebral artery occlusion, planning of further management, discussion with patient and family and coordination of care Antony Contras, MD  [facility] [address] [place], [place] [address]  Phone [phone] Fax [phone] Note: This document was prepared with digital dictation and possible smart phrase technology. Any transcriptional errors that result from this process are unintentional

## 2017-05-26 ENCOUNTER — Telehealth: Payer: Self-pay

## 2017-05-26 NOTE — Telephone Encounter (Signed)
Form for travel insured international/attending physician form done for patient. Pt had cruise plan last week. Pt had to cancel because of stroke on 04/24/2017. Pt was advised by Dr.SEthi,and Dr. Erlinda Hong that he needed to cancel because of the high recurrence of another stroke.Fee will be waived of 50.00 per Dr. Leonie Man. This form is done so pt can be reimburse for his cruise trip.Form fax twice to [phone] and confirmed. Copy sent to medical records.

## 2017-11-25 ENCOUNTER — Ambulatory Visit: Payer: Medicare Other | Admitting: Adult Health

## 2017-11-25 ENCOUNTER — Encounter: Payer: Self-pay | Admitting: Adult Health

## 2017-11-25 VITALS — BP 127/92 | HR 87 | Wt 197.2 lb

## 2017-11-25 DIAGNOSIS — E785 Hyperlipidemia, unspecified: Secondary | ICD-10-CM | POA: Diagnosis not present

## 2017-11-25 DIAGNOSIS — I10 Essential (primary) hypertension: Secondary | ICD-10-CM

## 2017-11-25 DIAGNOSIS — G450 Vertebro-basilar artery syndrome: Secondary | ICD-10-CM | POA: Diagnosis not present

## 2017-11-25 DIAGNOSIS — I6502 Occlusion and stenosis of left vertebral artery: Secondary | ICD-10-CM

## 2017-11-25 NOTE — Progress Notes (Signed)
I agree with the above plan 

## 2017-11-25 NOTE — Progress Notes (Signed)
[facility] [address] [place]. [place] [address] [phone]       OFFICE FOLLOW-UP NOTE  Edward. Edward Zavala Date of Birth:  1945/08/31 Medical Record Number:  [id number]   HPI:  Edward Zavala is a 22 year Caucasian male seen today in the office follow-up visit following hospital admission forTIA with occlusion of left VA in March 2019.  History is obtained from patient and review of electronic medical records. I personally reviewed imaging for lemons and hospital records.    05/25/2017 initial visit PS: Patient stated that a week ago he was at home watching TV, he had sudden onset left neck and shoulder pain, lasting 35-45 minutes, symptom resolved without any dizziness or nausea vomiting.  This time, he was sitting in a restaurant on the way home from [place], he went to bathroom where he had acute onset vertigo, dizziness, not able to walk, nausea and vomiting.  Sent to our ED where he gradually getting better and now symptoms all resolved.  He got a brain MRI which did not reveal any acute abnormality including a stroke or bleed but he also got a MRA of the brain which was concerning for a thrombus versus dissection in the left V4 segment of the vertebral artery.  This was discussed with the radiologist in detail following which a CT angiogram of the head and neck was performed to further evaluate this area of abnormality. MRA of the neck suggested left vertebral artery occlusion in the V4 segment. Transthoracic echo showed normal ejection fraction. LDL cholesterol 52 mg percent. Hemoglobin A1c was 5.2.his blood pressures were elevated with systolics in the 947M.he was started on dual antiplatelet therapy aspirin and Plavix.  He denies any trauma, aggressive exercises, or neck had abrupt movement.atient states his done well since discharge. His dizziness improved within a few hours after hospitalization. He has been careful occasionally may feel a little dizzy when he moves his head  suddenly. He denies any trouble with balance and speech or walking. His tolerate aspirin and Plavix but does get easy bruising. He is had no bleeding episodes. States his blood pressure is well controlled and today it is 139/90. The patient and education planned on a cruise in has brought in paperwork to cancel it. He plans to go on a trip to [place] in a few months and is asking for clearance.  Interval history 11/25/2017: Patient is being seen today for scheduled follow-up visit.  He states overall he continues to do well.  Denies any recent headache or neck/shoulder pain.  He does complain of intermittent dizziness with quick position changes but he does believe this is due to his Cymbalta as he is not experiencing this dizziness until he stopped his Wellbutrin and started Cymbalta.  He does believe that it has been improving some and the dizziness sensation only lasts for a few seconds.  He continues to take aspirin 325 mg without side effects of bleeding or bruising.  He continues to take Lipitor but did have dose decreased recently from 80 mg to 20 mg by his PCP as cholesterol levels stable.  Blood pressure today satisfactory 127/92.  He continues to stay active and maintains a healthy diet.  Denies new or worsening stroke/TIA symptoms.    ROS:   14 system review of systems is positive for ringing in ears, dizziness, depression and nervous/anxious and all systems negative  PMH:  Past Medical History:  Diagnosis Date  . Anxiety   . Depression   .  Dyslipidemia (high LDL; low HDL)   . Gastroparesis   . GERD (gastroesophageal reflux disease)   . H/O acute pancreatitis   . H/O gastroesophageal reflux (GERD)   . Headache(784.0)    occasinal   . Hypertension   . Stroke [facility])     Social History:  Social History   Socioeconomic History  . Marital status: Married    Spouse name: Not on file  . Number of children: Not on file  . Years of education: Not on file  . Highest education level:  Not on file  Occupational History  . Not on file  Social Needs  . Financial resource strain: Not on file  . Food insecurity:    Worry: Not on file    Inability: Not on file  . Transportation needs:    Medical: Not on file    Non-medical: Not on file  Tobacco Use  . Smoking status: Former Smoker    Types: Cigarettes    Last attempt to quit: 09/30/1976    Years since quitting: 41.1  . Smokeless tobacco: Never Used  Substance and Sexual Activity  . Alcohol use: Yes    Alcohol/week: 4.0 standard drinks    Types: 4 Glasses of wine per week    Comment: occasionally, pt drinks bourban monthly  . Drug use: No  . Sexual activity: Not on file  Lifestyle  . Physical activity:    Days per week: Not on file    Minutes per session: Not on file  . Stress: Not on file  Relationships  . Social connections:    Talks on phone: Not on file    Gets together: Not on file    Attends religious service: Not on file    Active member of club or organization: Not on file    Attends meetings of clubs or organizations: Not on file    Relationship status: Not on file  . Intimate partner violence:    Fear of current or ex partner: Not on file    Emotionally abused: Not on file    Physically abused: Not on file    Forced sexual activity: Not on file  Other Topics Concern  . Not on file  Social History Narrative  . Not on file    Medications:   Current Outpatient Medications on File Prior to Visit  Medication Sig Dispense Refill  . aspirin 325 MG tablet Take 1 tablet (325 mg total) by mouth daily. 30 tablet 1  . atorvastatin (LIPITOR) 20 MG tablet     . busPIRone (BUSPAR) 15 MG tablet Take 15 mg by mouth daily.    . butalbital-aspirin-caffeine (FIORINAL) 50-325-40 MG per capsule Take 1 capsule by mouth as needed. Headache    . DULoxetine (CYMBALTA) 30 MG capsule     . erythromycin (ERY-TAB) 250 MG EC tablet Take 250 mg by mouth as needed.    . hydroxypropyl methylcellulose (ISOPTO TEARS) 2.5 %  ophthalmic solution Place 1 drop into both eyes as needed. Dry eyes    . lisinopril (PRINIVIL,ZESTRIL) 40 MG tablet Take 40 mg by mouth 2 (two) times daily.     Marland Kitchen omeprazole (PRILOSEC) 40 MG capsule Take 40 mg by mouth daily.     . ondansetron (ZOFRAN) 4 MG tablet Take 4 mg by mouth every 8 (eight) hours as needed for nausea or vomiting.    Marland Kitchen REXULTI 2 MG TABS Take 2 mg by mouth daily.    . Sildenafil Citrate (VIAGRA PO) Take 60  mg by mouth.     No current facility-administered medications on file prior to visit.     Allergies:  No Known Allergies  Physical Exam General: well developed, well nourished pleasant elderly Caucasian male, seated, in no evident distress Head: head normocephalic and atraumatic.  Neck: supple with no carotid or supraclavicular bruits Cardiovascular: regular rate and rhythm, no murmurs Musculoskeletal: no deformity Skin:  no rash/petichiae Vascular:  Normal pulses all extremities Vitals:   11/25/17 0848  BP: (!) 127/92  Pulse: 87   Neurologic Exam Mental Status: Awake and fully alert. Oriented to place and time. Recent and remote memory intact. Attention span, concentration and fund of knowledge appropriate. Mood and affect appropriate.  Cranial Nerves: Fundoscopic exam reveals sharp disc margins. Pupils equal, briskly reactive to light. Extraocular movements full without nystagmus. Visual fields full to confrontation. Hearing intact. Facial sensation intact. Face, tongue, palate moves normally and symmetrically.  Motor: Normal bulk and tone. Normal strength in all tested extremity muscles. Sensory.: intact to touch ,pinprick .position and vibratory sensation.  Coordination: Rapid alternating movements normal in all extremities. Finger-to-nose and heel-to-shin performed accurately bilaterally. Gait and Station: Arises from chair without difficulty. Stance is normal. Gait demonstrates normal stride length and balance . Able to heel, toe and tandem walk without  difficulty.  Romberg negative. Reflexes: 1+ and symmetric. Toes downgoing.     ASSESSMENT: 13 year caucasian male with episode of sudden onset of dizziness and vertigo due to left vertebral artery occlusion etiology indeterminate dissection versus atherosclerotic occlusion. Vascular risk factors of hyperlipidemia and hypertension.  Patient is being seen today for follow-up visit and overall continues to do well with only intermittent positional vertigo which patient believes is due to his Cymbalta.   PLAN: -Continue aspirin 325 mg daily  and Lipitor 20 mg for secondary stroke prevention -F/u with PCP regarding your HLD and HTN management -continue to monitor BP at home -Advised to continue to stay active and maintain a healthy diet -Advised patient that if positional vertigo does not subside or worsens, to notify us or his PCP as this could be due to BPPV, orthostatic hypotension or previously found left VA occlusion -Maintain strict control of hypertension with blood pressure goal below 130/90, diabetes with hemoglobin A1c goal below 6.5% and cholesterol with LDL cholesterol (bad cholesterol) goal below 70 mg/dL. I also advised the patient to eat a healthy diet with plenty of whole grains, cereals, fruits and vegetables, exercise regularly and maintain ideal body weight.  Follow up in 6 months or call earlier if needed with questions, concerns or need of sooner follow-up appointment  Greater than 50% of time during this 25 minute visit was spent on counseling,explanation of diagnosis off TIA and left vertebral artery occlusion, planning of further management, discussion with patient and family and coordination of care  Venancio Poisson, AGNP-BC  [facility] [address] [place], [place] [address]  Phone [phone] Fax [phone] Note: This document was prepared with digital dictation and possible smart phrase technology. Any transcriptional errors  that result from this process are unintentional.

## 2017-11-25 NOTE — Patient Instructions (Signed)
Continue aspirin 325 mg daily  and lipitor 20mg   for secondary stroke prevention  Continue to follow up with PCP regarding cholesterol and blood pressure management   Continue to stay active and maintain a healthy diet  Continue to monitor blood pressure at home  Maintain strict control of hypertension with blood pressure goal below 130/90, diabetes with hemoglobin A1c goal below 6.5% and cholesterol with LDL cholesterol (bad cholesterol) goal below 70 mg/dL. I also advised the patient to eat a healthy diet with plenty of whole grains, cereals, fruits and vegetables, exercise regularly and maintain ideal body weight.  Followup in the future with me in 6 months or call earlier if needed       Thank you for coming to see [place] at [facility]. I hope we have been able to provide you high quality care today.  You may receive a patient satisfaction survey over the next few weeks. We would appreciate your feedback and comments so that we may continue to improve ourselves and the health of our patients.

## 2018-05-27 ENCOUNTER — Ambulatory Visit: Payer: Medicare Other | Admitting: Adult Health

## 2018-06-27 ENCOUNTER — Telehealth: Payer: Self-pay

## 2018-06-27 NOTE — Telephone Encounter (Signed)
If pt calls back please get verbal consent to do video and to file insurance. Please ask if he wants [place] to text link to his cell phone with the camera on it. We can also email him the link if he has a lap top or ipad that has a camera on it. The text message will be sent 10 to 5 minutes prior to the visit by provider.  LEft vm that we are doing video visit due to COVID 19.

## 2018-06-27 NOTE — Telephone Encounter (Signed)
Pt called in and provided consent for video visit, pt gives consent for insurance to be billed also.  [phone] [employer]  [email or web address]

## 2018-06-27 NOTE — Telephone Encounter (Signed)
Pt will be email link day of visit and text.

## 2018-06-28 ENCOUNTER — Ambulatory Visit (INDEPENDENT_AMBULATORY_CARE_PROVIDER_SITE_OTHER): Payer: Self-pay | Admitting: Adult Health

## 2018-06-28 ENCOUNTER — Other Ambulatory Visit: Payer: Self-pay

## 2018-06-28 DIAGNOSIS — Z0289 Encounter for other administrative examinations: Secondary | ICD-10-CM

## 2018-06-28 NOTE — Telephone Encounter (Signed)
RN email link today at 853am for doxy video visit.

## 2018-06-29 ENCOUNTER — Ambulatory Visit: Payer: Self-pay | Admitting: Adult Health

## 2018-07-26 ENCOUNTER — Other Ambulatory Visit (HOSPITAL_COMMUNITY): Payer: Self-pay | Admitting: Cardiology

## 2018-07-26 DIAGNOSIS — R079 Chest pain, unspecified: Secondary | ICD-10-CM

## 2018-08-03 ENCOUNTER — Encounter (HOSPITAL_COMMUNITY)
Admission: RE | Admit: 2018-08-03 | Discharge: 2018-08-03 | Disposition: A | Discharge status: Home / Self Care | Payer: Medicare Other | Source: Ambulatory Visit | Attending: Cardiology | Admitting: Cardiology

## 2018-08-03 ENCOUNTER — Other Ambulatory Visit: Payer: Self-pay

## 2018-08-03 DIAGNOSIS — R079 Chest pain, unspecified: Secondary | ICD-10-CM | POA: Diagnosis present

## 2018-08-03 MED ORDER — REGADENOSON 0.4 MG/5ML IV SOLN
0.4000 mg | Freq: Once | INTRAVENOUS | Status: AC
  Administered 2018-08-03: 0.4 mg via INTRAVENOUS

## 2018-08-03 MED ORDER — TECHNETIUM TC 99M TETROFOSMIN IV KIT
30.0000 | PACK | Freq: Once | INTRAVENOUS | Status: AC | PRN
  Administered 2018-08-03: 12:00:00 30 via INTRAVENOUS

## 2018-08-03 MED ORDER — TECHNETIUM TC 99M TETROFOSMIN IV KIT
10.0000 | PACK | Freq: Once | INTRAVENOUS | Status: AC | PRN
  Administered 2018-08-03: 10:00:00 10 via INTRAVENOUS

## 2018-08-03 MED ORDER — REGADENOSON 0.4 MG/5ML IV SOLN
INTRAVENOUS | Status: AC
  Administered 2018-08-03: 0.4 mg via INTRAVENOUS
  Filled 2018-08-03: qty 5

## 2019-03-18 ENCOUNTER — Ambulatory Visit: Payer: Medicare Other

## 2019-03-23 ENCOUNTER — Ambulatory Visit: Payer: Medicare PPO

## 2019-03-29 ENCOUNTER — Ambulatory Visit: Payer: Medicare Other

## 2019-06-21 DIAGNOSIS — R3121 Asymptomatic microscopic hematuria: Secondary | ICD-10-CM | POA: Diagnosis not present

## 2019-06-25 DIAGNOSIS — S61412A Laceration without foreign body of left hand, initial encounter: Secondary | ICD-10-CM | POA: Diagnosis not present

## 2019-06-25 DIAGNOSIS — S80211A Abrasion, right knee, initial encounter: Secondary | ICD-10-CM | POA: Diagnosis not present

## 2019-06-29 DIAGNOSIS — R3121 Asymptomatic microscopic hematuria: Secondary | ICD-10-CM | POA: Diagnosis not present

## 2019-06-29 DIAGNOSIS — K4020 Bilateral inguinal hernia, without obstruction or gangrene, not specified as recurrent: Secondary | ICD-10-CM | POA: Diagnosis not present

## 2019-06-29 DIAGNOSIS — R3129 Other microscopic hematuria: Secondary | ICD-10-CM | POA: Diagnosis not present

## 2019-07-07 ENCOUNTER — Other Ambulatory Visit: Payer: Self-pay | Admitting: Gastroenterology

## 2019-07-07 DIAGNOSIS — K869 Disease of pancreas, unspecified: Secondary | ICD-10-CM

## 2019-07-12 DIAGNOSIS — R932 Abnormal findings on diagnostic imaging of liver and biliary tract: Secondary | ICD-10-CM | POA: Diagnosis not present

## 2019-07-12 DIAGNOSIS — K2100 Gastro-esophageal reflux disease with esophagitis, without bleeding: Secondary | ICD-10-CM | POA: Diagnosis not present

## 2019-07-12 DIAGNOSIS — K2270 Barrett's esophagus without dysplasia: Secondary | ICD-10-CM | POA: Diagnosis not present

## 2019-07-23 ENCOUNTER — Ambulatory Visit
Admission: RE | Admit: 2019-07-23 | Discharge: 2019-07-23 | Disposition: A | Discharge status: Home / Self Care | Payer: Medicare PPO | Source: Ambulatory Visit | Attending: Gastroenterology | Admitting: Gastroenterology

## 2019-07-23 DIAGNOSIS — K862 Cyst of pancreas: Secondary | ICD-10-CM | POA: Diagnosis not present

## 2019-07-23 DIAGNOSIS — K869 Disease of pancreas, unspecified: Secondary | ICD-10-CM

## 2019-07-23 MED ORDER — GADOBENATE DIMEGLUMINE 529 MG/ML IV SOLN
15.0000 mL | Freq: Once | INTRAVENOUS | Status: AC | PRN
  Administered 2019-07-23: 15 mL via INTRAVENOUS

## 2019-07-25 ENCOUNTER — Other Ambulatory Visit: Payer: Self-pay | Admitting: Gastroenterology

## 2019-07-31 DIAGNOSIS — R3912 Poor urinary stream: Secondary | ICD-10-CM | POA: Diagnosis not present

## 2019-07-31 DIAGNOSIS — N401 Enlarged prostate with lower urinary tract symptoms: Secondary | ICD-10-CM | POA: Diagnosis not present

## 2019-08-03 DIAGNOSIS — F3341 Major depressive disorder, recurrent, in partial remission: Secondary | ICD-10-CM | POA: Diagnosis not present

## 2019-08-04 ENCOUNTER — Other Ambulatory Visit (HOSPITAL_COMMUNITY)
Admission: RE | Admit: 2019-08-04 | Discharge: 2019-08-04 | Disposition: A | Discharge status: Home / Self Care | Payer: Medicare PPO | Source: Ambulatory Visit | Attending: Gastroenterology | Admitting: Gastroenterology

## 2019-08-04 DIAGNOSIS — Z01812 Encounter for preprocedural laboratory examination: Secondary | ICD-10-CM | POA: Insufficient documentation

## 2019-08-04 DIAGNOSIS — R351 Nocturia: Secondary | ICD-10-CM | POA: Diagnosis not present

## 2019-08-04 DIAGNOSIS — N401 Enlarged prostate with lower urinary tract symptoms: Secondary | ICD-10-CM | POA: Diagnosis not present

## 2019-08-04 DIAGNOSIS — Z20822 Contact with and (suspected) exposure to covid-19: Secondary | ICD-10-CM | POA: Insufficient documentation

## 2019-08-04 LAB — SARS CORONAVIRUS 2 (TAT 6-24 HRS): SARS Coronavirus 2: NEGATIVE

## 2019-08-07 ENCOUNTER — Other Ambulatory Visit: Payer: Medicare PPO

## 2019-08-07 NOTE — Progress Notes (Signed)
Pre call done. Covid test negative. Instructed patient to arrive to the hospital by 12:00 for 1:30 procedure. Instructed to not eat/drink past midnight. Patient will take BP meds with a sip of water.

## 2019-08-08 ENCOUNTER — Ambulatory Visit (HOSPITAL_COMMUNITY): Payer: Medicare PPO | Admitting: Certified Registered"

## 2019-08-08 ENCOUNTER — Ambulatory Visit (HOSPITAL_COMMUNITY)
Admission: RE | Admit: 2019-08-08 | Discharge: 2019-08-08 | Disposition: A | Discharge status: Home / Self Care | Payer: Medicare PPO | Attending: Gastroenterology | Admitting: Gastroenterology

## 2019-08-08 ENCOUNTER — Other Ambulatory Visit: Payer: Self-pay

## 2019-08-08 ENCOUNTER — Encounter (HOSPITAL_COMMUNITY): Payer: Self-pay | Admitting: Gastroenterology

## 2019-08-08 ENCOUNTER — Encounter (HOSPITAL_COMMUNITY)
Admission: RE | Disposition: A | Discharge status: Home / Self Care | Payer: Self-pay | Source: Home / Self Care | Attending: Gastroenterology

## 2019-08-08 DIAGNOSIS — Z85828 Personal history of other malignant neoplasm of skin: Secondary | ICD-10-CM | POA: Diagnosis not present

## 2019-08-08 DIAGNOSIS — F418 Other specified anxiety disorders: Secondary | ICD-10-CM | POA: Diagnosis not present

## 2019-08-08 DIAGNOSIS — F329 Major depressive disorder, single episode, unspecified: Secondary | ICD-10-CM | POA: Insufficient documentation

## 2019-08-08 DIAGNOSIS — I10 Essential (primary) hypertension: Secondary | ICD-10-CM | POA: Diagnosis not present

## 2019-08-08 DIAGNOSIS — Z79899 Other long term (current) drug therapy: Secondary | ICD-10-CM | POA: Diagnosis not present

## 2019-08-08 DIAGNOSIS — K862 Cyst of pancreas: Secondary | ICD-10-CM | POA: Diagnosis not present

## 2019-08-08 DIAGNOSIS — Z87891 Personal history of nicotine dependence: Secondary | ICD-10-CM | POA: Diagnosis not present

## 2019-08-08 DIAGNOSIS — N400 Enlarged prostate without lower urinary tract symptoms: Secondary | ICD-10-CM | POA: Diagnosis not present

## 2019-08-08 DIAGNOSIS — E7800 Pure hypercholesterolemia, unspecified: Secondary | ICD-10-CM | POA: Insufficient documentation

## 2019-08-08 DIAGNOSIS — Z9049 Acquired absence of other specified parts of digestive tract: Secondary | ICD-10-CM | POA: Diagnosis not present

## 2019-08-08 DIAGNOSIS — K3184 Gastroparesis: Secondary | ICD-10-CM | POA: Diagnosis not present

## 2019-08-08 DIAGNOSIS — F419 Anxiety disorder, unspecified: Secondary | ICD-10-CM | POA: Insufficient documentation

## 2019-08-08 DIAGNOSIS — Z8673 Personal history of transient ischemic attack (TIA), and cerebral infarction without residual deficits: Secondary | ICD-10-CM | POA: Insufficient documentation

## 2019-08-08 DIAGNOSIS — D136 Benign neoplasm of pancreas: Secondary | ICD-10-CM | POA: Insufficient documentation

## 2019-08-08 DIAGNOSIS — K2100 Gastro-esophageal reflux disease with esophagitis, without bleeding: Secondary | ICD-10-CM | POA: Diagnosis not present

## 2019-08-08 DIAGNOSIS — Z7982 Long term (current) use of aspirin: Secondary | ICD-10-CM | POA: Insufficient documentation

## 2019-08-08 DIAGNOSIS — R9389 Abnormal findings on diagnostic imaging of other specified body structures: Secondary | ICD-10-CM | POA: Diagnosis not present

## 2019-08-08 DIAGNOSIS — E785 Hyperlipidemia, unspecified: Secondary | ICD-10-CM | POA: Diagnosis not present

## 2019-08-08 HISTORY — PX: FINE NEEDLE ASPIRATION: SHX5430

## 2019-08-08 HISTORY — PX: EUS: SHX5427

## 2019-08-08 HISTORY — PX: ESOPHAGOGASTRODUODENOSCOPY (EGD) WITH PROPOFOL: SHX5813

## 2019-08-08 SURGERY — UPPER ENDOSCOPIC ULTRASOUND (EUS) LINEAR
Anesthesia: Monitor Anesthesia Care

## 2019-08-08 MED ORDER — LIDOCAINE 2% (20 MG/ML) 5 ML SYRINGE
INTRAMUSCULAR | Status: DC | PRN
  Administered 2019-08-08: 60 mg via INTRAVENOUS

## 2019-08-08 MED ORDER — LACTATED RINGERS IV SOLN
INTRAVENOUS | Status: AC | PRN
  Administered 2019-08-08: 1000 mL via INTRAVENOUS

## 2019-08-08 MED ORDER — PROPOFOL 500 MG/50ML IV EMUL
INTRAVENOUS | Status: AC
  Filled 2019-08-08: qty 50

## 2019-08-08 MED ORDER — PROPOFOL 500 MG/50ML IV EMUL
INTRAVENOUS | Status: DC | PRN
  Administered 2019-08-08: 120 ug/kg/min via INTRAVENOUS

## 2019-08-08 MED ORDER — CIPROFLOXACIN HCL 500 MG PO TABS
500.0000 mg | ORAL_TABLET | Freq: Two times a day (BID) | ORAL | 0 refills | Status: AC
Start: 2019-08-08 — End: 2019-08-11

## 2019-08-08 MED ORDER — SODIUM CHLORIDE 0.9 % IV SOLN: INTRAVENOUS | Status: DC

## 2019-08-08 MED ORDER — CIPROFLOXACIN IN D5W 400 MG/200ML IV SOLN
INTRAVENOUS | Status: AC
  Filled 2019-08-08: qty 200

## 2019-08-08 MED ORDER — PROPOFOL 10 MG/ML IV BOLUS
INTRAVENOUS | Status: DC | PRN
  Administered 2019-08-08 (×3): 20 mg via INTRAVENOUS

## 2019-08-08 MED ORDER — CIPROFLOXACIN IN D5W 400 MG/200ML IV SOLN
INTRAVENOUS | Status: DC | PRN
  Administered 2019-08-08: 400 mg via INTRAVENOUS

## 2019-08-08 NOTE — Transfer of Care (Signed)
Immediate Anesthesia Transfer of Care Note  Patient: Wonda Cheng  Procedure(s) Performed: UPPER ENDOSCOPIC ULTRASOUND (EUS) LINEAR (N/A ) FINE NEEDLE ASPIRATION (FNA) LINEAR (N/A )  Patient Location: PACU  Anesthesia Type:MAC  Level of Consciousness: awake, alert  and oriented  Airway & Oxygen Therapy: Patient Spontanous Breathing and Patient connected to face mask oxygen  Post-op Assessment: Report given to RN, Post -op Vital signs reviewed and stable and Patient moving all extremities X 4  Post vital signs: Reviewed and stable  Last Vitals:  Vitals Value Taken Time  BP    Temp    Pulse 91 08/08/19 1421  Resp    SpO2 95 % 08/08/19 1421  Vitals shown include unvalidated device data.  Last Pain:  Vitals:   08/08/19 1228  TempSrc: Oral  PainSc: 0-No pain         Complications: No complications documented.

## 2019-08-08 NOTE — Op Note (Signed)
[facility] Patient Name: Edward Zavala Procedure Date: 08/08/2019 MRN: [id number] Attending MD: Arta Silence , MD Date of Birth: November 22, 1945 CSN: [id number] Age: 74 Admit Type: Outpatient Procedure:                Upper EUS Indications:              Pancreatic cyst on MRI Providers:                Arta Silence, MD, Elmer Ramp. Tilden Dome, RN, Laverda Sorenson, Technician, Tyrone Apple, Technician,                            Maudry Diego, CRNA Referring MD:             Dr. Clarene Essex Medicines:                Monitored Anesthesia Care, Cipro 443 mg IV Complications:            No immediate complications. Estimated Blood Loss:     Estimated blood loss was minimal. Procedure:                Pre-Anesthesia Assessment:                           - Prior to the procedure, a History and Physical                            was performed, and patient medications and                            allergies were reviewed. The patient's tolerance of                            previous anesthesia was also reviewed. The risks                            and benefits of the procedure and the sedation                            options and risks were discussed with the patient.                            All questions were answered, and informed consent                            was obtained. Prior Anticoagulants: The patient has                            taken no previous anticoagulant or antiplatelet                            agents. ASA Grade Assessment: II - A patient with  mild systemic disease. After reviewing the risks                            and benefits, the patient was deemed in                            satisfactory condition to undergo the procedure.                           After obtaining informed consent, the endoscope was                            passed under direct vision. Throughout the                             procedure, the patient's blood pressure, pulse, and                            oxygen saturations were monitored continuously. The                            [id number] ([id number]) Olympus Linear EUS was                            introduced through the mouth, and advanced to the                            second part of duodenum. The upper EUS was                            accomplished without difficulty. The patient                            tolerated the procedure well. Scope In: Scope Out: Findings:      ENDOSONOGRAPHIC FINDING: :      There was no sign of significant endosonographic abnormality in the left       lobe of the liver.      There was no sign of significant endosonographic abnormality in the       ampulla.      There was no sign of significant endosonographic abnormality in the       common bile duct.      Evidence of a previous cholecystectomy was identified       endosonographically.      No lymphadenopathy seen.      A multicystic lesion suggestive of a cyst was identified at the junction       of the pancreatic head and genu of the pancreas, best seen with EUS       scope in pre-pyloric antrum. It is not in obvious communication with the       pancreatic duct. The lesion measured 23 mm by 22 mm in maximal       cross-sectional diameter. There were many compartments without septae.       The outer wall of the lesion was not seen. There was no associated mass.       There was  no internal debris within the fluid-filled cavity. Fine needle       aspiration for cytology was performed. Color Doppler imaging was       utilized prior to needle puncture to confirm a lack of significant       vascular structures within the needle path. Two passes were made with       the 25 gauge needle using a transgastric approach. A stylet was used. A       preliminary cytologic examination was not performed. Final cytology       results are pending.      Remainder of pancreatic  parenchyma normal. No features of chronic       pancreatitis.      Normal-appearing pancreatic duct. Impression:               - There was no evidence of significant pathology in                            the left lobe of the liver.                           - There was no sign of significant pathology in the                            ampulla.                           - There was no sign of significant pathology in the                            common bile duct.                           - Evidence of a cholecystectomy.                           - A cystic lesion was seen in the pancreatic head                            and genu of the pancreas. Cytology results are                            pending. However, the endosonographic appearance is                            most consistent with a serous cystadenoma. Fine                            needle aspiration performed. Moderate Sedation:      None Recommendation:           - Discharge patient to home (via wheelchair).                           - Resume previous diet today.                           -  Continue present medications.                           - Await cytology results.                           - Return to referring physician as previously                            scheduled. Procedure Code(s):        --- Professional ---                           [phone], Esophagogastroduodenoscopy, flexible,                            transoral; with transendoscopic ultrasound-guided                            intramural or transmural fine needle                            aspiration/biopsy(s) (includes endoscopic                            ultrasound examination of the esophagus, stomach,                            and either the duodenum or a surgically altered                            stomach where the jejunum is examined distal to the                            anastomosis) Diagnosis Code(s):        --- Professional ---                            Z90.49, Acquired absence of other specified parts                            of digestive tract                           K86.2, Cyst of pancreas CPT copyright 2019 American Medical Association. All rights reserved. The codes documented in this report are preliminary and upon coder review may  be revised to meet current compliance requirements. Arta Silence, MD 08/08/2019 2:21:23 PM This report has been signed electronically. Number of Addenda: 0

## 2019-08-08 NOTE — H&P (Signed)
Patient interval history reviewed.  Patient examined again.  There has been no change from documented H/P (scanned into chart from our office) except as documented below.  Assessment:  1.  Pancreatic cyst.  Plan:  1.  Upper endoscopic ultrasound with possible fine needle aspiration/cyst aspiration. 2.  Risks (bleeding, infection, bowel perforation that could require surgery, sedation-related changes in cardiopulmonary systems), benefits (identification and possible treatment of source of symptoms, exclusion of certain causes of symptoms), and alternatives (watchful waiting, radiographic imaging studies, empiric medical treatment) of upper endoscopy with ultrasound and possible cyst fluid aspiration/fine needle aspiration (EUS +/- FNA) were explained to patient/family in detail and patient wishes to proceed.

## 2019-08-08 NOTE — Anesthesia Postprocedure Evaluation (Signed)
Anesthesia Post Note  Patient: Edward Zavala  Procedure(s) Performed: UPPER ENDOSCOPIC ULTRASOUND (EUS) LINEAR (N/A ) FINE NEEDLE ASPIRATION (FNA) LINEAR (N/A )     Patient location during evaluation: Endoscopy Anesthesia Type: MAC Level of consciousness: awake and alert, oriented and patient cooperative Pain management: pain level controlled Vital Signs Assessment: post-procedure vital signs reviewed and stable Respiratory status: spontaneous breathing, nonlabored ventilation and respiratory function stable Cardiovascular status: blood pressure returned to baseline and stable Postop Assessment: no apparent nausea or vomiting Anesthetic complications: no   No complications documented.  Last Vitals:  Vitals:   08/08/19 1422 08/08/19 1430  BP: (!) 97/52 91/64  Pulse: 85 88  Resp: 18 15  Temp: (!) 36.3 C   SpO2: 96% 93%    Last Pain:  Vitals:   08/08/19 1430  TempSrc:   PainSc: 0-No pain                 Juanluis Guastella,E. [place]

## 2019-08-08 NOTE — Anesthesia Preprocedure Evaluation (Addendum)
Anesthesia Evaluation  Patient identified by MRN, date of birth, ID band Patient awake    Reviewed: Allergy & Precautions, NPO status , Patient's Chart, lab work & pertinent test results  History of Anesthesia Complications Negative for: history of anesthetic complications  Airway Mallampati: I  TM Distance: >3 FB Neck ROM: Full    Dental  (+) Dental Advisory Given   Pulmonary former smoker,  08/04/2019 SARS coronavirus NEG   breath sounds clear to auscultation       Cardiovascular hypertension, Pt. on medications (-) angina Rhythm:Regular Rate:Normal  '19 ECHO: EF 60-65%, mild LVH. Systolic function was normal, EF 60% to 65%, valves OK   Neuro/Psych Anxiety Depression TIACVA    GI/Hepatic Neg liver ROS, GERD  Medicated and Controlled,Abnormal x ray of pancreas   Endo/Other  negative endocrine ROS  Renal/GU negative Renal ROS     Musculoskeletal   Abdominal (+) + obese,   Peds  Hematology negative hematology ROS (+)   Anesthesia Other Findings   Reproductive/Obstetrics                            Anesthesia Physical Anesthesia Plan  ASA: II  Anesthesia Plan: MAC   Post-op Pain Management:    Induction:   PONV Risk Score and Plan: 1 and Treatment may vary due to age or medical condition  Airway Management Planned: Natural Airway and Nasal Cannula  Additional Equipment: None  Intra-op Plan:   Post-operative Plan:   Informed Consent: I have reviewed the patients History and Physical, chart, labs and discussed the procedure including the risks, benefits and alternatives for the proposed anesthesia with the patient or authorized representative who has indicated his/her understanding and acceptance.     Dental advisory given  Plan Discussed with: CRNA and Surgeon  Anesthesia Plan Comments:        Anesthesia Quick Evaluation

## 2019-08-08 NOTE — Anesthesia Procedure Notes (Signed)
Procedure Name: MAC Date/Time: 08/08/2019 1:39 PM Performed by: Niel Hummer, CRNA Pre-anesthesia Checklist: Patient identified, Emergency Drugs available, Suction available and Patient being monitored Oxygen Delivery Method: Simple face mask

## 2019-08-08 NOTE — Discharge Instructions (Signed)

## 2019-08-09 ENCOUNTER — Encounter (HOSPITAL_COMMUNITY): Payer: Self-pay | Admitting: Gastroenterology

## 2019-08-09 LAB — CYTOLOGY - NON PAP

## 2019-08-11 DIAGNOSIS — D485 Neoplasm of uncertain behavior of skin: Secondary | ICD-10-CM | POA: Diagnosis not present

## 2019-08-11 DIAGNOSIS — L570 Actinic keratosis: Secondary | ICD-10-CM | POA: Diagnosis not present

## 2019-08-17 DIAGNOSIS — R3912 Poor urinary stream: Secondary | ICD-10-CM | POA: Diagnosis not present

## 2019-08-17 DIAGNOSIS — R350 Frequency of micturition: Secondary | ICD-10-CM | POA: Diagnosis not present

## 2019-08-17 DIAGNOSIS — R3121 Asymptomatic microscopic hematuria: Secondary | ICD-10-CM | POA: Diagnosis not present

## 2019-09-04 DIAGNOSIS — N401 Enlarged prostate with lower urinary tract symptoms: Secondary | ICD-10-CM | POA: Diagnosis not present

## 2019-09-04 DIAGNOSIS — R350 Frequency of micturition: Secondary | ICD-10-CM | POA: Diagnosis not present

## 2019-09-15 DIAGNOSIS — F909 Attention-deficit hyperactivity disorder, unspecified type: Secondary | ICD-10-CM | POA: Diagnosis not present

## 2019-09-15 DIAGNOSIS — F3341 Major depressive disorder, recurrent, in partial remission: Secondary | ICD-10-CM | POA: Diagnosis not present

## 2019-09-19 DIAGNOSIS — I129 Hypertensive chronic kidney disease with stage 1 through stage 4 chronic kidney disease, or unspecified chronic kidney disease: Secondary | ICD-10-CM | POA: Diagnosis not present

## 2019-09-19 DIAGNOSIS — N183 Chronic kidney disease, stage 3 unspecified: Secondary | ICD-10-CM | POA: Diagnosis not present

## 2019-10-03 DIAGNOSIS — F411 Generalized anxiety disorder: Secondary | ICD-10-CM | POA: Diagnosis not present

## 2019-10-03 DIAGNOSIS — I10 Essential (primary) hypertension: Secondary | ICD-10-CM | POA: Diagnosis not present

## 2019-10-03 DIAGNOSIS — Z87891 Personal history of nicotine dependence: Secondary | ICD-10-CM | POA: Diagnosis not present

## 2019-10-03 DIAGNOSIS — G47419 Narcolepsy without cataplexy: Secondary | ICD-10-CM | POA: Diagnosis not present

## 2019-10-03 DIAGNOSIS — E785 Hyperlipidemia, unspecified: Secondary | ICD-10-CM | POA: Diagnosis not present

## 2019-10-03 DIAGNOSIS — E669 Obesity, unspecified: Secondary | ICD-10-CM | POA: Diagnosis not present

## 2019-10-03 DIAGNOSIS — R32 Unspecified urinary incontinence: Secondary | ICD-10-CM | POA: Diagnosis not present

## 2019-10-03 DIAGNOSIS — F325 Major depressive disorder, single episode, in full remission: Secondary | ICD-10-CM | POA: Diagnosis not present

## 2019-10-03 DIAGNOSIS — K3184 Gastroparesis: Secondary | ICD-10-CM | POA: Diagnosis not present

## 2019-10-03 DIAGNOSIS — Z8673 Personal history of transient ischemic attack (TIA), and cerebral infarction without residual deficits: Secondary | ICD-10-CM | POA: Diagnosis not present

## 2019-10-03 DIAGNOSIS — K219 Gastro-esophageal reflux disease without esophagitis: Secondary | ICD-10-CM | POA: Diagnosis not present

## 2019-10-31 DIAGNOSIS — Z86010 Personal history of colonic polyps: Secondary | ICD-10-CM | POA: Diagnosis not present

## 2019-10-31 DIAGNOSIS — Z23 Encounter for immunization: Secondary | ICD-10-CM | POA: Diagnosis not present

## 2019-10-31 DIAGNOSIS — F324 Major depressive disorder, single episode, in partial remission: Secondary | ICD-10-CM | POA: Diagnosis not present

## 2019-10-31 DIAGNOSIS — Z1389 Encounter for screening for other disorder: Secondary | ICD-10-CM | POA: Diagnosis not present

## 2019-10-31 DIAGNOSIS — Z0000 Encounter for general adult medical examination without abnormal findings: Secondary | ICD-10-CM | POA: Diagnosis not present

## 2019-10-31 DIAGNOSIS — K2100 Gastro-esophageal reflux disease with esophagitis, without bleeding: Secondary | ICD-10-CM | POA: Diagnosis not present

## 2019-10-31 DIAGNOSIS — N183 Chronic kidney disease, stage 3 unspecified: Secondary | ICD-10-CM | POA: Diagnosis not present

## 2019-10-31 DIAGNOSIS — E7800 Pure hypercholesterolemia, unspecified: Secondary | ICD-10-CM | POA: Diagnosis not present

## 2019-10-31 DIAGNOSIS — I129 Hypertensive chronic kidney disease with stage 1 through stage 4 chronic kidney disease, or unspecified chronic kidney disease: Secondary | ICD-10-CM | POA: Diagnosis not present

## 2019-11-09 DIAGNOSIS — N5201 Erectile dysfunction due to arterial insufficiency: Secondary | ICD-10-CM | POA: Diagnosis not present

## 2019-11-09 DIAGNOSIS — R351 Nocturia: Secondary | ICD-10-CM | POA: Diagnosis not present

## 2019-11-09 DIAGNOSIS — N401 Enlarged prostate with lower urinary tract symptoms: Secondary | ICD-10-CM | POA: Diagnosis not present

## 2019-11-14 DIAGNOSIS — L578 Other skin changes due to chronic exposure to nonionizing radiation: Secondary | ICD-10-CM | POA: Diagnosis not present

## 2019-11-14 DIAGNOSIS — Z85828 Personal history of other malignant neoplasm of skin: Secondary | ICD-10-CM | POA: Diagnosis not present

## 2019-11-14 DIAGNOSIS — L814 Other melanin hyperpigmentation: Secondary | ICD-10-CM | POA: Diagnosis not present

## 2019-11-14 DIAGNOSIS — Z86018 Personal history of other benign neoplasm: Secondary | ICD-10-CM | POA: Diagnosis not present

## 2019-11-14 DIAGNOSIS — L821 Other seborrheic keratosis: Secondary | ICD-10-CM | POA: Diagnosis not present

## 2019-11-14 DIAGNOSIS — L570 Actinic keratosis: Secondary | ICD-10-CM | POA: Diagnosis not present

## 2019-11-14 DIAGNOSIS — D225 Melanocytic nevi of trunk: Secondary | ICD-10-CM | POA: Diagnosis not present

## 2019-11-16 DIAGNOSIS — F909 Attention-deficit hyperactivity disorder, unspecified type: Secondary | ICD-10-CM | POA: Diagnosis not present

## 2019-11-16 DIAGNOSIS — F411 Generalized anxiety disorder: Secondary | ICD-10-CM | POA: Diagnosis not present

## 2019-11-16 DIAGNOSIS — F3341 Major depressive disorder, recurrent, in partial remission: Secondary | ICD-10-CM | POA: Diagnosis not present

## 2020-01-03 DIAGNOSIS — F3341 Major depressive disorder, recurrent, in partial remission: Secondary | ICD-10-CM | POA: Diagnosis not present

## 2020-01-03 DIAGNOSIS — F909 Attention-deficit hyperactivity disorder, unspecified type: Secondary | ICD-10-CM | POA: Diagnosis not present

## 2020-02-14 ENCOUNTER — Encounter: Payer: Self-pay | Admitting: Family Medicine

## 2020-02-14 ENCOUNTER — Other Ambulatory Visit: Payer: Self-pay

## 2020-02-14 ENCOUNTER — Ambulatory Visit: Payer: Medicare PPO | Admitting: Family Medicine

## 2020-02-14 VITALS — BP 124/92 | HR 102 | Temp 97.3°F | Resp 18 | Ht 67.0 in | Wt 201.6 lb

## 2020-02-14 DIAGNOSIS — E782 Mixed hyperlipidemia: Secondary | ICD-10-CM | POA: Diagnosis not present

## 2020-02-14 DIAGNOSIS — F339 Major depressive disorder, recurrent, unspecified: Secondary | ICD-10-CM | POA: Diagnosis not present

## 2020-02-14 DIAGNOSIS — Z860100 Personal history of colon polyps, unspecified: Secondary | ICD-10-CM

## 2020-02-14 DIAGNOSIS — G43909 Migraine, unspecified, not intractable, without status migrainosus: Secondary | ICD-10-CM | POA: Insufficient documentation

## 2020-02-14 DIAGNOSIS — M1990 Unspecified osteoarthritis, unspecified site: Secondary | ICD-10-CM | POA: Insufficient documentation

## 2020-02-14 DIAGNOSIS — K3184 Gastroparesis: Secondary | ICD-10-CM | POA: Insufficient documentation

## 2020-02-14 DIAGNOSIS — N400 Enlarged prostate without lower urinary tract symptoms: Secondary | ICD-10-CM | POA: Insufficient documentation

## 2020-02-14 DIAGNOSIS — Z8673 Personal history of transient ischemic attack (TIA), and cerebral infarction without residual deficits: Secondary | ICD-10-CM

## 2020-02-14 DIAGNOSIS — K2100 Gastro-esophageal reflux disease with esophagitis, without bleeding: Secondary | ICD-10-CM

## 2020-02-14 DIAGNOSIS — F39 Unspecified mood [affective] disorder: Secondary | ICD-10-CM | POA: Insufficient documentation

## 2020-02-14 DIAGNOSIS — N1831 Chronic kidney disease, stage 3a: Secondary | ICD-10-CM

## 2020-02-14 DIAGNOSIS — N183 Chronic kidney disease, stage 3 unspecified: Secondary | ICD-10-CM | POA: Insufficient documentation

## 2020-02-14 DIAGNOSIS — I10 Essential (primary) hypertension: Secondary | ICD-10-CM

## 2020-02-14 DIAGNOSIS — G43009 Migraine without aura, not intractable, without status migrainosus: Secondary | ICD-10-CM

## 2020-02-14 DIAGNOSIS — N529 Male erectile dysfunction, unspecified: Secondary | ICD-10-CM | POA: Insufficient documentation

## 2020-02-14 DIAGNOSIS — R932 Abnormal findings on diagnostic imaging of liver and biliary tract: Secondary | ICD-10-CM | POA: Insufficient documentation

## 2020-02-14 DIAGNOSIS — K2270 Barrett's esophagus without dysplasia: Secondary | ICD-10-CM | POA: Insufficient documentation

## 2020-02-14 MED ORDER — HYDROCHLOROTHIAZIDE 12.5 MG PO CAPS: 12.5000 mg | ORAL_CAPSULE | Freq: Every day | ORAL | 3 refills | Status: DC

## 2020-02-14 NOTE — Patient Instructions (Signed)
Please return in 8 weeks for blood pressure recheck. Please sign a release of records for Dr. Jone Baseman records.   Please keep a log of your home blood pressure readings. You may check them periodically at least twice weekly, at different times of the day.  I have ordered low dose HCTZ for you to take in addition to your lisinopril.   It was a pleasure meeting you today! Thank you for choosing [place] to meet your healthcare needs! I truly look forward to working with you. If you have any questions or concerns, please send me a message via Mychart or call the office at [phone].

## 2020-02-14 NOTE — Progress Notes (Signed)
Subjective  CC:  Chief Complaint  Patient presents with  . New Patient (Initial Visit)   74 year old male former patient of Dr. Barbaraann Rondo looking for new PCP.  Hoping to find a better fit.  I reviewed old records but he did get records from [place].  Reviewed new patient paperwork.  Long discussion with patient regarding past medical history and goals of care HPI: Edward Zavala is a 74 y.o. male who presents to [facility] today to establish care with me as a new patient.   He has the following concerns or needs:  He has multiple chronic medical problems..  These include hypertension.  He takes lisinopril for this but reports his diastolics remain in the high 80s to low 90s.  Medications have not been adjusted for several years.  He has no history of heart disease that he is aware of.  He denies shortness of breath or chest pain or palpitations.  Lab review does reveal mild chronic renal insufficiency.  He reports he is never been told this.  He denies lower extremity edema.  He has chronic depression with anxiety currently seeing a psychiatrist nurse practitioner for this.  She is adjusting medications.  He sees gastroenterology for history of colonic polyps, GERD and possible gastroparesis.  Need to review old records.  Colonoscopies are up-to-date.  He has BPH and has a urologist managing this.  Symptoms are currently well controlled.  He does report history of low blood pressure on Flomax.  I reviewed notes from neurology back in 2018 in 2019 because patient had a TIA.  This was due to occlusion of the vertebral artery possibly from embolism versus dissection.  He remains on high-dose aspirin for secondary prevention.  He has hypercholesterolemia and is on a statin.  Goal LDL is less than 70.  He is married and has 2 grown children.  He is a retired Civil engineer, contracting, [employer] information centre manager.  He keeps active.  His last complete physical was in September of this year.  I need  records to review lab work.  Assessment  1. Essential hypertension   2. Mixed hyperlipidemia   3. Personal history of colonic polyps   4. Migraine without aura and without status migrainosus, not intractable   5. Stage 3a chronic kidney disease ([place])   6. History of transient ischemic attack (TIA) vertebral artery: embolism or dissection 2018   7. Gastroesophageal reflux disease with esophagitis without hemorrhage   8. Major depression, recurrent, chronic (HCC)      Plan   Essential hypertension: Marginally controlled.  Diastolics remain elevated.  Add low-dose HCTZ to lisinopril patient will check home blood pressures.  He has mild lower extremity edema and this should help with that.  I will get records to review his lab work.  Recheck in 8 weeks.  We will check blood panel at that time.  Hyperlipidemia with goal LDL less than 70.  Need to get records.  Continue statin.  History of TIA: Continue high-dose aspirin daily.  Reinforced need for good blood pressure control, cholesterol control and healthy diet.  Reports remote history of migraines as a young person.  He gets a rare migraine.  He keeps Fiorinal to use as needed.  Last use was over 3 years ago.  Mild chronic kidney disease.  We will get records.  Monitor and work-up further if progresses.  Likely from longstanding hypertension.   GERD: PPI control symptoms.  Continue follow-up with GI  Major chronic depression managed by psychiatry.  On multiple meds.  Working on getting under better control.  Follow up: 8 weeks for recheck blood pressure and lab work No orders of the defined types were placed in this encounter.  Meds ordered this encounter  Medications  . hydrochlorothiazide (MICROZIDE) 12.5 MG capsule    Sig: Take 1 capsule (12.5 mg total) by mouth daily.    Dispense:  90 capsule    Refill:  3     Depression screen PHQ 2/9 02/14/2020  Decreased Interest 0  Down, Depressed, Hopeless 0  PHQ - 2 Score 0    We  updated and reviewed the patient's past history in detail and it is documented below.  Patient Active Problem List   Diagnosis Date Noted  . Barretts esophagus 02/14/2020    Priority: High  . Chronic kidney disease, stage 3 unspecified ([place]) 02/14/2020    Priority: High  . Gastroparesis 02/14/2020    Priority: High  . Osteoarthritis 02/14/2020    Priority: High  . Mixed hyperlipidemia 02/14/2020    Priority: High  . 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 Essential hypertension 04/25/2017    Priority: High  . Gastroesophageal reflux disease with esophagitis 02/14/2020    Priority: Medium  . Major depression, recurrent, chronic ([place]) 02/14/2020    Priority: Medium  . Migraine 02/14/2020    Priority: Medium    Remote history: keeps Fioricet to be used IF needed. Extreme rare use   . Personal history of colonic polyps 02/14/2020    Priority: Medium  . Erectile dysfunction 02/14/2020    Priority: Low  . BPH without obstruction/lower urinary tract symptoms 02/14/2020    Priority: Low  . Occlusion and stenosis of left vertebral artery 04/25/2017   Health Maintenance  Topic Date Due  . Hepatitis C Screening  Never done  . TETANUS/TDAP  12/23/2024  . COLONOSCOPY (Pts 45-64yrs Insurance coverage will need to be confirmed)  11/23/2027  . INFLUENZA VACCINE  Completed  . COVID-19 Vaccine  Completed  . PNA vac Low Risk Adult  Completed   Immunization History  Administered Date(s) Administered  . Influenza Split 12/02/2011, 10/26/2014, 12/17/2016  . Influenza, High Dose Seasonal PF 01/21/2016, 12/01/2017, 10/31/2019  . Influenza,inj,Quad PF,6+ Mos 10/27/2018  . PFIZER SARS-COV-2 Vaccination 03/13/2019, 04/03/2019, 11/11/2019  . Pneumococcal Conjugate-13 09/27/2013  . Pneumococcal Polysaccharide-23 09/24/2011  . Td 10/18/2003, 12/24/2014  . Zoster 01/19/2006  . Zoster Recombinat (Shingrix)  12/01/2017, 02/01/2018   Current Meds  Medication Sig  . ALPRAZolam (XANAX) 0.25 MG tablet Take 0.25 mg by mouth as needed.  Marland Kitchen atorvastatin (LIPITOR) 20 MG tablet Take 20 mg by mouth daily.   . busPIRone (BUSPAR) 15 MG tablet Take 15 mg by mouth 2 (two) times daily.   . butalbital-aspirin-caffeine (FIORINAL) 50-325-40 MG per capsule Take 1 capsule by mouth every 6 (six) hours as needed for headache. Headache  . DULoxetine (CYMBALTA) 30 MG capsule Take 30 mg by mouth daily.   . hydrochlorothiazide (MICROZIDE) 12.5 MG capsule Take 1 capsule (12.5 mg total) by mouth daily.  . hydroxypropyl methylcellulose (ISOPTO TEARS) 2.5 % ophthalmic solution Place 1 drop into both eyes as needed. Dry eyes  . lisinopril (PRINIVIL,ZESTRIL) 40 MG tablet Take 40 mg by mouth daily.   . modafinil (PROVIGIL) 100 MG tablet Take 100 mg by mouth daily.  Marland Kitchen omeprazole (PRILOSEC) 20 MG capsule Take 20 mg by mouth daily.  Marland Kitchen  ondansetron (ZOFRAN) 4 MG tablet Take 4 mg by mouth every 8 (eight) hours as needed for nausea or vomiting.  . Sildenafil Citrate (VIAGRA PO) Take 100 mg by mouth.    Allergies: Patient has No Known Allergies. Past Medical History Patient  has a past medical history of Anxiety, Depression, Dyslipidemia (high LDL; low HDL), Gastroparesis, GERD (gastroesophageal reflux disease), H/O acute pancreatitis, H/O gastroesophageal reflux (GERD), Headache(784.0), Hyperlipidemia, Hypertension, and Stroke (HCC). Past Surgical History Patient  has a past surgical history that includes Cholecystectomy; Laparoscopic Nissen fundoplication; Hernia repair; Eye surgery; Ventral hernia repair (12/01/2011); EUS (N/A, 08/08/2019); Fine needle aspiration (N/A, 08/08/2019); and Esophagogastroduodenoscopy (egd) with propofol (N/A, 08/08/2019). Family History: Patient family history includes Alcohol abuse in his father; Asthma in his son; Cancer (age of onset: 28) in his mother; Depression in his daughter, mother, and son; Early  death in his maternal grandmother; Heart attack in his maternal grandmother; High blood pressure in his daughter and mother. Social History:  Patient  reports that he quit smoking about 43 years ago. His smoking use included cigarettes. He has never used smokeless tobacco. He reports current alcohol use of about 10.0 - 14.0 standard drinks of alcohol per week. He reports that he does not use drugs.  Review of Systems: Constitutional: negative for fever or malaise Ophthalmic: negative for photophobia, double vision or loss of vision Cardiovascular: negative for chest pain, dyspnea on exertion, or new LE swelling Respiratory: negative for SOB or persistent cough Gastrointestinal: negative for abdominal pain, change in bowel habits or melena Genitourinary: negative for dysuria or gross hematuria Musculoskeletal: negative for new gait disturbance or muscular weakness Integumentary: negative for new or persistent rashes Neurological: negative for TIA or stroke symptoms Psychiatric: negative for SI or delusions Allergic/Immunologic: negative for hives  Patient Care Team    Relationship Specialty Notifications Start End  Willow Ora, MD PCP - General Family Medicine  02/14/20   Micki Riley, MD Consulting Physician Neurology  02/14/20   Vida Rigger, MD Consulting Physician Gastroenterology  02/14/20   Antony Contras, MD Consulting Physician Ophthalmology  02/14/20   Jacqlyn Krauss, MD Referring Physician Dermatology  02/14/20   Crista Elliot, MD Consulting Physician Urology  02/14/20     Objective  Vitals: BP (!) 124/92   Pulse (!) 102   Temp (!) 97.3 F (36.3 C) (Temporal)   Resp 18   Ht 5\' 7"  (1.702 m)   Wt 201 lb 9.6 oz (91.4 kg)   SpO2 95%   BMI 31.58 kg/m  General:  Well developed, well nourished, no acute distress  Psych:  Alert and oriented,normal mood and affect HEENT:  Normocephalic, atraumatic, non-icteric sclera, supple neck without adenopathy, mass or  thyromegaly Cardiovascular:  RRR without gallop, rub or murmur, trace pitting edema bilateral to mid shins Respiratory:  Good breath sounds bilaterally, CTAB with normal respiratory effort Neurologic:    Mental status is normal.  Normal gait   Commons side effects, risks, benefits, and alternatives for medications and treatment plan prescribed today were discussed, and the patient expressed understanding of the given instructions. Patient is instructed to call or message via MyChart if he/she has any questions or concerns regarding our treatment plan. No barriers to understanding were identified. We discussed Red Flag symptoms and signs in detail. Patient expressed understanding regarding what to do in case of urgent or emergency type symptoms.   Medication list was reconciled, printed and provided to the patient in AVS. Patient instructions and  summary information was reviewed with the patient as documented in the AVS. This note was prepared with assistance of Dragon voice recognition software. Occasional wrong-word or sound-a-like substitutions may have occurred due to the inherent limitations of voice recognition software  This visit occurred during the SARS-CoV-2 public health emergency.  Safety protocols were in place, including screening questions prior to the visit, additional usage of staff PPE, and extensive cleaning of exam room while observing appropriate contact time as indicated for disinfecting solutions.

## 2020-02-27 ENCOUNTER — Other Ambulatory Visit: Payer: Self-pay

## 2020-02-27 ENCOUNTER — Other Ambulatory Visit: Payer: Medicare PPO

## 2020-02-27 DIAGNOSIS — Z20822 Contact with and (suspected) exposure to covid-19: Secondary | ICD-10-CM | POA: Diagnosis not present

## 2020-02-27 DIAGNOSIS — F3341 Major depressive disorder, recurrent, in partial remission: Secondary | ICD-10-CM | POA: Diagnosis not present

## 2020-02-27 DIAGNOSIS — F411 Generalized anxiety disorder: Secondary | ICD-10-CM | POA: Diagnosis not present

## 2020-02-27 DIAGNOSIS — F909 Attention-deficit hyperactivity disorder, unspecified type: Secondary | ICD-10-CM | POA: Diagnosis not present

## 2020-02-29 LAB — NOVEL CORONAVIRUS, NAA: SARS-CoV-2, NAA: NOT DETECTED

## 2020-02-29 LAB — SARS-COV-2, NAA 2 DAY TAT

## 2020-03-21 DIAGNOSIS — L988 Other specified disorders of the skin and subcutaneous tissue: Secondary | ICD-10-CM | POA: Diagnosis not present

## 2020-03-21 DIAGNOSIS — L570 Actinic keratosis: Secondary | ICD-10-CM | POA: Diagnosis not present

## 2020-03-21 DIAGNOSIS — D485 Neoplasm of uncertain behavior of skin: Secondary | ICD-10-CM | POA: Diagnosis not present

## 2020-04-10 ENCOUNTER — Encounter: Payer: Self-pay | Admitting: Family Medicine

## 2020-04-10 ENCOUNTER — Ambulatory Visit: Payer: Medicare PPO | Admitting: Family Medicine

## 2020-04-10 ENCOUNTER — Other Ambulatory Visit: Payer: Self-pay

## 2020-04-10 VITALS — BP 124/98 | HR 75 | Temp 97.3°F | Resp 16 | Wt 195.3 lb

## 2020-04-10 DIAGNOSIS — E782 Mixed hyperlipidemia: Secondary | ICD-10-CM | POA: Diagnosis not present

## 2020-04-10 DIAGNOSIS — I10 Essential (primary) hypertension: Secondary | ICD-10-CM | POA: Diagnosis not present

## 2020-04-10 DIAGNOSIS — Z8673 Personal history of transient ischemic attack (TIA), and cerebral infarction without residual deficits: Secondary | ICD-10-CM

## 2020-04-10 DIAGNOSIS — N529 Male erectile dysfunction, unspecified: Secondary | ICD-10-CM

## 2020-04-10 DIAGNOSIS — F339 Major depressive disorder, recurrent, unspecified: Secondary | ICD-10-CM

## 2020-04-10 DIAGNOSIS — N1831 Chronic kidney disease, stage 3a: Secondary | ICD-10-CM

## 2020-04-10 LAB — COMPREHENSIVE METABOLIC PANEL
ALT: 16 U/L (ref 0–53)
AST: 18 U/L (ref 0–37)
Albumin: 4 g/dL (ref 3.5–5.2)
Alkaline Phosphatase: 104 U/L (ref 39–117)
BUN: 14 mg/dL (ref 6–23)
CO2: 30 mEq/L (ref 19–32)
Calcium: 9.6 mg/dL (ref 8.4–10.5)
Chloride: 99 mEq/L (ref 96–112)
Creatinine, Ser: 1.14 mg/dL (ref 0.40–1.50)
GFR: 63.1 mL/min (ref >60.00)
Glucose, Bld: 81 mg/dL (ref 70–99)
Potassium: 4.1 mEq/L (ref 3.5–5.1)
Sodium: 135 mEq/L (ref 135–145)
Total Bilirubin: 0.5 mg/dL (ref 0.2–1.2)
Total Protein: 6.8 g/dL (ref 6.0–8.3)

## 2020-04-10 LAB — CBC WITH DIFFERENTIAL/PLATELET
Basophils Absolute: 0 10*3/uL (ref 0.0–0.1)
Basophils Relative: 0.5 % (ref 0.0–3.0)
Eosinophils Absolute: 0.1 10*3/uL (ref 0.0–0.7)
Eosinophils Relative: 2.5 % (ref 0.0–5.0)
HCT: 45 % (ref 39.0–52.0)
Hemoglobin: 15.2 g/dL (ref 13.0–17.0)
Lymphocytes Relative: 29.2 % (ref 12.0–46.0)
Lymphs Abs: 1.3 10*3/uL (ref 0.7–4.0)
MCHC: 33.8 g/dL (ref 30.0–36.0)
MCV: 94.5 fl (ref 78.0–100.0)
Monocytes Absolute: 0.5 10*3/uL (ref 0.1–1.0)
Monocytes Relative: 10.4 % (ref 3.0–12.0)
Neutro Abs: 2.6 10*3/uL (ref 1.4–7.7)
Neutrophils Relative %: 57.4 % (ref 43.0–77.0)
Platelets: 285 10*3/uL (ref 150.0–400.0)
RBC: 4.77 Mil/uL (ref 4.22–5.81)
RDW: 13.6 % (ref 11.5–15.5)
WBC: 4.5 10*3/uL (ref 4.0–10.5)

## 2020-04-10 LAB — LIPID PANEL
Cholesterol: 134 mg/dL (ref 0–200)
HDL: 44.7 mg/dL (ref >39.00)
LDL Cholesterol: 64 mg/dL (ref 0–99)
NonHDL: 89.01
Total CHOL/HDL Ratio: 3
Triglycerides: 123 mg/dL (ref 0.0–149.0)
VLDL: 24.6 mg/dL (ref 0.0–40.0)

## 2020-04-10 LAB — TSH: TSH: 1.93 u[IU]/mL (ref 0.35–4.50)

## 2020-04-10 MED ORDER — METOPROLOL SUCCINATE ER 50 MG PO TB24: 50.0000 mg | ORAL_TABLET | Freq: Every day | ORAL | 3 refills | Status: DC

## 2020-04-10 MED ORDER — LISINOPRIL-HYDROCHLOROTHIAZIDE 20-12.5 MG PO TABS: 1.0000 | ORAL_TABLET | Freq: Every day | ORAL | 3 refills | Status: DC

## 2020-04-10 NOTE — Progress Notes (Signed)
Subjective  CC:  Chief Complaint  Patient presents with  . Hypertension    8 week f/u, add low dose hctz in addition to lisinopril at last visit, 130/90-100's at home BP readings     HPI: Edward Zavala is a 75 y.o. male who presents to the office today to address the problems listed above in the chief complaint.  Hypertension f/u: We added 12.5 mg of HCTZ to 20 mg of lisinopril at last visit.  Control is poor. Pt reports he is doing well. taking medications as instructed, no medication side effects noted, no TIAs, no chest pain on exertion, no dyspnea on exertion, no swelling of ankles.  However, blood pressures remain elevated. denies adverse effects from his BP medications. Compliance with medication is good.   Mixed hyperlipidemia: He takes atorvastatin 20 mg nightly.  Tolerates this well.  Goal LDL is less than 70.  Not fasting today.  Mild kidney disease: Likely from chronic hypertension.  Trace lower extremity edema but he does not notice this.  History of TIA: Possibly due to embolism.  On high-dose aspirin.  No neurologic symptoms or headaches.  Chronic depression on Cymbalta: He continues to report his mood is good.  Complains of erectile dysfunction.  He does have a urologist.  He has benign prostate hypertrophy as well.  He is on Viagra but does not feel that this is helpful.  He is inquiring about testosterone levels.  Assessment  1. Essential hypertension   2. Mixed hyperlipidemia   3. Stage 3a chronic kidney disease ([place])   4. History of transient ischemic attack (TIA) vertebral artery: embolism or dissection 2018   5. Major depression, recurrent, chronic (HCC)   6. Vasculogenic erectile dysfunction, unspecified vasculogenic erectile dysfunction type      Plan    Hypertension f/u: BP control is poorly controlled.  We will combined medications to Zestoretic 20/12.5 daily and and Toprol 50 mg daily.  Recheck 4 to 6 weeks.  He will continue home monitoring.  Recommend  bringing his cuff.  Hyperlipidemia f/u: Recheck nonfasting lipid panel and LFTs today.  On atorvastatin 20 mg nightly.  Adjust dose if LDL not at goal.  History of TIA: Continue secondary prevention with high-dose aspirin.  Need to get blood pressure under better control.  Chronic major depression is well controlled on Cymbalta 60 mg.  Discussed erectile dysfunction unresponsive to Viagra.  Recommend follow-up with his urologist.  Patient today complaining Education regarding management of these chronic disease states was given. Management strategies discussed on successive visits include dietary and exercise recommendations, goals of achieving and maintaining IBW, and lifestyle modifications aiming for adequate sleep and minimizing stressors.   Follow up: 4 to 6 weeks to recheck blood pressure  Orders Placed This Encounter  Procedures  . CBC with Differential/Platelet  . Comprehensive metabolic panel  . Lipid panel  . TSH   Meds ordered this encounter  Medications  . lisinopril-hydrochlorothiazide (ZESTORETIC) 20-12.5 MG tablet    Sig: Take 1 tablet by mouth daily.    Dispense:  90 tablet    Refill:  3  . metoprolol succinate (TOPROL-XL) 50 MG 24 hr tablet    Sig: Take 1 tablet (50 mg total) by mouth daily. Take with or immediately following a meal.    Dispense:  90 tablet    Refill:  3      BP Readings from Last 3 Encounters:  04/10/20 (!) 124/98  02/14/20 (!) 124/92  08/08/19 91/64  Wt Readings from Last 3 Encounters:  04/10/20 195 lb 4.8 oz (88.6 kg)  02/14/20 201 lb 9.6 oz (91.4 kg)  08/08/19 200 lb (90.7 kg)    Lab Results  Component Value Date   CHOL 106 04/25/2017   CHOL  02/18/2009    123        ATP III CLASSIFICATION:  <200     mg/dL   Desirable  200-239  mg/dL   Borderline High  >=240    mg/dL   High          Lab Results  Component Value Date   HDL 39 (L) 04/25/2017   HDL 50 02/18/2009   Lab Results  Component Value Date   LDLCALC 52  04/25/2017   [place]  02/18/2009    61        Total Cholesterol/HDL:CHD Risk Coronary Heart Disease Risk Table                     Men   Women  1/2 Average Risk   3.4   3.3  Average Risk       5.0   4.4  2 X Average Risk   9.6   7.1  3 X Average Risk  23.4   11.0        Use the calculated Patient Ratio above and the CHD Risk Table to determine the patient's CHD Risk.        ATP III CLASSIFICATION (LDL):  <100     mg/dL   Optimal  100-129  mg/dL   Near or Above                    Optimal  130-159  mg/dL   Borderline  160-189  mg/dL   High  >190     mg/dL   Very High   Lab Results  Component Value Date   TRIG 77 04/25/2017   TRIG 61 02/18/2009   Lab Results  Component Value Date   CHOLHDL 2.7 04/25/2017   CHOLHDL 2.5 02/18/2009   No results found for: LDLDIRECT Lab Results  Component Value Date   CREATININE 1.21 04/24/2017   BUN 13 04/24/2017   NA 141 04/24/2017   K 3.8 04/24/2017   CL 107 04/24/2017   CO2 24 04/24/2017    The ASCVD Risk score (Goff DC Jr., et al., 2013) failed to calculate for the following reasons:   The valid total cholesterol range is 130 to 320 mg/dL  I reviewed the patients updated PMH, FH, and SocHx.    Patient Active Problem List   Diagnosis Date Noted  . Barretts esophagus 02/14/2020    Priority: High  . Chronic kidney disease, stage 3 unspecified ([place]) 02/14/2020    Priority: High  . Gastroparesis 02/14/2020    Priority: High  . Osteoarthritis 02/14/2020    Priority: High  . Mixed hyperlipidemia 02/14/2020    Priority: High  . 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 Essential hypertension 04/25/2017    Priority: High  . Gastroesophageal reflux disease with esophagitis 02/14/2020    Priority: Medium  . Major depression, recurrent, chronic ([place]) 02/14/2020    Priority: Medium  . Migraine 02/14/2020    Priority: Medium  . Personal history of  colonic polyps 02/14/2020    Priority: Medium  . Erectile dysfunction 02/14/2020    Priority: Low  . BPH without obstruction/lower urinary  tract symptoms 02/14/2020    Priority: Low  . Occlusion and stenosis of left vertebral artery 04/25/2017    Allergies: Patient has no known allergies.  Social History: Patient  reports that he quit smoking about 43 years ago. His smoking use included cigarettes. He has never used smokeless tobacco. He reports current alcohol use of about 10.0 - 14.0 standard drinks of alcohol per week. He reports that he does not use drugs.  Current Meds  Medication Sig  . ALPRAZolam (XANAX) 0.25 MG tablet Take 0.25 mg by mouth as needed.  . Armodafinil 250 MG tablet Take 250 mg by mouth daily.  Marland Kitchen atorvastatin (LIPITOR) 20 MG tablet Take 20 mg by mouth daily.   . busPIRone (BUSPAR) 15 MG tablet Take 15 mg by mouth 2 (two) times daily.   . butalbital-aspirin-caffeine (FIORINAL) 50-325-40 MG per capsule Take 1 capsule by mouth every 6 (six) hours as needed for headache. Headache  . DULoxetine (CYMBALTA) 30 MG capsule Take 60 mg by mouth daily.  . hydroxypropyl methylcellulose (ISOPTO TEARS) 2.5 % ophthalmic solution Place 1 drop into both eyes as needed. Dry eyes  . lisinopril-hydrochlorothiazide (ZESTORETIC) 20-12.5 MG tablet Take 1 tablet by mouth daily.  . metoprolol succinate (TOPROL-XL) 50 MG 24 hr tablet Take 1 tablet (50 mg total) by mouth daily. Take with or immediately following a meal.  . omeprazole (PRILOSEC) 20 MG capsule Take 20 mg by mouth daily.  . ondansetron (ZOFRAN) 4 MG tablet Take 4 mg by mouth every 8 (eight) hours as needed for nausea or vomiting.  . Sildenafil Citrate (VIAGRA PO) Take 100 mg by mouth.  . [DISCONTINUED] hydrochlorothiazide (MICROZIDE) 12.5 MG capsule Take 1 capsule (12.5 mg total) by mouth daily.  . [DISCONTINUED] lisinopril (PRINIVIL,ZESTRIL) 40 MG tablet Take 20 mg by mouth daily.    Review of Systems: Cardiovascular:  negative for chest pain, palpitations, leg swelling, orthopnea Respiratory: negative for SOB, wheezing or persistent cough Gastrointestinal: negative for abdominal pain Genitourinary: negative for dysuria or gross hematuria  Objective  Vitals: BP (!) 124/98   Pulse 75   Temp (!) 97.3 F (36.3 C) (Temporal)   Resp 16   Wt 195 lb 4.8 oz (88.6 kg)   SpO2 97%   BMI 30.59 kg/m  General: no acute distress  Psych:  Alert and oriented, normal mood and affect HEENT:  Normocephalic, atraumatic, supple neck  Cardiovascular:  RRR without murmur. Tr bil edema Respiratory:  Good breath sounds bilaterally, CTAB with normal respiratory effort  Commons side effects, risks, benefits, and alternatives for medications and treatment plan prescribed today were discussed, and the patient expressed understanding of the given instructions. Patient is instructed to call or message via MyChart if he/she has any questions or concerns regarding our treatment plan. No barriers to understanding were identified. We discussed Red Flag symptoms and signs in detail. Patient expressed understanding regarding what to do in case of urgent or emergency type symptoms.   Medication list was reconciled, printed and provided to the patient in AVS. Patient instructions and summary information was reviewed with the patient as documented in the AVS. This note was prepared with assistance of Dragon voice recognition software. Occasional wrong-word or sound-a-like substitutions may have occurred due to the inherent limitations of voice recognition software  This visit occurred during the SARS-CoV-2 public health emergency.  Safety protocols were in place, including screening questions prior to the visit, additional usage of staff PPE, and extensive cleaning of exam room while observing appropriate contact  time as indicated for disinfecting solutions.

## 2020-04-10 NOTE — Patient Instructions (Signed)
Please return in 4-6 weeks to recheck your blood pressure.   I will release your lab results to you on your MyChart account with further instructions. Please reply with any questions.   We did have you on the lisionopril 20 and hctz 12.5: I'd like to combine these two pills into one. So please stop taking the individual pills and pick up the new combination pill from your pharmacy. It is the 20/12.5 dose to be taken daily. In addition, I have ordered a new blood pressure pill called Toprol XL 50mg  to be taken along with your other BP pill daily.   If you have any questions or concerns, please don't hesitate to send me a message via MyChart or call the office at [phone]. Thank you for visiting with Edward Zavala today! It's our pleasure caring for you.

## 2020-04-24 DIAGNOSIS — F909 Attention-deficit hyperactivity disorder, unspecified type: Secondary | ICD-10-CM | POA: Diagnosis not present

## 2020-04-24 DIAGNOSIS — F411 Generalized anxiety disorder: Secondary | ICD-10-CM | POA: Diagnosis not present

## 2020-04-24 DIAGNOSIS — F3341 Major depressive disorder, recurrent, in partial remission: Secondary | ICD-10-CM | POA: Diagnosis not present

## 2020-04-29 DIAGNOSIS — R932 Abnormal findings on diagnostic imaging of liver and biliary tract: Secondary | ICD-10-CM | POA: Diagnosis not present

## 2020-04-29 DIAGNOSIS — K2270 Barrett's esophagus without dysplasia: Secondary | ICD-10-CM | POA: Diagnosis not present

## 2020-04-30 DIAGNOSIS — C44629 Squamous cell carcinoma of skin of left upper limb, including shoulder: Secondary | ICD-10-CM | POA: Diagnosis not present

## 2020-04-30 DIAGNOSIS — L570 Actinic keratosis: Secondary | ICD-10-CM | POA: Diagnosis not present

## 2020-04-30 DIAGNOSIS — D485 Neoplasm of uncertain behavior of skin: Secondary | ICD-10-CM | POA: Diagnosis not present

## 2020-05-22 ENCOUNTER — Other Ambulatory Visit: Payer: Self-pay

## 2020-05-22 ENCOUNTER — Ambulatory Visit: Payer: Medicare PPO | Admitting: Family Medicine

## 2020-05-22 ENCOUNTER — Encounter: Payer: Self-pay | Admitting: Family Medicine

## 2020-05-22 VITALS — BP 130/88 | HR 67 | Temp 98.0°F | Wt 196.2 lb

## 2020-05-22 DIAGNOSIS — I10 Essential (primary) hypertension: Secondary | ICD-10-CM | POA: Diagnosis not present

## 2020-05-22 NOTE — Progress Notes (Signed)
Subjective  CC:  Chief Complaint  Patient presents with  . Hypertension    "feeling pretty good" since med change at last visit. Denies checking BP at home     HPI: Edward Zavala is a 75 y.o. male who presents to the office today to address the problems listed above in the chief complaint.  Hypertension f/u: Control is improved. Pt reports he is doing well. Now on zestoretic hct 20/12.5 and toprol xl 50 daily. taking medications as instructed, no medication side effects noted, no TIAs, no chest pain on exertion, no dyspnea on exertion, no swelling of ankles. Perhaps mild sluggishness. No sob.  He denies adverse effects from his BP medications. Home Bps 130s/80-85 consistently. Compliance with medication is good. Working on weight loss with [employer]. Nocturia once or twice nightly with hctz.  Assessment  1. Essential hypertension      Plan    Hypertension f/u: BP control is fairly well controlled. Goal bp 120s/70s. Will monitor with weight loss for 3 months. Recheck then and adjust up dosages of meds at that time if needed.   Education regarding management of these chronic disease states was given. Management strategies discussed on successive visits include dietary and exercise recommendations, goals of achieving and maintaining IBW, and lifestyle modifications aiming for adequate sleep and minimizing stressors.   Follow up: 3 mo for recheck BP  No orders of the defined types were placed in this encounter.  No orders of the defined types were placed in this encounter.     BP Readings from Last 3 Encounters:  05/22/20 130/88  04/10/20 (!) 124/98  02/14/20 (!) 124/92   Wt Readings from Last 3 Encounters:  05/22/20 196 lb 3.2 oz (89 kg)  04/10/20 195 lb 4.8 oz (88.6 kg)  02/14/20 201 lb 9.6 oz (91.4 kg)    Lab Results  Component Value Date   CHOL 134 04/10/2020   CHOL 106 04/25/2017   CHOL  02/18/2009    123        ATP III CLASSIFICATION:  <200     mg/dL   Desirable   200-239  mg/dL   Borderline High  >=240    mg/dL   High          Lab Results  Component Value Date   HDL 44.70 04/10/2020   HDL 39 (L) 04/25/2017   HDL 50 02/18/2009   Lab Results  Component Value Date   LDLCALC 64 04/10/2020   LDLCALC 52 04/25/2017   LDLCALC  02/18/2009    61        Total Cholesterol/HDL:CHD Risk Coronary Heart Disease Risk Table                     Men   Women  1/2 Average Risk   3.4   3.3  Average Risk       5.0   4.4  2 X Average Risk   9.6   7.1  3 X Average Risk  23.4   11.0        Use the calculated Patient Ratio above and the CHD Risk Table to determine the patient's CHD Risk.        ATP III CLASSIFICATION (LDL):  <100     mg/dL   Optimal  100-129  mg/dL   Near or Above                    Optimal  130-159  mg/dL  Borderline  160-189  mg/dL   High  >190     mg/dL   Very High   Lab Results  Component Value Date   TRIG 123.0 04/10/2020   TRIG 77 04/25/2017   TRIG 61 02/18/2009   Lab Results  Component Value Date   CHOLHDL 3 04/10/2020   CHOLHDL 2.7 04/25/2017   CHOLHDL 2.5 02/18/2009   No results found for: LDLDIRECT Lab Results  Component Value Date   CREATININE 1.14 04/10/2020   BUN 14 04/10/2020   NA 135 04/10/2020   K 4.1 04/10/2020   CL 99 04/10/2020   CO2 30 04/10/2020    The 10-year ASCVD risk score Mikey Bussing DC Jr., et al., 2013) is: 26.7%   Values used to calculate the score:     Age: 41 years     Sex: Male     Is Non-Hispanic African American: No     Diabetic: No     Tobacco smoker: No     Systolic Blood Pressure: 623 mmHg     Is BP treated: Yes     HDL Cholesterol: 44.7 mg/dL     Total Cholesterol: 134 mg/dL  I reviewed the patients updated PMH, FH, and SocHx.    Patient Active Problem List   Diagnosis Date Noted  . Barretts esophagus 02/14/2020    Priority: High  . Chronic kidney disease, stage 3 unspecified ([place]) 02/14/2020    Priority: High  . Gastroparesis 02/14/2020    Priority: High  . Osteoarthritis  02/14/2020    Priority: High  . Mixed hyperlipidemia 02/14/2020    Priority: High  . 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 Essential hypertension 04/25/2017    Priority: High  . Gastroesophageal reflux disease with esophagitis 02/14/2020    Priority: Medium  . Major depression, recurrent, chronic ([place]) 02/14/2020    Priority: Medium  . Migraine 02/14/2020    Priority: Medium  . Personal history of colonic polyps 02/14/2020    Priority: Medium  . Erectile dysfunction 02/14/2020    Priority: Low  . BPH without obstruction/lower urinary tract symptoms 02/14/2020    Priority: Low  . Occlusion and stenosis of left vertebral artery 04/25/2017    Allergies: Patient has no known allergies.  Social History: Patient  reports that he quit smoking about 43 years ago. His smoking use included cigarettes. He has never used smokeless tobacco. He reports current alcohol use of about 10.0 - 14.0 standard drinks of alcohol per week. He reports that he does not use drugs.  Current Meds  Medication Sig  . ALPRAZolam (XANAX) 0.25 MG tablet Take 0.25 mg by mouth as needed.  . Armodafinil 250 MG tablet Take 250 mg by mouth daily.  Marland Kitchen atorvastatin (LIPITOR) 20 MG tablet Take 20 mg by mouth daily.   . busPIRone (BUSPAR) 15 MG tablet Take 15 mg by mouth 2 (two) times daily.   . butalbital-aspirin-caffeine (FIORINAL) 50-325-40 MG per capsule Take 1 capsule by mouth every 6 (six) hours as needed for headache. Headache  . DULoxetine (CYMBALTA) 30 MG capsule Take 60 mg by mouth daily.  . hydroxypropyl methylcellulose (ISOPTO TEARS) 2.5 % ophthalmic solution Place 1 drop into both eyes as needed. Dry eyes  . lisinopril-hydrochlorothiazide (ZESTORETIC) 20-12.5 MG tablet Take 1 tablet by mouth daily.  . metoprolol succinate (TOPROL-XL) 50 MG 24 hr tablet Take 1 tablet (50 mg total) by mouth daily. Take with or immediately  following a meal.  . omeprazole (PRILOSEC) 20 MG capsule Take 20 mg by mouth daily.  . ondansetron (ZOFRAN) 4 MG tablet Take 4 mg by mouth every 8 (eight) hours as needed for nausea or vomiting.  . Sildenafil Citrate (VIAGRA PO) Take 100 mg by mouth.    Review of Systems: Cardiovascular: negative for chest pain, palpitations, leg swelling, orthopnea Respiratory: negative for SOB, wheezing or persistent cough Gastrointestinal: negative for abdominal pain Genitourinary: negative for dysuria or gross hematuria  Objective  Vitals: BP 130/88   Pulse 67   Temp 98 F (36.7 C) (Temporal)   Wt 196 lb 3.2 oz (89 kg)   SpO2 96%   BMI 30.73 kg/m  General: no acute distress  Psych:  Alert and oriented, normal mood and affect HEENT:  Normocephalic, atraumatic, supple neck  Cardiovascular:  RRR without murmur. no edema Respiratory:  Good breath sounds bilaterally, CTAB with normal respiratory effort   Commons side effects, risks, benefits, and alternatives for medications and treatment plan prescribed today were discussed, and the patient expressed understanding of the given instructions. Patient is instructed to call or message via MyChart if he/she has any questions or concerns regarding our treatment plan. No barriers to understanding were identified. We discussed Red Flag symptoms and signs in detail. Patient expressed understanding regarding what to do in case of urgent or emergency type symptoms.   Medication list was reconciled, printed and provided to the patient in AVS. Patient instructions and summary information was reviewed with the patient as documented in the AVS. This note was prepared with assistance of Dragon voice recognition software. Occasional wrong-word or sound-a-like substitutions may have occurred due to the inherent limitations of voice recognition software  This visit occurred during the SARS-CoV-2 public health emergency.  Safety protocols were in place, including  screening questions prior to the visit, additional usage of staff PPE, and extensive cleaning of exam room while observing appropriate contact time as indicated for disinfecting solutions.

## 2020-05-22 NOTE — Patient Instructions (Signed)
Please return in 3 months to recheck blood pressures.   If you have any questions or concerns, please don't hesitate to send me a message via MyChart or call the office at [phone]. Thank you for visiting with [place] today! It's our pleasure caring for you.

## 2020-05-23 DIAGNOSIS — L814 Other melanin hyperpigmentation: Secondary | ICD-10-CM | POA: Diagnosis not present

## 2020-05-23 DIAGNOSIS — L570 Actinic keratosis: Secondary | ICD-10-CM | POA: Diagnosis not present

## 2020-05-23 DIAGNOSIS — L821 Other seborrheic keratosis: Secondary | ICD-10-CM | POA: Diagnosis not present

## 2020-06-19 DIAGNOSIS — Z9889 Other specified postprocedural states: Secondary | ICD-10-CM | POA: Diagnosis not present

## 2020-06-19 DIAGNOSIS — K2270 Barrett's esophagus without dysplasia: Secondary | ICD-10-CM | POA: Diagnosis not present

## 2020-06-19 DIAGNOSIS — K317 Polyp of stomach and duodenum: Secondary | ICD-10-CM | POA: Diagnosis not present

## 2020-06-21 DIAGNOSIS — K2270 Barrett's esophagus without dysplasia: Secondary | ICD-10-CM | POA: Diagnosis not present

## 2020-07-02 ENCOUNTER — Telehealth: Payer: Self-pay

## 2020-07-02 NOTE — Telephone Encounter (Signed)
Pt called requesting for CMA to give him a call. Pt is confused as to what medications he should be taking due to the pharmacy filling a prescription that he thought he was no longer supposed to take. Please advise.

## 2020-07-02 NOTE — Telephone Encounter (Signed)
Spoke to pt needed to clarify his blood pressure medications. Pt asked if he is suppose to be taking Lisinopril - HCTZ? Told him yes and Metoprolol 50 mg daily. Pt said yes, I also have a Rx for Lisinopril. Told pt you should be taking the combination pill not Lisinopril by itself. Pt verbalized understanding.

## 2020-07-03 DIAGNOSIS — C44629 Squamous cell carcinoma of skin of left upper limb, including shoulder: Secondary | ICD-10-CM | POA: Diagnosis not present

## 2020-07-24 DIAGNOSIS — F4323 Adjustment disorder with mixed anxiety and depressed mood: Secondary | ICD-10-CM | POA: Diagnosis not present

## 2020-07-24 DIAGNOSIS — L570 Actinic keratosis: Secondary | ICD-10-CM | POA: Diagnosis not present

## 2020-07-24 DIAGNOSIS — Z85828 Personal history of other malignant neoplasm of skin: Secondary | ICD-10-CM | POA: Diagnosis not present

## 2020-07-24 DIAGNOSIS — D225 Melanocytic nevi of trunk: Secondary | ICD-10-CM | POA: Diagnosis not present

## 2020-07-24 DIAGNOSIS — R21 Rash and other nonspecific skin eruption: Secondary | ICD-10-CM | POA: Diagnosis not present

## 2020-07-24 DIAGNOSIS — D1801 Hemangioma of skin and subcutaneous tissue: Secondary | ICD-10-CM | POA: Diagnosis not present

## 2020-07-24 DIAGNOSIS — F909 Attention-deficit hyperactivity disorder, unspecified type: Secondary | ICD-10-CM | POA: Diagnosis not present

## 2020-07-24 DIAGNOSIS — L814 Other melanin hyperpigmentation: Secondary | ICD-10-CM | POA: Diagnosis not present

## 2020-07-24 DIAGNOSIS — L821 Other seborrheic keratosis: Secondary | ICD-10-CM | POA: Diagnosis not present

## 2020-07-24 DIAGNOSIS — L853 Xerosis cutis: Secondary | ICD-10-CM | POA: Diagnosis not present

## 2020-08-21 DIAGNOSIS — F411 Generalized anxiety disorder: Secondary | ICD-10-CM | POA: Diagnosis not present

## 2020-08-21 DIAGNOSIS — F4323 Adjustment disorder with mixed anxiety and depressed mood: Secondary | ICD-10-CM | POA: Diagnosis not present

## 2020-08-21 DIAGNOSIS — F331 Major depressive disorder, recurrent, moderate: Secondary | ICD-10-CM | POA: Diagnosis not present

## 2020-08-21 DIAGNOSIS — F909 Attention-deficit hyperactivity disorder, unspecified type: Secondary | ICD-10-CM | POA: Diagnosis not present

## 2020-08-22 ENCOUNTER — Telehealth: Payer: Self-pay

## 2020-08-22 NOTE — Telephone Encounter (Signed)
Patient called and asked if a nurse can speak to him about his medications. He said that he has a few questions and he wants to make sure he is taking them correctly. Please call him at [phone]

## 2020-08-22 NOTE — Telephone Encounter (Signed)
Spoke to pt said he needs to know what blood pressure medications he should be taking cause the pharmacy keeps filling Lisinopril. Told pt he should be taking Lisinopril-HCTZ 20-12.5 mg daily and Metoprolol 50 mg daily. Pt verbalized understanding and said that is what he has.

## 2020-08-27 ENCOUNTER — Ambulatory Visit: Payer: Medicare PPO | Admitting: Family Medicine

## 2020-09-02 ENCOUNTER — Ambulatory Visit (INDEPENDENT_AMBULATORY_CARE_PROVIDER_SITE_OTHER): Payer: Medicare PPO

## 2020-09-02 DIAGNOSIS — Z0000 Encounter for general adult medical examination without abnormal findings: Secondary | ICD-10-CM | POA: Diagnosis not present

## 2020-09-02 NOTE — Patient Instructions (Signed)
Edward Zavala , Thank you for taking time to come for your Medicare Wellness Visit. I appreciate your ongoing commitment to your health goals. Please review the following plan we discussed and let me know if I can assist you in the future.   Screening recommendations/referrals: Colonoscopy: Done 11/22/17 repeat in 10 years 11/23/27 Recommended yearly ophthalmology/optometry visit for glaucoma screening and checkup Recommended yearly dental visit for hygiene and checkup  Vaccinations: Influenza vaccine: Due 09/16/20 Pneumococcal vaccine: Completed Tdap vaccine: Done 12/24/14 repeat in 10 years 12/23/24 Shingles vaccine: Completed 12/01/17 & 02/01/18   Covid-19: Completed 1/25, 2/15, 11/07/19 & 07/18/20  Advanced directives: Please bring a copy of your health care power of attorney and living will to the office at your convenience.  Conditions/risks identified: none at this time  Next appointment: Follow up in one year for your annual wellness visit.   Preventive Care 75 Years and Older, Male Preventive care refers to lifestyle choices and visits with your health care provider that can promote health and wellness. What does preventive care include? A yearly physical exam. This is also called an annual well check. Dental exams once or twice a year. Routine eye exams. Ask your health care provider how often you should have your eyes checked. Personal lifestyle choices, including: Daily care of your teeth and gums. Regular physical activity. Eating a healthy diet. Avoiding tobacco and drug use. Limiting alcohol use. Practicing safe sex. Taking low doses of aspirin every day. Taking vitamin and mineral supplements as recommended by your health care provider. What happens during an annual well check? The services and screenings done by your health care provider during your annual well check will depend on your age, overall health, lifestyle risk factors, and family history of disease. Counseling   Your health care provider may ask you questions about your: Alcohol use. Tobacco use. Drug use. Emotional well-being. Home and relationship well-being. Sexual activity. Eating habits. History of falls. Memory and ability to understand (cognition). Work and work Statistician. Screening  You may have the following tests or measurements: Height, weight, and BMI. Blood pressure. Lipid and cholesterol levels. These may be checked every 5 years, or more frequently if you are over 53 years old. Skin check. Lung cancer screening. You may have this screening every year starting at age 49 if you have a 30-pack-year history of smoking and currently smoke or have quit within the past 15 years. Fecal occult blood test (FOBT) of the stool. You may have this test every year starting at age 69. Flexible sigmoidoscopy or colonoscopy. You may have a sigmoidoscopy every 5 years or a colonoscopy every 10 years starting at age 30. Prostate cancer screening. Recommendations will vary depending on your family history and other risks. Hepatitis C blood test. Hepatitis B blood test. Sexually transmitted disease (STD) testing. Diabetes screening. This is done by checking your blood sugar (glucose) after you have not eaten for a while (fasting). You may have this done every 1-3 years. Abdominal aortic aneurysm (AAA) screening. You may need this if you are a current or former smoker. Osteoporosis. You may be screened starting at age 71 if you are at high risk. Talk with your health care provider about your test results, treatment options, and if necessary, the need for more tests. Vaccines  Your health care provider may recommend certain vaccines, such as: Influenza vaccine. This is recommended every year. Tetanus, diphtheria, and acellular pertussis (Tdap, Td) vaccine. You may need a Td booster every 10 years. Zoster  vaccine. You may need this after age 24. Pneumococcal 13-valent conjugate (PCV13) vaccine.  One dose is recommended after age 60. Pneumococcal polysaccharide (PPSV23) vaccine. One dose is recommended after age 4. Talk to your health care provider about which screenings and vaccines you need and how often you need them. This information is not intended to replace advice given to you by your health care provider. Make sure you discuss any questions you have with your health care provider. Document Released: 03/01/2015 Document Revised: 10/23/2015 Document Reviewed: 12/04/2014 Elsevier Interactive Patient Education  2017 [place] Prevention in the Home Falls can cause injuries. They can happen to people of all ages. There are many things you can do to make your home safe and to help prevent falls. What can I do on the outside of my home? Regularly fix the edges of walkways and driveways and fix any cracks. Remove anything that might make you trip as you walk through a door, such as a raised step or threshold. Trim any bushes or trees on the path to your home. Use bright outdoor lighting. Clear any walking paths of anything that might make someone trip, such as rocks or tools. Regularly check to see if handrails are loose or broken. Make sure that both sides of any steps have handrails. Any raised decks and porches should have guardrails on the edges. Have any leaves, snow, or ice cleared regularly. Use sand or salt on walking paths during winter. Clean up any spills in your garage right away. This includes oil or grease spills. What can I do in the bathroom? Use night lights. Install grab bars by the toilet and in the tub and shower. Do not use towel bars as grab bars. Use non-skid mats or decals in the tub or shower. If you need to sit down in the shower, use a plastic, non-slip stool. Keep the floor dry. Clean up any water that spills on the floor as soon as it happens. Remove soap buildup in the tub or shower regularly. Attach bath mats securely with double-sided  non-slip rug tape. Do not have throw rugs and other things on the floor that can make you trip. What can I do in the bedroom? Use night lights. Make sure that you have a light by your bed that is easy to reach. Do not use any sheets or blankets that are too big for your bed. They should not hang down onto the floor. Have a firm chair that has side arms. You can use this for support while you get dressed. Do not have throw rugs and other things on the floor that can make you trip. What can I do in the kitchen? Clean up any spills right away. Avoid walking on wet floors. Keep items that you use a lot in easy-to-reach places. If you need to reach something above you, use a strong step stool that has a grab bar. Keep electrical cords out of the way. Do not use floor polish or wax that makes floors slippery. If you must use wax, use non-skid floor wax. Do not have throw rugs and other things on the floor that can make you trip. What can I do with my stairs? Do not leave any items on the stairs. Make sure that there are handrails on both sides of the stairs and use them. Fix handrails that are broken or loose. Make sure that handrails are as long as the stairways. Check any carpeting to make sure that it  is firmly attached to the stairs. Fix any carpet that is loose or worn. Avoid having throw rugs at the top or bottom of the stairs. If you do have throw rugs, attach them to the floor with carpet tape. Make sure that you have a light switch at the top of the stairs and the bottom of the stairs. If you do not have them, ask someone to add them for you. What else can I do to help prevent falls? Wear shoes that: Do not have high heels. Have rubber bottoms. Are comfortable and fit you well. Are closed at the toe. Do not wear sandals. If you use a stepladder: Make sure that it is fully opened. Do not climb a closed stepladder. Make sure that both sides of the stepladder are locked into place. Ask  someone to hold it for you, if possible. Clearly mark and make sure that you can see: Any grab bars or handrails. First and last steps. Where the edge of each step is. Use tools that help you move around (mobility aids) if they are needed. These include: Canes. Walkers. Scooters. Crutches. Turn on the lights when you go into a dark area. Replace any light bulbs as soon as they burn out. Set up your furniture so you have a clear path. Avoid moving your furniture around. If any of your floors are uneven, fix them. If there are any pets around you, be aware of where they are. Review your medicines with your doctor. Some medicines can make you feel dizzy. This can increase your chance of falling. Ask your doctor what other things that you can do to help prevent falls. This information is not intended to replace advice given to you by your health care provider. Make sure you discuss any questions you have with your health care provider. Document Released: 11/29/2008 Document Revised: 07/11/2015 Document Reviewed: 03/09/2014 Elsevier Interactive Patient Education  2017 [employer].

## 2020-09-02 NOTE — Progress Notes (Addendum)
Virtual Visit via Telephone Note  I connected with  Edward Zavala on 09/02/20 at  8:45 AM EDT by telephone and verified that I am speaking with the correct person using two identifiers.  Medicare Annual Wellness visit completed telephonically due to Covid-19 pandemic.   Persons participating in this call: This Health Coach and this patient.   Location: Patient: Home Provider: Office   I discussed the limitations, risks, security and privacy concerns of performing an evaluation and management service by telephone and the availability of in person appointments. The patient expressed understanding and agreed to proceed.  Unable to perform video visit due to video visit attempted and failed and/or patient does not have video capability.   Some vital signs may be absent or patient reported.   Willette Brace, LPN   Subjective:   Edward Zavala is a 75 y.o. male who presents for an Initial Medicare Annual Wellness Visit.  Review of Systems     Cardiac Risk Factors include: advanced age (>7men, >18 women);hypertension;dyslipidemia;male gender;obesity (BMI >30kg/m2)     Objective:    There were no vitals filed for this visit. There is no height or weight on file to calculate BMI.  Advanced Directives 09/02/2020 08/08/2019 04/24/2017 12/01/2011 11/25/2011  Does Patient Have a Medical Advance Directive? Yes Yes No Patient has advance directive, copy not in chart Patient has advance directive, copy not in chart  Type of Advance Directive Living will Living will;Healthcare Power of [place];Living will [place];Living will  Copy of [place] in Chart? - No - copy requested - - -  Would patient like information on creating a medical advance directive? - - No - Patient declined - -  Pre-existing out of facility DNR order (yellow form or pink MOST form) - - - No No    Current Medications (verified) Outpatient Encounter  Medications as of 09/02/2020  Medication Sig   ALPRAZolam (XANAX) 0.25 MG tablet Take 0.25 mg by mouth as needed.   Armodafinil 250 MG tablet Take 250 mg by mouth daily.   atorvastatin (LIPITOR) 20 MG tablet Take 20 mg by mouth daily.    busPIRone (BUSPAR) 15 MG tablet Take 15 mg by mouth 2 (two) times daily.    butalbital-aspirin-caffeine (FIORINAL) 50-325-40 MG per capsule Take 1 capsule by mouth every 6 (six) hours as needed for headache. Headache   DULoxetine (CYMBALTA) 30 MG capsule Take 60 mg by mouth daily.   hydroxypropyl methylcellulose (ISOPTO TEARS) 2.5 % ophthalmic solution Place 1 drop into both eyes as needed. Dry eyes   lisinopril-hydrochlorothiazide (ZESTORETIC) 20-12.5 MG tablet Take 1 tablet by mouth daily.   metoprolol succinate (TOPROL-XL) 50 MG 24 hr tablet Take 1 tablet (50 mg total) by mouth daily. Take with or immediately following a meal.   omeprazole (PRILOSEC) 20 MG capsule Take 20 mg by mouth daily.   ondansetron (ZOFRAN) 4 MG tablet Take 4 mg by mouth every 8 (eight) hours as needed for nausea or vomiting.   Sildenafil Citrate (VIAGRA PO) Take 100 mg by mouth.   No facility-administered encounter medications on file as of 09/02/2020.    Allergies (verified) Patient has no known allergies.   History: Past Medical History:  Diagnosis Date   Anxiety    Depression    Dyslipidemia (high LDL; low HDL)    Gastroparesis    GERD (gastroesophageal reflux disease)    H/O acute pancreatitis    H/O gastroesophageal reflux (  GERD)    Headache(784.0)    occasinal    Hyperlipidemia    Hypertension    Stroke [facility])    Past Surgical History:  Procedure Laterality Date   CHOLECYSTECTOMY     ESOPHAGOGASTRODUODENOSCOPY (EGD) WITH PROPOFOL N/A 08/08/2019   Procedure: ESOPHAGOGASTRODUODENOSCOPY (EGD) WITH PROPOFOL;  Surgeon: Arta Silence, MD;  Location: [facility];  Service: Endoscopy;  Laterality: N/A;   EUS N/A 08/08/2019   Procedure: UPPER ENDOSCOPIC ULTRASOUND  (EUS) LINEAR;  Surgeon: Arta Silence, MD;  [facility];  Service: Endoscopy;  Laterality: N/A;  FNA   EYE SURGERY     age 33 months old    FINE NEEDLE ASPIRATION N/A 08/08/2019   Procedure: FINE NEEDLE ASPIRATION (FNA) LINEAR;  Surgeon: Arta Silence, MD;  [facility];  Service: Endoscopy;  Laterality: N/A;   HERNIA REPAIR     Port Site Hernia   LAPAROSCOPIC NISSEN FUNDOPLICATION     VENTRAL HERNIA REPAIR  12/01/2011   Procedure: LAPAROSCOPIC VENTRAL HERNIA;  Surgeon: Edward Jolly, MD;  Location: [facility];  Service: General;  Laterality: N/A;  x2  with mesh   Family History  Problem Relation Age of Onset   Cancer Mother 80       Breast Cancer   Depression Mother    High blood pressure Mother    Alcohol abuse Father    Early death Maternal Grandmother    Heart attack Maternal Grandmother    Depression Daughter    High blood pressure Daughter    Asthma Son    Depression Son    Social History   Socioeconomic History   Marital status: Married    Spouse name: Not on file   Number of children: Not on file   Years of education: Not on file   Highest education level: Not on file  Occupational History   Not on file  Tobacco Use   Smoking status: Former    Types: Cigarettes    Quit date: 09/30/1976    Years since quitting: 43.9   Smokeless tobacco: Never  Vaping Use   Vaping Use: Never used  Substance and Sexual Activity   Alcohol use: Yes    Alcohol/week: 10.0 - 14.0 standard drinks    Types: 10 - 14 Standard drinks or equivalent per week   Drug use: No   Sexual activity: Yes    Partners: Female  Other Topics Concern   Not on file  Social History Narrative   Not on file   Social Determinants of Health   Financial Resource Strain: Low Risk    Difficulty of Paying Living Expenses: Not hard at all  Food Insecurity: No Food Insecurity   Worried About Charity fundraiser in the Last Year: Never true   [place] in the Last Year: Never  true  Transportation Needs: No Transportation Needs   Lack of Transportation (Medical): No   Lack of Transportation (Non-Medical): No  Physical Activity: Inactive   Days of Exercise per Week: 0 days   Minutes of Exercise per Session: 0 min  Stress: Stress Concern Present   Feeling of Stress : To some extent  Social Connections: Unknown   Frequency of Communication with Friends and Family: More than three times a week   Frequency of Social Gatherings with Friends and Family: More than three times a week   Attends Religious Services: Not on file   Active Member of Clubs or Organizations: Not on file   Attends [employer]  or Organization Meetings: Not on file   Marital Status: Married    Tobacco Counseling Counseling given: Not Answered   Clinical Intake:  Pre-visit preparation completed: Yes  Pain : No/denies pain     BMI - recorded: 30.73 Nutritional Status: BMI > 30  Obese Nutritional Risks: None Diabetes: No  How often do you need to have someone help you when you read instructions, pamphlets, or other written materials from your doctor or pharmacy?: 1 - Never  Diabetic?No  Interpreter Needed?: No  Information entered by :: Charlott Rakes, LPN   Activities of Daily Living In your present state of health, do you have any difficulty performing the following activities: 09/02/2020 02/14/2020  Hearing? N N  Vision? N N  Difficulty concentrating or making decisions? N N  Walking or climbing stairs? N N  Dressing or bathing? N N  Doing errands, shopping? N N  Preparing Food and eating ? N -  Using the Toilet? N -  In the past six months, have you accidently leaked urine? Y -  Do you have problems with loss of bowel control? N -  Managing your Medications? N -  Managing your Finances? N -  Housekeeping or managing your Housekeeping? N -  Some recent data might be hidden    Patient Care Team: Leamon Arnt, MD as PCP - General (Family Medicine) Garvin Fila, MD  as Consulting Physician (Neurology) Clarene Essex, MD as Consulting Physician (Gastroenterology) Katy Apo, MD as Consulting Physician (Ophthalmology) Renda Rolls, Jennefer Bravo, MD as Referring Physician (Dermatology) Lucas Mallow, MD as Consulting Physician (Urology)  Indicate any recent Medical Services you may have received from other than [facility] providers in the past year (date may be approximate).     Assessment:   This is a routine wellness examination for Edward Zavala.  Hearing/Vision screen Hearing Screening - Comments:: No issues  Vision Screening - Comments:: Pt follows  up with dr Tanda Rockers for annual eye exams  Dietary issues and exercise activities discussed: Current Exercise Habits: The patient does not participate in regular exercise at present   Goals Addressed             This Visit's Progress    Patient Stated       None at this time       Depression Screen PHQ 2/9 Scores 09/02/2020 02/14/2020  PHQ - 2 Score 1 0    Fall Risk Fall Risk  09/02/2020 02/14/2020 11/25/2017 05/25/2017  Falls in the past year? 0 0 Yes No  Number falls in past yr: 0 0 1 -  Injury with Fall? 0 0 No -  Follow up Falls prevention discussed - - -    FALL RISK PREVENTION PERTAINING TO THE HOME:  Any stairs in or around the home? Yes  If so, are there any without handrails? No  Home free of loose throw rugs in walkways, pet beds, electrical cords, etc? Yes  Adequate lighting in your home to reduce risk of falls? Yes   TIMED UP AND GO:  Was the test performed? No .      Cognitive Function:     6CIT Screen 09/02/2020  What Year? 0 points  What month? 0 points  What time? 0 points  Count back from 20 0 points  Months in reverse 0 points  Repeat phrase 0 points  Total Score 0    Immunizations Immunization History  Administered Date(s) Administered   Influenza Split 12/02/2011, 10/26/2014, 12/17/2016  Influenza, High Dose Seasonal PF 01/21/2016, 12/01/2017, 10/31/2019    Influenza,inj,Quad PF,6+ Mos 10/27/2018   PFIZER(Purple Top)SARS-COV-2 Vaccination 03/13/2019, 04/03/2019, 11/07/2019, 07/18/2020   Pneumococcal Conjugate-13 09/27/2013   Pneumococcal Polysaccharide-23 09/24/2011   Td 10/18/2003, 12/24/2014   Zoster Recombinat (Shingrix) 12/01/2017, 02/01/2018   Zoster, Live 01/19/2006    TDAP status: Up to date  Flu Vaccine status: Up to date  Pneumococcal vaccine status: Up to date  Covid-19 vaccine status: Completed vaccines  Qualifies for Shingles Vaccine? Yes   Zostavax completed Yes   Shingrix Completed?: Yes  Screening Tests Health Maintenance  Topic Date Due   INFLUENZA VACCINE  09/16/2020   TETANUS/TDAP  12/23/2024   COLONOSCOPY (Pts 45-81yrs Insurance coverage will need to be confirmed)  11/23/2027   COVID-19 Vaccine  Completed   PNA vac Low Risk Adult  Completed   Zoster Vaccines- Shingrix  Completed   HPV VACCINES  Aged Out   Hepatitis C Screening  Discontinued    Health Maintenance  There are no preventive care reminders to display for this patient.   Colorectal cancer screening: Type of screening: Colonoscopy. Completed 11/22/17. Repeat every 10 years  Additional Screening:  Hepatitis C Screening: does not qualify;   Vision Screening: Recommended annual ophthalmology exams for early detection of glaucoma and other disorders of the eye. Is the patient up to date with their annual eye exam?  Yes  Who is the provider or what is the name of the office in which the patient attends annual eye exams? Dr Tanda Rockers If pt is not established with a provider, would they like to be referred to a provider to establish care? No .   Dental Screening: Recommended annual dental exams for proper oral hygiene  Community Resource Referral / Chronic Care Management: CRR required this visit?  No   CCM required this visit?  No      Plan:     I have personally reviewed and noted the following in the patient's chart:   Medical and  social history Use of alcohol, tobacco or illicit drugs  Current medications and supplements including opioid prescriptions. Patient is not currently taking opioid prescriptions. Functional ability and status Nutritional status Physical activity Advanced directives List of other physicians Hospitalizations, surgeries, and ER visits in previous 12 months Vitals Screenings to include cognitive, depression, and falls Referrals and appointments  In addition, I have reviewed and discussed with patient certain preventive protocols, quality metrics, and best practice recommendations. A written personalized care plan for preventive services as well as general preventive health recommendations were provided to patient.     Willette Brace, LPN   04/16/6008   Nurse Notes: None

## 2020-10-07 DIAGNOSIS — F331 Major depressive disorder, recurrent, moderate: Secondary | ICD-10-CM | POA: Diagnosis not present

## 2020-10-14 DIAGNOSIS — F331 Major depressive disorder, recurrent, moderate: Secondary | ICD-10-CM | POA: Diagnosis not present

## 2020-10-14 DIAGNOSIS — F4323 Adjustment disorder with mixed anxiety and depressed mood: Secondary | ICD-10-CM | POA: Diagnosis not present

## 2020-10-24 DIAGNOSIS — H25013 Cortical age-related cataract, bilateral: Secondary | ICD-10-CM | POA: Diagnosis not present

## 2020-10-24 DIAGNOSIS — H5201 Hypermetropia, right eye: Secondary | ICD-10-CM | POA: Diagnosis not present

## 2020-11-04 DIAGNOSIS — F4323 Adjustment disorder with mixed anxiety and depressed mood: Secondary | ICD-10-CM | POA: Diagnosis not present

## 2020-11-04 DIAGNOSIS — F331 Major depressive disorder, recurrent, moderate: Secondary | ICD-10-CM | POA: Diagnosis not present

## 2020-11-12 IMAGING — MR MR ABDOMEN WO/W CM
11 of 18 series · 28 of 48 positions shown · IV contrast (15 multihance)
Comparison: CT abdomen pelvis, 06/29/2019, 03/27/2013

CLINICAL DATA: Follow-up pancreatic lesion

EXAM:
MRI ABDOMEN WITHOUT AND WITH CONTRAST
TECHNIQUE: Multiplanar multisequence MR imaging of the abdomen was performed
both before and after the administration of intravenous contrast.
CONTRAST:  15mL MULTIHANCE GADOBENATE DIMEGLUMINE 529 MG/ML IV SOLN

[Series 3: T2 · coronal · 5.0mm · 1.56mm/px · 1 of 44 slices shown (1 of 3)]
[im 1/44]
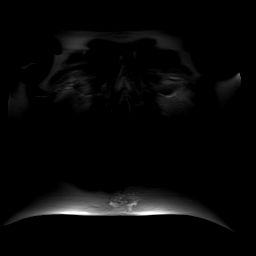

[Series 4: T2 · axial · 5.0mm · 1.72mm/px · 1 of 40 slices shown (2 of 3)]
[im 1/40]
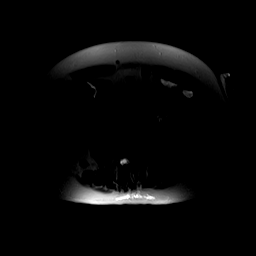

[Series 5: axial in out · axial · 6.0mm · 0.78mm/px · z∈[-117,+128]mm · 3 of 72 slices shown]
[im 1/72]
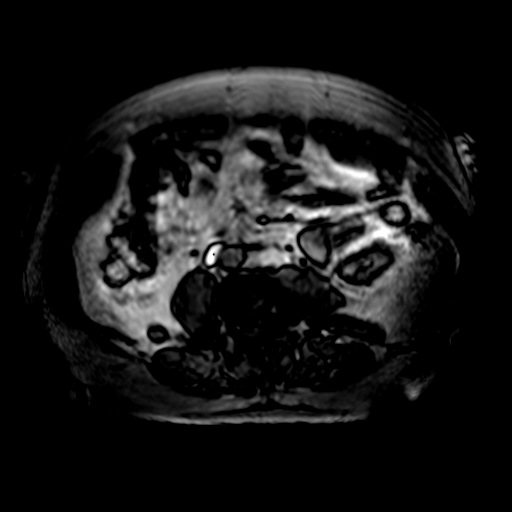
[im 36/72]
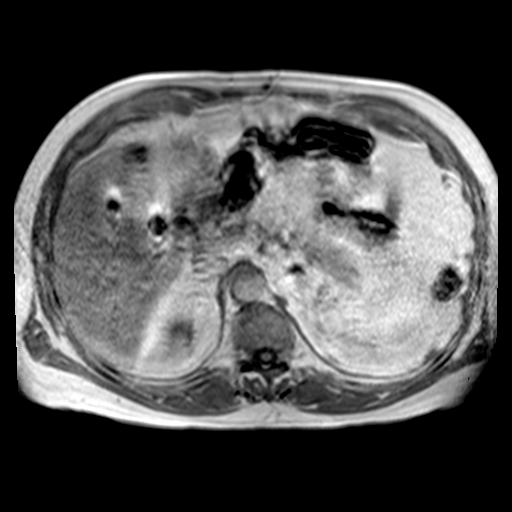
[im 72/72]
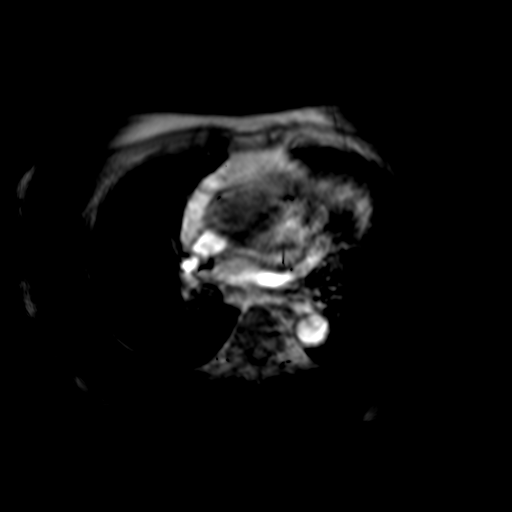

[Series 6: T2 · axial · 6.0mm · 0.78mm/px · z∈[-135,+146]mm · 2 of 40 slices shown (3 of 3)]
[im 1/40]
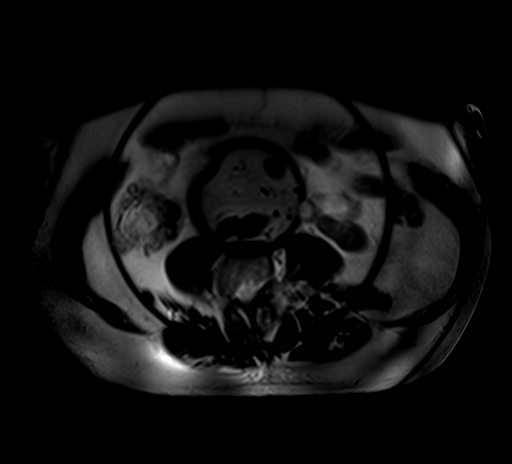
[im 40/40]
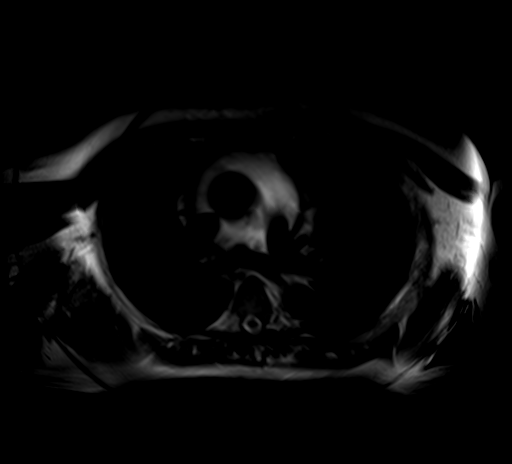

[Series 11: ep2d_diff_b50_500_800_p2_trig · axial · 6.0mm · 2.29mm/px · z∈[-135,+146]mm · 5 of 120 slices shown]
[im 1/120]
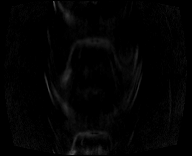
[im 30/120]
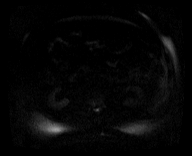
[im 60/120]
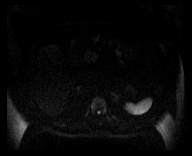
[im 90/120]
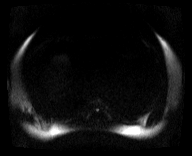
[im 120/120]
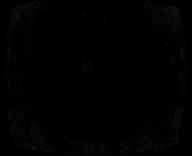

[Series 12: ep2d_[id number]_500_800_p2_trig_adc · axial · 6.0mm · 2.29mm/px · z∈[-135,+146]mm · 2 of 40 slices shown]
[im 1/40]
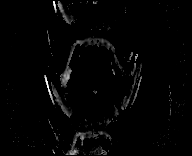
[im 40/40]
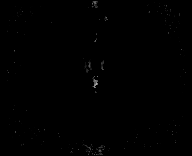

[Series 13: axial tru fisp · axial · 5.0mm · 1.72mm/px · z∈[-130,+141]mm · 2 of 48 slices shown]
[im 1/48]
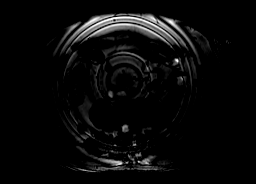
[im 48/48]
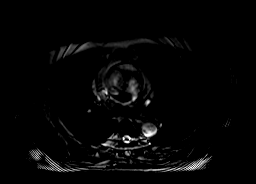

[Series 14: T1 dynamic · axial · non-contrast · 3.0mm · 0.78mm/px · z∈[-102,+135]mm · 3 of 80 slices shown]
[im 1/80]
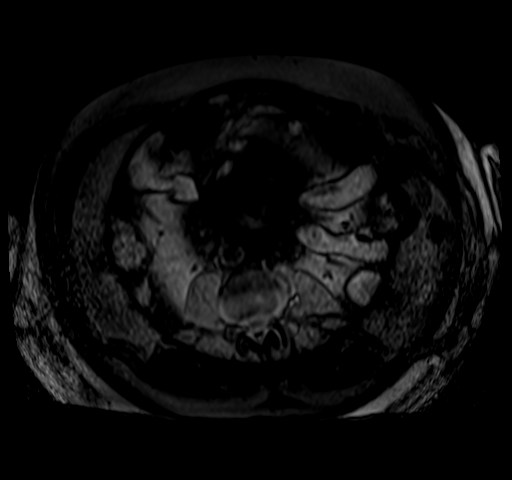
[im 40/80]
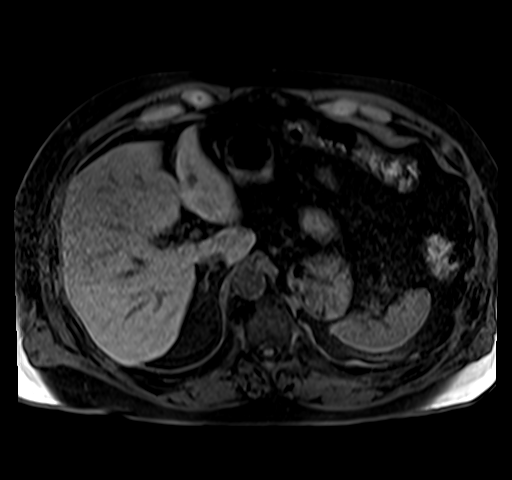
[im 80/80]
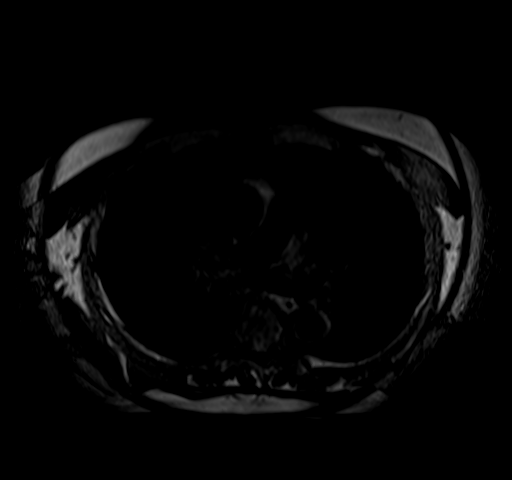

[Series 15: post 25 sec · axial · 3.0mm · 0.78mm/px · z∈[-102,+135]mm · 3 of 80 slices shown]
[im 1/80]
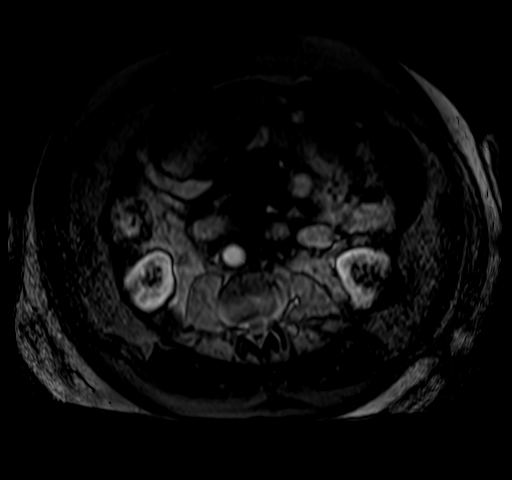
[im 40/80]
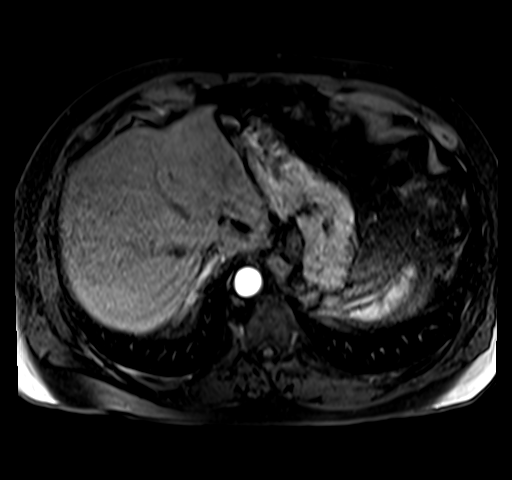
[im 80/80]
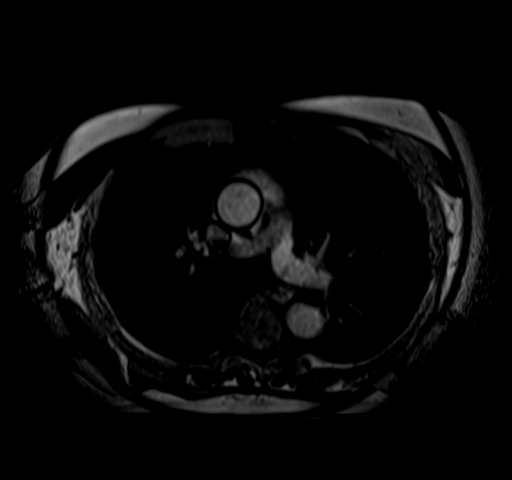

[Series 16: post 25 sec_sub · axial · 3.0mm · 0.78mm/px · z∈[-102,+135]mm · 3 of 80 slices shown]
[im 1/80]
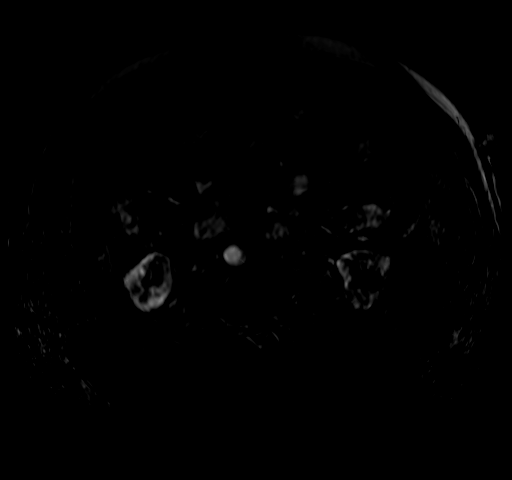
[im 40/80]
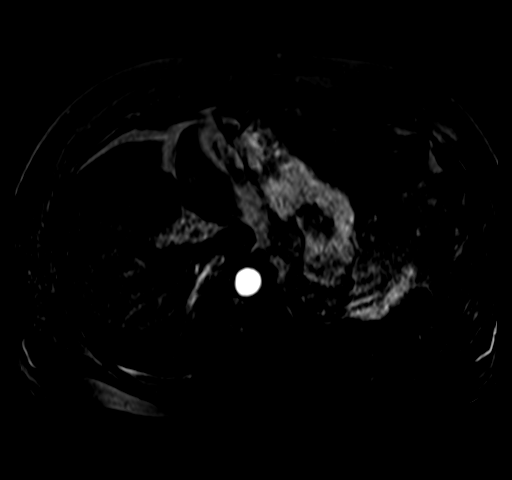
[im 80/80]
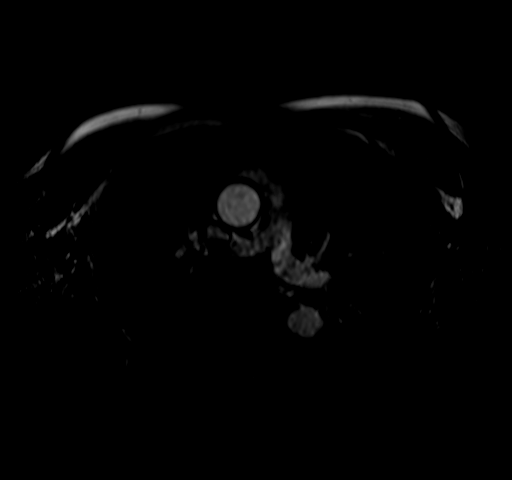

[Series 17: post 45 sec · axial · 3.0mm · 0.78mm/px · z∈[-102,+135]mm · 3 of 80 slices shown]
[im 1/80]
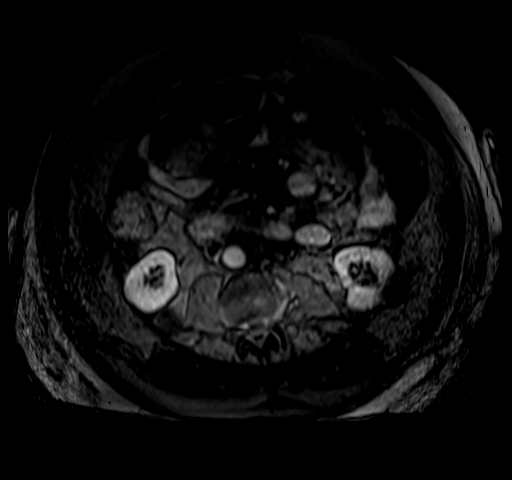
[im 40/80]
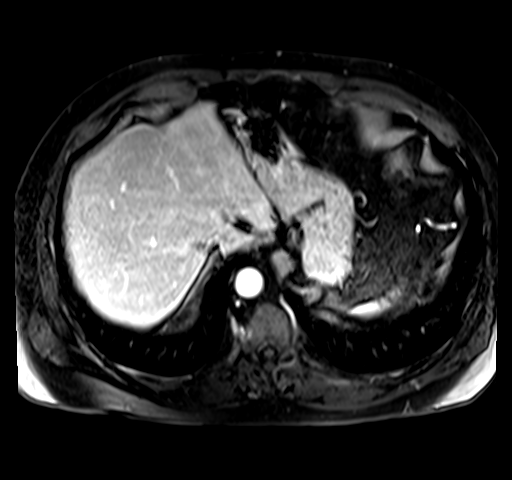
[im 80/80]
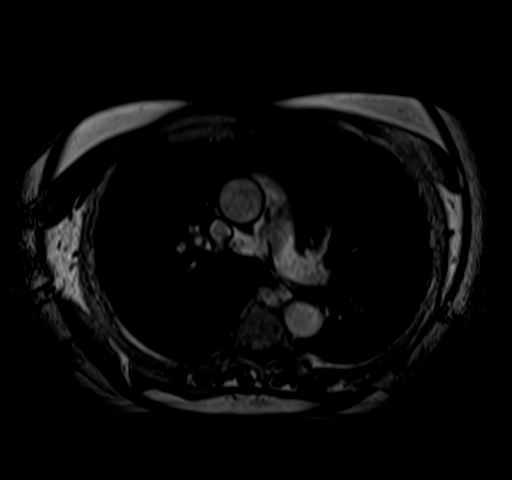

[28 of 48 positions shown; findings below may reference images not displayed]

FINDINGS: Lower chest: No acute abnormality.

Hepatobiliary: No solid liver abnormality is seen. Lobulated fluid
signal cyst of the left lobe of the liver. Small incidental benign
hemangioma of the left lobe of the liver (series 15, image 48). To
the status post cholecystectomy.

Pancreas: There is a multilobulated, fluid signal microcystic lesion
of the pancreatic head measuring 3.2 x 2.7 x 2.4 cm (series 4, image
29). There is no evident contrast enhancement. No pancreatic ductal
dilatation or surrounding inflammatory changes.

Spleen: Normal in size without significant abnormality.

Adrenals/Urinary Tract: Adrenal glands are unremarkable. Kidneys are
normal, without renal calculi, solid lesion, or hydronephrosis.
Bladder is unremarkable.

Stomach/Bowel: Status post hiatal hernia repair. Stomach is within
normal limits. Appendix appears normal. No evidence of bowel wall
thickening, distention, or inflammatory changes.

Vascular/Lymphatic: No significant vascular findings are present. No
enlarged abdominal lymph nodes.

Other: No abdominal wall hernia or abnormality. No abdominopelvic
ascites.

Musculoskeletal: No acute or significant osseous findings.
IMPRESSION: There is a multilobulated, fluid signal microcystic lesion of the
pancreatic head measuring 3.2 x 2.7 x 2.4 cm. There is no evident
contrast enhancement. Findings are most consistent with serous
cystadenoma. Consider endoscopic ultrasound and fine needle
aspiration for tissue diagnosis.

## 2020-11-19 DIAGNOSIS — F411 Generalized anxiety disorder: Secondary | ICD-10-CM | POA: Diagnosis not present

## 2020-11-19 DIAGNOSIS — F909 Attention-deficit hyperactivity disorder, unspecified type: Secondary | ICD-10-CM | POA: Diagnosis not present

## 2020-11-19 DIAGNOSIS — F330 Major depressive disorder, recurrent, mild: Secondary | ICD-10-CM | POA: Diagnosis not present

## 2020-12-02 DIAGNOSIS — F330 Major depressive disorder, recurrent, mild: Secondary | ICD-10-CM | POA: Diagnosis not present

## 2020-12-02 DIAGNOSIS — F411 Generalized anxiety disorder: Secondary | ICD-10-CM | POA: Diagnosis not present

## 2020-12-02 DIAGNOSIS — F909 Attention-deficit hyperactivity disorder, unspecified type: Secondary | ICD-10-CM | POA: Diagnosis not present

## 2020-12-16 DIAGNOSIS — F902 Attention-deficit hyperactivity disorder, combined type: Secondary | ICD-10-CM | POA: Diagnosis not present

## 2020-12-16 DIAGNOSIS — F411 Generalized anxiety disorder: Secondary | ICD-10-CM | POA: Diagnosis not present

## 2020-12-16 DIAGNOSIS — F330 Major depressive disorder, recurrent, mild: Secondary | ICD-10-CM | POA: Diagnosis not present

## 2020-12-18 ENCOUNTER — Telehealth (INDEPENDENT_AMBULATORY_CARE_PROVIDER_SITE_OTHER): Payer: Medicare PPO | Admitting: Family

## 2020-12-18 ENCOUNTER — Encounter: Payer: Self-pay | Admitting: Family

## 2020-12-18 VITALS — Temp 100.3°F | Ht 67.0 in | Wt 196.2 lb

## 2020-12-18 DIAGNOSIS — U071 COVID-19: Secondary | ICD-10-CM | POA: Diagnosis not present

## 2020-12-18 MED ORDER — MOLNUPIRAVIR EUA 200MG CAPSULE: 4.0000 | ORAL_CAPSULE | Freq: Two times a day (BID) | ORAL | 0 refills | Status: AC

## 2020-12-18 NOTE — Progress Notes (Signed)
MyChart Video Visit    Virtual Visit via Video Note   This visit type was conducted due to national recommendations for restrictions regarding the COVID-19 Pandemic (e.g. social distancing) in an effort to limit this patient's exposure and mitigate transmission in our community. This patient is at least at moderate risk for complications without adequate follow up. This format is felt to be most appropriate for this patient at this time. Physical exam was limited by quality of the video and audio technology used for the visit. CMA was able to get the patient set up on a video visit.  Patient location: Home. Patient and provider in visit Provider location: Office  I discussed the limitations of evaluation and management by telemedicine and the availability of in person appointments. The patient expressed understanding and agreed to proceed.  Visit Date: 12/18/2020  Today's healthcare provider: Jeanie Sewer, NP     Subjective:    Patient ID: Edward Zavala, male    DOB: 06-14-1945, 75 y.o.   MRN: [id number]  Chief Complaint  Patient presents with   Covid Positive    Symptoms started Sunday evening. Started with a cough, and now has a sore throat. Positive result yesterday. He has taken Nyquil.    Sore Throat   Cough   HPI: Upper Respiratory Infection: Patient complains of symptoms of a URI. Symptoms include cough and sore throat. Onset of symptoms was 3 days ago, gradually worsening since that time. He also c/o fever 100.4   for the past 3 days .  He is drinking moderate amounts of fluids. Evaluation to date: none. Treatment to date: none.  Past Medical History:  Diagnosis Date   Anxiety    Depression    Dyslipidemia (high LDL; low HDL)    Gastroparesis    GERD (gastroesophageal reflux disease)    H/O acute pancreatitis    H/O gastroesophageal reflux (GERD)    Headache(784.0)    occasinal    Hyperlipidemia    Hypertension    Stroke [facility])     Past Surgical  History:  Procedure Laterality Date   CHOLECYSTECTOMY     ESOPHAGOGASTRODUODENOSCOPY (EGD) WITH PROPOFOL N/A 08/08/2019   Procedure: ESOPHAGOGASTRODUODENOSCOPY (EGD) WITH PROPOFOL;  Surgeon: Arta Silence, MD;  [facility];  Service: Endoscopy;  Laterality: N/A;   EUS N/A 08/08/2019   Procedure: UPPER ENDOSCOPIC ULTRASOUND (EUS) LINEAR;  Surgeon: Arta Silence, MD;  [facility];  Service: Endoscopy;  Laterality: N/A;  FNA   EYE SURGERY     age 34 months old    FINE NEEDLE ASPIRATION N/A 08/08/2019   Procedure: FINE NEEDLE ASPIRATION (FNA) LINEAR;  Surgeon: Arta Silence, MD;  [facility];  Service: Endoscopy;  Laterality: N/A;   HERNIA REPAIR     Port Site Hernia   LAPAROSCOPIC NISSEN FUNDOPLICATION     VENTRAL HERNIA REPAIR  12/01/2011   Procedure: LAPAROSCOPIC VENTRAL HERNIA;  Surgeon: Edward Jolly, MD;  [facility];  Service: General;  Laterality: N/A;  x2  with mesh    Outpatient Medications Prior to Visit  Medication Sig Dispense Refill   ALPRAZolam (XANAX) 0.25 MG tablet Take 0.25 mg by mouth as needed.     Armodafinil 250 MG tablet Take 250 mg by mouth daily.     atorvastatin (LIPITOR) 20 MG tablet Take 20 mg by mouth daily.      busPIRone (BUSPAR) 15 MG tablet Take 15 mg by mouth 2 (two) times daily.  butalbital-aspirin-caffeine (FIORINAL) 50-325-40 MG per capsule Take 1 capsule by mouth every 6 (six) hours as needed for headache. Headache     DULoxetine (CYMBALTA) 30 MG capsule Take 60 mg by mouth daily.     hydroxypropyl methylcellulose (ISOPTO TEARS) 2.5 % ophthalmic solution Place 1 drop into both eyes as needed. Dry eyes     lisinopril-hydrochlorothiazide (ZESTORETIC) 20-12.5 MG tablet Take 1 tablet by mouth daily. 90 tablet 3   metoprolol succinate (TOPROL-XL) 50 MG 24 hr tablet Take 1 tablet (50 mg total) by mouth daily. Take with or immediately following a meal. 90 tablet 3   omeprazole (PRILOSEC) 20 MG capsule Take 20  mg by mouth daily.     ondansetron (ZOFRAN) 4 MG tablet Take 4 mg by mouth every 8 (eight) hours as needed for nausea or vomiting.     Sildenafil Citrate (VIAGRA PO) Take 100 mg by mouth.     No facility-administered medications prior to visit.    No Known Allergies      Objective:    Physical Exam Vitals and nursing note reviewed.  Constitutional:      Appearance: Normal appearance.  HENT:     Head: Normocephalic.  Pulmonary:     Effort: Pulmonary effort is normal. No respiratory distress.  Musculoskeletal:     Cervical back: Normal range of motion.  Skin:    General: Skin is dry.  Neurological:     Mental Status: He is alert and oriented to person, place, and time.  Psychiatric:        Mood and Affect: Mood normal.        Behavior: Behavior normal.    Temp 100.3 F (37.9 C) (Temporal)   Ht 5\' 7"  (1.702 m)   Wt 196 lb 3.4 oz (89 kg)   BMI 30.73 kg/m   Wt Readings from Last 3 Encounters:  12/18/20 196 lb 3.4 oz (89 kg)  05/22/20 196 lb 3.2 oz (89 kg)  04/10/20 195 lb 4.8 oz (88.6 kg)       Assessment & Plan:   Problem List Items Addressed This Visit       Other   COVID-19 - Primary    Starting Molnupiravir, pt advised on use & SE, start today. Advised of CDC guidelines for self isolation/ ending isolation.  Advised of safe practice guidelines. Symptom Tier reviewed.  Encouraged to monitor for any worsening symptoms; watch for increased shortness of breath, weakness, and signs of dehydration. Advised when to seek emergency care.  Instructed to rest and hydrate well.  Advised to leave the house during recommended isolation period, only if it is necessary to seek medical care       Relevant Medications   molnupiravir EUA (LAGEVRIO) 200 mg CAPS capsule    Meds ordered this encounter  Medications   molnupiravir EUA (LAGEVRIO) 200 mg CAPS capsule    Sig: Take 4 capsules (800 mg total) by mouth 2 (two) times daily for 5 days.    Dispense:  40 capsule     Refill:  0    Order Specific Question:   Supervising Provider    Answer:   ANDY, CAMILLE L [[id number]]    I discussed the assessment and treatment plan with the patient. The patient was provided an opportunity to ask questions and all were answered. The patient agreed with the plan and demonstrated an understanding of the instructions.   The patient was advised to call back or seek an in-person evaluation if the symptoms worsen  or if the condition fails to improve as anticipated.  I provided 22 minutes of face-to-face time during this encounter.   Jeanie Sewer, NP [place] [phone] (phone) [phone] (fax)  [place]

## 2020-12-18 NOTE — Assessment & Plan Note (Signed)
Starting [facility], pt advised on use & SE, start today. Advised of CDC guidelines for self isolation/ ending isolation.  Advised of safe practice guidelines. Symptom Tier reviewed.  Encouraged to monitor for any worsening symptoms; watch for increased shortness of breath, weakness, and signs of dehydration. Advised when to seek emergency care.  Instructed to rest and hydrate well.  Advised to leave the house during recommended isolation period, only if it is necessary to seek medical care

## 2021-01-08 DIAGNOSIS — F411 Generalized anxiety disorder: Secondary | ICD-10-CM | POA: Diagnosis not present

## 2021-01-08 DIAGNOSIS — F902 Attention-deficit hyperactivity disorder, combined type: Secondary | ICD-10-CM | POA: Diagnosis not present

## 2021-01-08 DIAGNOSIS — F330 Major depressive disorder, recurrent, mild: Secondary | ICD-10-CM | POA: Diagnosis not present

## 2021-01-20 DIAGNOSIS — L700 Acne vulgaris: Secondary | ICD-10-CM | POA: Diagnosis not present

## 2021-01-20 DIAGNOSIS — B356 Tinea cruris: Secondary | ICD-10-CM | POA: Diagnosis not present

## 2021-01-20 DIAGNOSIS — Z85828 Personal history of other malignant neoplasm of skin: Secondary | ICD-10-CM | POA: Diagnosis not present

## 2021-01-20 DIAGNOSIS — L720 Epidermal cyst: Secondary | ICD-10-CM | POA: Diagnosis not present

## 2021-01-20 DIAGNOSIS — L570 Actinic keratosis: Secondary | ICD-10-CM | POA: Diagnosis not present

## 2021-01-24 DIAGNOSIS — F902 Attention-deficit hyperactivity disorder, combined type: Secondary | ICD-10-CM | POA: Diagnosis not present

## 2021-01-24 DIAGNOSIS — F411 Generalized anxiety disorder: Secondary | ICD-10-CM | POA: Diagnosis not present

## 2021-01-24 DIAGNOSIS — F330 Major depressive disorder, recurrent, mild: Secondary | ICD-10-CM | POA: Diagnosis not present

## 2021-02-14 DIAGNOSIS — F330 Major depressive disorder, recurrent, mild: Secondary | ICD-10-CM | POA: Diagnosis not present

## 2021-02-14 DIAGNOSIS — F411 Generalized anxiety disorder: Secondary | ICD-10-CM | POA: Diagnosis not present

## 2021-02-14 DIAGNOSIS — F902 Attention-deficit hyperactivity disorder, combined type: Secondary | ICD-10-CM | POA: Diagnosis not present

## 2021-02-28 DIAGNOSIS — F411 Generalized anxiety disorder: Secondary | ICD-10-CM | POA: Diagnosis not present

## 2021-02-28 DIAGNOSIS — F902 Attention-deficit hyperactivity disorder, combined type: Secondary | ICD-10-CM | POA: Diagnosis not present

## 2021-02-28 DIAGNOSIS — F330 Major depressive disorder, recurrent, mild: Secondary | ICD-10-CM | POA: Diagnosis not present

## 2021-03-04 DIAGNOSIS — F909 Attention-deficit hyperactivity disorder, unspecified type: Secondary | ICD-10-CM | POA: Diagnosis not present

## 2021-03-04 DIAGNOSIS — F330 Major depressive disorder, recurrent, mild: Secondary | ICD-10-CM | POA: Diagnosis not present

## 2021-03-04 DIAGNOSIS — F411 Generalized anxiety disorder: Secondary | ICD-10-CM | POA: Diagnosis not present

## 2021-03-25 DIAGNOSIS — F330 Major depressive disorder, recurrent, mild: Secondary | ICD-10-CM | POA: Diagnosis not present

## 2021-03-25 DIAGNOSIS — F411 Generalized anxiety disorder: Secondary | ICD-10-CM | POA: Diagnosis not present

## 2021-03-25 DIAGNOSIS — F909 Attention-deficit hyperactivity disorder, unspecified type: Secondary | ICD-10-CM | POA: Diagnosis not present

## 2021-03-31 ENCOUNTER — Other Ambulatory Visit: Payer: Self-pay | Admitting: Family Medicine

## 2021-04-02 ENCOUNTER — Other Ambulatory Visit: Payer: Self-pay

## 2021-04-02 ENCOUNTER — Encounter: Payer: Self-pay | Admitting: Family Medicine

## 2021-04-02 ENCOUNTER — Telehealth: Payer: Self-pay

## 2021-04-02 ENCOUNTER — Ambulatory Visit: Payer: Medicare PPO | Admitting: Family Medicine

## 2021-04-02 VITALS — BP 130/79 | HR 70 | Temp 97.7°F | Ht 68.0 in | Wt 203.0 lb

## 2021-04-02 DIAGNOSIS — I10 Essential (primary) hypertension: Secondary | ICD-10-CM | POA: Diagnosis not present

## 2021-04-02 DIAGNOSIS — Z8673 Personal history of transient ischemic attack (TIA), and cerebral infarction without residual deficits: Secondary | ICD-10-CM

## 2021-04-02 DIAGNOSIS — N1831 Chronic kidney disease, stage 3a: Secondary | ICD-10-CM | POA: Diagnosis not present

## 2021-04-02 DIAGNOSIS — E782 Mixed hyperlipidemia: Secondary | ICD-10-CM | POA: Diagnosis not present

## 2021-04-02 DIAGNOSIS — R0609 Other forms of dyspnea: Secondary | ICD-10-CM

## 2021-04-02 NOTE — Telephone Encounter (Signed)
See note

## 2021-04-02 NOTE — Patient Instructions (Signed)
I will release your lab results to you on your MyChart account with further instructions. You may see the results before I do, but when I review them I will send you a message with my report or have my assistant call you if things need to be discussed. Please reply to my message with any questions. Thank you!   Please go to our [facility] office to get your xrays done. You can walk in M-F between 8:30am- noon or 1pm - 5pm. Tell them you are there for xrays ordered by me. They will send me the results, then I will let you know the results with instructions.   Address: [address].  The Xray department is located in the basement.    If you have any questions or concerns, please don't hesitate to send me a message via MyChart or call the office at [phone]. Thank you for visiting with [place] today! It's our pleasure caring for you.

## 2021-04-02 NOTE — Telephone Encounter (Signed)
°  Patient is scheduled for this afternoon at 4pm with Dr. Jonni Sanger.    BREATHING - shortness of breath or sounds breathless Reason for Call Symptomatic / Request for Health Information Initial Comment Caller states, pt has sob, weakness and dizziness. Translation No Nurse Assessment Nurse: Altamease Oiler, RN, Adriana Date/Time (Eastern Time): 04/02/2021 8:59:53 AM Confirm and document reason for call. If symptomatic, describe symptoms. ---pt states he has had noticeable sx for "past week, 10 days". dizziness, sob, at night feels like he needs to "breath deeply". sob on exertion. dizziness mostly postural Does the patient have any new or worsening symptoms? ---Yes Will a triage be completed? ---Yes Related visit to physician within the last 2 weeks? ---No Does the PT have any chronic conditions? (i.e. diabetes, asthma, this includes High risk factors for pregnancy, etc.) ---Yes List chronic conditions. ---anxiety depression htn high cholesterol Is this a behavioral health or substance abuse call? ---No Guidelines Guideline Title Affirmed Question Affirmed Notes Nurse Date/Time (Eastern Time) Breathing Difficulty [1] MILD difficulty breathing (e.g., minimal/no SOB at rest, SOB with walking, pulse <100) AND [2] NEW-onset Crist Fat, Fabio Bering 04/02/2021 9:03:57 AM PLEASE NOTE: All timestamps contained within this report are represented as [place] Standard Time. CONFIDENTIALTY NOTICE: This fax transmission is intended only for the addressee. It contains information that is legally privileged, confidential or otherwise protected from use or disclosure. If you are not the intended recipient, you are strictly prohibited from reviewing, disclosing, copying using or disseminating any of this information or taking any action in reliance on or regarding this information. If you have received this fax in error, please notify us immediately by telephone so that we can arrange for its return to [place]. Phone:  [phone], Toll-Free: [phone], Fax: [phone] Page: 2 of 2 Call Id: [id number] Guidelines Guideline Title Affirmed Question Affirmed Notes Nurse Date/Time Eilene Ghazi Time) or WORSE than normal Disp. Time Eilene Ghazi Time) Disposition Final User 04/02/2021 8:56:58 AM Send to Urgent Queue Lonia Farber 04/02/2021 9:09:52 AM See HCP within 4 Hours (or PCP triage) Yes Altamease Oiler, RN, Fabio Bering Caller Disagree/Comply Comply Caller Understands Yes PreDisposition Call Doctor Care Advice Given Per Guideline SEE HCP (OR PCP TRIAGE) WITHIN 4 HOURS: CALL BACK IF: * You become worse CARE ADVICE given per Breathing Difficulty (Adult) guideline. * IF OFFICE WILL BE OPEN: You need to be seen within the next 3 or 4 hours. Call your doctor (or NP/PA) now or as soon as the office opens. Comments User: Kizzie Fantasia, RN Date/Time Eilene Ghazi Time): 04/02/2021 9:21:06 AM pt transferred to office for appt Referrals REFERRED TO PCP OFFIC

## 2021-04-02 NOTE — Progress Notes (Signed)
Subjective  CC:  Chief Complaint  Patient presents with   Dizziness    When he bends over and losses his balance.   Shortness of Breath    While walking etc.     HPI: Edward Zavala is a 76 y.o. male who presents to the office today to address the problems listed above in the chief complaint. 76 year old male with history of hypertension, hyperlipidemia, TIA due to vertebral artery dissection or embolism in 2020, depression presents due to worsening shortness of breath on exertion.  He reports that over the last year or so, he notices that he gets short of breath when he does extreme heavy lifting and work.  He stays very active most days.  However over the last 1 to 2 weeks he has noticed increased shortness of breath with just walking.  He tends to walk daily.  So this is unusual.  He denies tightness in the chest, chest pain, sweats or nausea.  He did have a nuclear medicine stress test done in 2020 which was negative for ischemia.  He has had CT of the neck when he had his TIA which did not show carotid artery stenosis.  He also complains of feeling lightheaded at times and having a sense of disequilibrium.  There is no pattern to this.  However at times it is related to standing up.  He denies palpitations or racing heart.  No recent trips, flights, lower extremity edema, calf pain or pleuritic chest pain.  No recent fevers or chills.  He did have COVID back in November but he said he did well with that.  I reviewed EKGs from 2000 22,019.  Normal sinus rhythm with Q waves in V1.  He does not have a current cardiologist.  He denies vertigo.  No stressors.  He also complains that at times he will awaken at night and feels the need to take a deep breath in.  He denies snoring.  No history of sleep apnea.  Assessment  1. Dyspnea on exertion   2. Essential hypertension   3. Stage 3a chronic kidney disease ([place])   4. History of transient ischemic attack (TIA) vertebral artery: embolism or  dissection 2018   5. Mixed hyperlipidemia      Plan   Hypertension f/u: BP control is fairly well controlled.  Orthostatic blood pressures are negative.  He takes Toprol-XL 50 mg daily and lisinopril HCTZ 20/12.5 daily. Hyperlipidemia f/u: Well-controlled on Lipitor 20 mg daily. Dyspnea on exertion: Unclear cause.  He does have cardiovascular risk factors.  No significant changes on his EKG.  Will refer to cardiology to assess need for further cardiac testing.  Patient told to avoid significant exertion.  Take aspirin daily.  This is a chronic medication for him.  Check chest x-ray.  Consider sleep study.  Continue blood pressure medications.  To ER for chest pain Check lab work Education regarding management of these chronic disease states was given. Management strategies discussed on successive visits include dietary and exercise recommendations, goals of achieving and maintaining IBW, and lifestyle modifications aiming for adequate sleep and minimizing stressors.   I spent a total of 45 minutes for this patient encounter. Time spent included preparation, face-to-face counseling with the patient and coordination of care, review of chart and records, and documentation of the encounter.  Follow up: 3 months for complete physical, sooner for follow-up if needed.  Orders Placed This Encounter  Procedures   DG Chest 2 View   CBC  with Differential/Platelet   Comprehensive metabolic panel   Lipid panel   TSH   EKG 12-Lead   No orders of the defined types were placed in this encounter.     BP Readings from Last 3 Encounters:  04/02/21 130/79  05/22/20 130/88  04/10/20 (!) 124/98   Wt Readings from Last 3 Encounters:  04/02/21 203 lb (92.1 kg)  12/18/20 196 lb 3.4 oz (89 kg)  05/22/20 196 lb 3.2 oz (89 kg)    Lab Results  Component Value Date   CHOL 134 04/10/2020   CHOL 106 04/25/2017   CHOL  02/18/2009    123        ATP III CLASSIFICATION:  <200     mg/dL   Desirable   200-239  mg/dL   Borderline High  >=240    mg/dL   High          Lab Results  Component Value Date   HDL 44.70 04/10/2020   HDL 39 (L) 04/25/2017   HDL 50 02/18/2009   Lab Results  Component Value Date   LDLCALC 64 04/10/2020   LDLCALC 52 04/25/2017   LDLCALC  02/18/2009    61        Total Cholesterol/HDL:CHD Risk Coronary Heart Disease Risk Table                     Men   Women  1/2 Average Risk   3.4   3.3  Average Risk       5.0   4.4  2 X Average Risk   9.6   7.1  3 X Average Risk  23.4   11.0        Use the calculated Patient Ratio above and the CHD Risk Table to determine the patient's CHD Risk.        ATP III CLASSIFICATION (LDL):  <100     mg/dL   Optimal  100-129  mg/dL   Near or Above                    Optimal  130-159  mg/dL   Borderline  160-189  mg/dL   High  >190     mg/dL   Very High   Lab Results  Component Value Date   TRIG 123.0 04/10/2020   TRIG 77 04/25/2017   TRIG 61 02/18/2009   Lab Results  Component Value Date   CHOLHDL 3 04/10/2020   CHOLHDL 2.7 04/25/2017   CHOLHDL 2.5 02/18/2009   No results found for: LDLDIRECT Lab Results  Component Value Date   CREATININE 1.14 04/10/2020   BUN 14 04/10/2020   NA 135 04/10/2020   K 4.1 04/10/2020   CL 99 04/10/2020   CO2 30 04/10/2020    The 10-year ASCVD risk score (Arnett DK, et al., 2019) is: 28.5%   Values used to calculate the score:     Age: 81 years     Sex: Male     Is Non-Hispanic African American: No     Diabetic: No     Tobacco smoker: No     Systolic Blood Pressure: 408 mmHg     Is BP treated: Yes     HDL Cholesterol: 44.7 mg/dL     Total Cholesterol: 134 mg/dL  I reviewed the patients updated PMH, FH, and SocHx.    Patient Active Problem List   Diagnosis Date Noted   Barretts esophagus 02/14/2020    Priority: High  Chronic kidney disease, stage 3 unspecified ([place]) 02/14/2020    Priority: High   Gastroparesis 02/14/2020    Priority: High   Osteoarthritis  02/14/2020    Priority: High   Mixed hyperlipidemia 02/14/2020    Priority: High   History of transient ischemic attack (TIA) vertebral artery: embolism or dissection 2018 04/25/2017    Priority: High   Essential hypertension 04/25/2017    Priority: High   Gastroesophageal reflux disease with esophagitis 02/14/2020    Priority: Medium    Major depression, recurrent, chronic ([place]) 02/14/2020    Priority: Medium    Migraine 02/14/2020    Priority: Medium    Personal history of colonic polyps 02/14/2020    Priority: Medium    Erectile dysfunction 02/14/2020    Priority: Low   BPH without obstruction/lower urinary tract symptoms 02/14/2020    Priority: Low   Occlusion and stenosis of left vertebral artery 04/25/2017    Allergies: Patient has no known allergies.  Social History: Patient  reports that he quit smoking about 44 years ago. His smoking use included cigarettes. He has never used smokeless tobacco. He reports current alcohol use of about 10.0 - 14.0 standard drinks per week. He reports that he does not use drugs.  Current Meds  Medication Sig   ALPRAZolam (XANAX) 0.25 MG tablet Take 0.25 mg by mouth as needed.   Armodafinil 250 MG tablet Take 250 mg by mouth daily.   atorvastatin (LIPITOR) 20 MG tablet Take 20 mg by mouth daily.    busPIRone (BUSPAR) 15 MG tablet Take 15 mg by mouth 2 (two) times daily.    butalbital-aspirin-caffeine (FIORINAL) 50-325-40 MG per capsule Take 1 capsule by mouth every 6 (six) hours as needed for headache. Headache   DULoxetine (CYMBALTA) 30 MG capsule Take 60 mg by mouth daily.   hydroxypropyl methylcellulose (ISOPTO TEARS) 2.5 % ophthalmic solution Place 1 drop into both eyes as needed. Dry eyes   lisinopril-hydrochlorothiazide (ZESTORETIC) 20-12.5 MG tablet Take 1 tablet by mouth daily.   metoprolol succinate (TOPROL-XL) 50 MG 24 hr tablet TAKE ONE TABLET BY MOUTH DAILY WITH OR IMMEDIATELY FOLLOWING A MEAL   omeprazole (PRILOSEC) 20 MG  capsule Take 20 mg by mouth daily.   ondansetron (ZOFRAN) 4 MG tablet Take 4 mg by mouth every 8 (eight) hours as needed for nausea or vomiting.   Sildenafil Citrate (VIAGRA PO) Take 100 mg by mouth.    Review of Systems: Cardiovascular: negative for chest pain, palpitations, leg swelling, orthopnea Respiratory: negative for SOB, wheezing or persistent cough Gastrointestinal: negative for abdominal pain Genitourinary: negative for dysuria or gross hematuria  Objective  Vitals: BP 130/79    Pulse 70    Temp 97.7 F (36.5 C) (Temporal)    Ht 5\' 8"  (1.727 m)    Wt 203 lb (92.1 kg)    SpO2 95%    BMI 30.87 kg/m  General: no acute distress, appears well Psych:  Alert and oriented, normal mood and affect HEENT:  Normocephalic, atraumatic, supple neck, no nystagmus Cardiovascular:  RRR without murmur. no edema Respiratory:  Good breath sounds bilaterally, CTAB with normal respiratory effort Skin:  Warm, no rashes Neurologic:   Mental status is normal, negative Hallpike  EKG: Normal sinus rhythm, first-degree AV block, new.  Possible old infarct anterior V1. Commons side effects, risks, benefits, and alternatives for medications and treatment plan prescribed today were discussed, and the patient expressed understanding of the given instructions. Patient is instructed to call or message via  MyChart if he/she has any questions or concerns regarding our treatment plan. No barriers to understanding were identified. We discussed Red Flag symptoms and signs in detail. Patient expressed understanding regarding what to do in case of urgent or emergency type symptoms.  Medication list was reconciled, printed and provided to the patient in AVS. Patient instructions and summary information was reviewed with the patient as documented in the AVS. This note was prepared with assistance of Dragon voice recognition software. Occasional wrong-word or sound-a-like substitutions may have occurred due to the inherent  limitations of voice recognition software  This visit occurred during the SARS-CoV-2 public health emergency.  Safety protocols were in place, including screening questions prior to the visit, additional usage of staff PPE, and extensive cleaning of exam room while observing appropriate contact time as indicated for disinfecting solutions.

## 2021-04-03 LAB — CBC WITH DIFFERENTIAL/PLATELET
Basophils Absolute: 0 10*3/uL (ref 0.0–0.1)
Basophils Relative: 0.6 % (ref 0.0–3.0)
Eosinophils Absolute: 0.2 10*3/uL (ref 0.0–0.7)
Eosinophils Relative: 3 % (ref 0.0–5.0)
HCT: 41.7 % (ref 39.0–52.0)
Hemoglobin: 13.9 g/dL (ref 13.0–17.0)
Lymphocytes Relative: 34.1 % (ref 12.0–46.0)
Lymphs Abs: 1.9 10*3/uL (ref 0.7–4.0)
MCHC: 33.2 g/dL (ref 30.0–36.0)
MCV: 96.2 fl (ref 78.0–100.0)
Monocytes Absolute: 0.6 10*3/uL (ref 0.1–1.0)
Monocytes Relative: 10.3 % (ref 3.0–12.0)
Neutro Abs: 2.8 10*3/uL (ref 1.4–7.7)
Neutrophils Relative %: 52 % (ref 43.0–77.0)
Platelets: 250 10*3/uL (ref 150.0–400.0)
RBC: 4.34 Mil/uL (ref 4.22–5.81)
RDW: 13.7 % (ref 11.5–15.5)
WBC: 5.4 10*3/uL (ref 4.0–10.5)

## 2021-04-03 LAB — LIPID PANEL
Cholesterol: 147 mg/dL (ref 0–200)
HDL: 44.4 mg/dL (ref >39.00)
LDL Cholesterol: 74 mg/dL (ref 0–99)
NonHDL: 102.59
Total CHOL/HDL Ratio: 3
Triglycerides: 145 mg/dL (ref 0.0–149.0)
VLDL: 29 mg/dL (ref 0.0–40.0)

## 2021-04-03 LAB — COMPREHENSIVE METABOLIC PANEL
ALT: 14 U/L (ref 0–53)
AST: 19 U/L (ref 0–37)
Albumin: 4 g/dL (ref 3.5–5.2)
Alkaline Phosphatase: 95 U/L (ref 39–117)
BUN: 17 mg/dL (ref 6–23)
CO2: 35 mEq/L — ABNORMAL HIGH (ref 19–32)
Calcium: 9.6 mg/dL (ref 8.4–10.5)
Chloride: 104 mEq/L (ref 96–112)
Creatinine, Ser: 1.11 mg/dL (ref 0.40–1.50)
GFR: 64.7 mL/min (ref >60.00)
Glucose, Bld: 72 mg/dL (ref 70–99)
Potassium: 4.1 mEq/L (ref 3.5–5.1)
Sodium: 141 mEq/L (ref 135–145)
Total Bilirubin: 0.5 mg/dL (ref 0.2–1.2)
Total Protein: 6.6 g/dL (ref 6.0–8.3)

## 2021-04-03 LAB — TSH: TSH: 2.19 u[IU]/mL (ref 0.35–5.50)

## 2021-04-11 NOTE — Progress Notes (Signed)
Cardiology Office Note   Date:  04/17/2021   ID:  Edward Zavala, DOB 11-28-1945, MRN [id number]  PCP:  Leamon Arnt, MD  Cardiologist:   Kaena Santori [place], MD   Chief Complaint  Patient presents with   Shortness of Breath        Dizziness      History of Present Illness: Edward Zavala is a 76 y.o. male who is seen at the request of Dr Jonni Sanger for evaluation of dyspnea on exertion. He has a history of HTN, HLD, and prior vertebral artery TIA. Echo in March 2019 showed mild LVH, otherwise normal. Myoview in 2020 was normal.   He reports that in early February he had some SOB usually after using his upper body. This was intermittent. No chest pain. Some periods of dizziness where he was almost stumbling. These symptoms have now resolved. He is very active. Enjoys traveling all over the world. Plans to go to [place] in June and [place] in November. He is a retired [employer] attorney.     Past Medical History:  Diagnosis Date   Anxiety    Depression    Dyslipidemia (high LDL; low HDL)    Gastroparesis    GERD (gastroesophageal reflux disease)    H/O acute pancreatitis    H/O gastroesophageal reflux (GERD)    Headache(784.0)    occasinal    Hyperlipidemia    Hypertension    Stroke [facility])     Past Surgical History:  Procedure Laterality Date   CHOLECYSTECTOMY     ESOPHAGOGASTRIC FUNDOPLICATION     ESOPHAGOGASTRODUODENOSCOPY (EGD) WITH PROPOFOL N/A 08/08/2019   Procedure: ESOPHAGOGASTRODUODENOSCOPY (EGD) WITH PROPOFOL;  Surgeon: Arta Silence, MD;  Location: [facility] ENDOSCOPY;  Service: Endoscopy;  Laterality: N/A;   EUS N/A 08/08/2019   Procedure: UPPER ENDOSCOPIC ULTRASOUND (EUS) LINEAR;  Surgeon: Arta Silence, MD;  Location: [facility] ENDOSCOPY;  Service: Endoscopy;  Laterality: N/A;  FNA   EYE SURGERY     age 73 months old    FINE NEEDLE ASPIRATION N/A 08/08/2019   Procedure: FINE NEEDLE ASPIRATION (FNA) LINEAR;  Surgeon: Arta Silence, MD;  Location: [facility] ENDOSCOPY;  Service:  Endoscopy;  Laterality: N/A;   HERNIA REPAIR     Port Site Hernia   LAPAROSCOPIC NISSEN FUNDOPLICATION     VENTRAL HERNIA REPAIR  12/01/2011   Procedure: LAPAROSCOPIC VENTRAL HERNIA;  Surgeon: Edward Jolly, MD;  Location: [facility] ORS;  Service: General;  Laterality: N/A;  x2  with mesh     Current Outpatient Medications  Medication Sig Dispense Refill   ALPRAZolam (XANAX) 0.25 MG tablet Take 0.25 mg by mouth as needed.     Armodafinil 250 MG tablet Take 250 mg by mouth daily.     atorvastatin (LIPITOR) 20 MG tablet Take 20 mg by mouth daily.      busPIRone (BUSPAR) 15 MG tablet Take 15 mg by mouth daily.     butalbital-aspirin-caffeine (FIORINAL) 50-325-40 MG per capsule Take 1 capsule by mouth every 6 (six) hours as needed for headache. Headache     DULoxetine (CYMBALTA) 30 MG capsule Take 60 mg by mouth daily.     hydroxypropyl methylcellulose (ISOPTO TEARS) 2.5 % ophthalmic solution Place 1 drop into both eyes as needed. Dry eyes     lisinopril-hydrochlorothiazide (ZESTORETIC) 20-12.5 MG tablet TAKE ONE TABLET BY MOUTH DAILY 90 tablet 3   metoprolol succinate (TOPROL-XL) 50 MG 24 hr tablet TAKE ONE TABLET BY MOUTH DAILY WITH OR IMMEDIATELY FOLLOWING A MEAL 90  tablet 3   omeprazole (PRILOSEC) 20 MG capsule Take 20 mg by mouth daily.     ondansetron (ZOFRAN) 4 MG tablet Take 4 mg by mouth every 8 (eight) hours as needed for nausea or vomiting.     Sildenafil Citrate (VIAGRA PO) Take 100 mg by mouth.     No current facility-administered medications for this visit.    Allergies:   Patient has no known allergies.    Social History:  The patient  reports that he quit smoking about 44 years ago. His smoking use included cigarettes. He has never used smokeless tobacco. He reports current alcohol use of about 10.0 - 14.0 standard drinks per week. He reports that he does not use drugs.   Family History:  The patient's family history includes Alcohol abuse in his father; Asthma in his son;  Cancer (age of onset: 60) in his mother; Depression in his daughter, mother, and son; Early death in his maternal grandmother; Heart attack in his maternal grandmother; High blood pressure in his daughter and mother.    ROS:  Please see the history of present illness.   Otherwise, review of systems are positive for none.   All other systems are reviewed and negative.    PHYSICAL EXAM: VS:  BP 130/80 (BP Location: Left Arm, Patient Position: Sitting, Cuff Size: Normal)    Pulse 79    Resp 20    Ht 5\' 8"  (1.727 m)    Wt 207 lb (93.9 kg)    SpO2 96%    BMI 31.47 kg/m  , BMI Body mass index is 31.47 kg/m. GEN: Well nourished, well developed, in no acute distress HEENT: normal Neck: no JVD, carotid bruits, or masses Cardiac: RRR; no murmurs, rubs, or gallops,no edema  Respiratory:  clear to auscultation bilaterally, normal work of breathing GI: soft, nontender, nondistended, + BS MS: no deformity or atrophy Skin: warm and dry, no rash Neuro:  Strength and sensation are intact Psych: euthymic mood, full affect   EKG:  EKG is not ordered today. The ekg ordered 04/02/21 demonstrates NSR with PACs. Rate 61. Otherwise normal. I have personally reviewed and interpreted this study.    Recent Labs: 04/02/2021: ALT 14; BUN 17; Creatinine, Ser 1.11; Hemoglobin 13.9; Platelets 250.0; Potassium 4.1; Sodium 141; TSH 2.19    Lipid Panel    Component Value Date/Time   CHOL 147 04/02/2021 1647   TRIG 145.0 04/02/2021 1647   HDL 44.40 04/02/2021 1647   CHOLHDL 3 04/02/2021 1647   VLDL 29.0 04/02/2021 1647   LDLCALC 74 04/02/2021 1647      Wt Readings from Last 3 Encounters:  04/17/21 207 lb (93.9 kg)  04/02/21 203 lb (92.1 kg)  12/18/20 196 lb 3.4 oz (89 kg)      Other studies Reviewed: Additional studies/ records that were reviewed today include:   Echo 04/25/17: Study Conclusions   - Left ventricle: The cavity size was normal. Wall thickness was    increased in a pattern of mild LVH.  Systolic function was normal.    The estimated ejection fraction was in the range of 60% to 65%.    Wall motion was normal; there were no regional wall motion    abnormalities. Doppler parameters are consistent with abnormal    left ventricular relaxation (grade 1 diastolic dysfunction).    Myoview 08/03/18: MYOCARDIAL IMAGING WITH SPECT (REST AND PHARMACOLOGIC-STRESS)   GATED LEFT VENTRICULAR WALL MOTION STUDY   LEFT VENTRICULAR EJECTION FRACTION   TECHNIQUE: Standard  myocardial SPECT imaging was performed after resting intravenous injection of 10 mCi Tc-81m tetrofosmin. Subsequently, intravenous infusion of Lexiscan was performed under the supervision of the Cardiology staff. At peak effect of the drug, 30 mCi Tc-70m tetrofosmin was injected intravenously and standard myocardial SPECT imaging was performed. Quantitative gated imaging was also performed to evaluate left ventricular wall motion, and estimate left ventricular ejection fraction.   COMPARISON:  Chest radiographs 04/25/2017.   FINDINGS: Perfusion: Mildly decreased activity in the apex and distal septum appears fixed, without associated wall motion abnormality, and may reflect thinning. There are no reversible perfusion defects.   Wall Motion: Normal left ventricular wall motion. No left ventricular dilation.   Left Ventricular Ejection Fraction: 68 %   End diastolic volume 67 ml   End systolic volume 22 ml   IMPRESSION: 1. No reversible ischemia or infarction.   2. Normal left ventricular wall motion.   3. Left ventricular ejection fraction 68%   4. Non invasive risk stratification*: Low   *2012 Appropriate Use Criteria for Coronary Revascularization Focused Update: J Am Coll Cardiol. 9702;63(7):858-850. http://content.airportbarriers.com.aspx?articleid=1201161     Electronically Signed   By: Richardean Sale M.D.   On: 08/03/2018 15:13   ASSESSMENT AND PLAN:  1.  DOE. Intermittent over about  a 2 week period. Reports symptoms have now resolved. Prior evaluation noted with Echo and Myoview- 2020 were unremarkable. He does have risk factors of HTN and HLD. Overall I feel his risk is low based on prior evaluation. Since his symptoms have resolved he is not very concerned about it. He is on appropriate therapy for his BP and HLD. For now will monitor his symptoms. If they recur I would recommend ischemic evaluation with coronary CTA. Will call if any new issues 2. HTN well controlled on lisinopril HCT and Toprol 3. HLD. On statin.  4. History of TIA 5. CKD stage 2.    Current medicines are reviewed at length with the patient today.  The patient does not have concerns regarding medicines.  The following changes have been made:  no change  Labs/ tests ordered today include:  No orders of the defined types were placed in this encounter.        Disposition:   FU with me PRN  Signed, Lagina Reader Martinique, MD  04/17/2021 12:50 PM    [facility] [address], [place], [place], [address] Phone [phone], Fax [phone]

## 2021-04-13 ENCOUNTER — Other Ambulatory Visit: Payer: Self-pay | Admitting: Family Medicine

## 2021-04-15 DIAGNOSIS — F411 Generalized anxiety disorder: Secondary | ICD-10-CM | POA: Diagnosis not present

## 2021-04-15 DIAGNOSIS — F330 Major depressive disorder, recurrent, mild: Secondary | ICD-10-CM | POA: Diagnosis not present

## 2021-04-15 DIAGNOSIS — F909 Attention-deficit hyperactivity disorder, unspecified type: Secondary | ICD-10-CM | POA: Diagnosis not present

## 2021-04-17 ENCOUNTER — Ambulatory Visit: Payer: Medicare PPO | Admitting: Cardiology

## 2021-04-17 ENCOUNTER — Other Ambulatory Visit: Payer: Self-pay

## 2021-04-17 ENCOUNTER — Encounter: Payer: Self-pay | Admitting: Cardiology

## 2021-04-17 VITALS — BP 130/80 | HR 79 | Resp 20 | Ht 68.0 in | Wt 207.0 lb

## 2021-04-17 DIAGNOSIS — R0609 Other forms of dyspnea: Secondary | ICD-10-CM

## 2021-04-17 DIAGNOSIS — I10 Essential (primary) hypertension: Secondary | ICD-10-CM

## 2021-04-17 DIAGNOSIS — E7800 Pure hypercholesterolemia, unspecified: Secondary | ICD-10-CM

## 2021-04-28 ENCOUNTER — Telehealth: Payer: Self-pay | Admitting: Family Medicine

## 2021-04-28 NOTE — Telephone Encounter (Signed)
.. ?  Encourage patient to contact the pharmacy for refills or they can request refills through [facility] ? ?LAST APPOINTMENT DATE:  04/02/21 ? ?NEXT APPOINTMENT DATE: 5/31 - [facility]  ? ?MEDICATION:atorvastatin (LIPITOR) 20 MG tablet ? ?Is the patient out of medication? yes ? ?[facility]: [facility]-  ? ?Let patient know to contact pharmacy at the end of the day to make sure medication is ready. ? ?Please notify patient to allow 48-72 hours to process  ?

## 2021-04-29 ENCOUNTER — Other Ambulatory Visit: Payer: Self-pay

## 2021-04-29 DIAGNOSIS — F411 Generalized anxiety disorder: Secondary | ICD-10-CM | POA: Diagnosis not present

## 2021-04-29 DIAGNOSIS — E782 Mixed hyperlipidemia: Secondary | ICD-10-CM

## 2021-04-29 DIAGNOSIS — F330 Major depressive disorder, recurrent, mild: Secondary | ICD-10-CM | POA: Diagnosis not present

## 2021-04-29 DIAGNOSIS — F909 Attention-deficit hyperactivity disorder, unspecified type: Secondary | ICD-10-CM | POA: Diagnosis not present

## 2021-04-29 MED ORDER — ATORVASTATIN CALCIUM 20 MG PO TABS: 20.0000 mg | ORAL_TABLET | Freq: Every day | ORAL | 3 refills | Status: DC

## 2021-04-29 NOTE — Telephone Encounter (Signed)
Rx sent to Pharmacy

## 2021-05-02 DIAGNOSIS — F411 Generalized anxiety disorder: Secondary | ICD-10-CM | POA: Diagnosis not present

## 2021-05-02 DIAGNOSIS — F3341 Major depressive disorder, recurrent, in partial remission: Secondary | ICD-10-CM | POA: Diagnosis not present

## 2021-05-02 DIAGNOSIS — F909 Attention-deficit hyperactivity disorder, unspecified type: Secondary | ICD-10-CM | POA: Diagnosis not present

## 2021-05-05 DIAGNOSIS — Z20822 Contact with and (suspected) exposure to covid-19: Secondary | ICD-10-CM | POA: Diagnosis not present

## 2021-05-19 DIAGNOSIS — Z20822 Contact with and (suspected) exposure to covid-19: Secondary | ICD-10-CM | POA: Diagnosis not present

## 2021-06-03 DIAGNOSIS — F411 Generalized anxiety disorder: Secondary | ICD-10-CM | POA: Diagnosis not present

## 2021-06-03 DIAGNOSIS — F3341 Major depressive disorder, recurrent, in partial remission: Secondary | ICD-10-CM | POA: Diagnosis not present

## 2021-06-03 DIAGNOSIS — F909 Attention-deficit hyperactivity disorder, unspecified type: Secondary | ICD-10-CM | POA: Diagnosis not present

## 2021-06-09 DIAGNOSIS — F330 Major depressive disorder, recurrent, mild: Secondary | ICD-10-CM | POA: Diagnosis not present

## 2021-06-17 DIAGNOSIS — F330 Major depressive disorder, recurrent, mild: Secondary | ICD-10-CM | POA: Diagnosis not present

## 2021-07-01 DIAGNOSIS — F330 Major depressive disorder, recurrent, mild: Secondary | ICD-10-CM | POA: Diagnosis not present

## 2021-07-16 ENCOUNTER — Ambulatory Visit (INDEPENDENT_AMBULATORY_CARE_PROVIDER_SITE_OTHER): Payer: Medicare PPO | Admitting: Family Medicine

## 2021-07-16 ENCOUNTER — Encounter: Payer: Self-pay | Admitting: Family Medicine

## 2021-07-16 VITALS — BP 126/84 | HR 74 | Temp 98.1°F | Ht 68.0 in | Wt 200.2 lb

## 2021-07-16 DIAGNOSIS — E782 Mixed hyperlipidemia: Secondary | ICD-10-CM | POA: Diagnosis not present

## 2021-07-16 DIAGNOSIS — Z0000 Encounter for general adult medical examination without abnormal findings: Secondary | ICD-10-CM | POA: Diagnosis not present

## 2021-07-16 DIAGNOSIS — K2270 Barrett's esophagus without dysplasia: Secondary | ICD-10-CM | POA: Diagnosis not present

## 2021-07-16 DIAGNOSIS — R0609 Other forms of dyspnea: Secondary | ICD-10-CM

## 2021-07-16 DIAGNOSIS — F339 Major depressive disorder, recurrent, unspecified: Secondary | ICD-10-CM | POA: Diagnosis not present

## 2021-07-16 DIAGNOSIS — I10 Essential (primary) hypertension: Secondary | ICD-10-CM | POA: Diagnosis not present

## 2021-07-16 DIAGNOSIS — Z8673 Personal history of transient ischemic attack (TIA), and cerebral infarction without residual deficits: Secondary | ICD-10-CM

## 2021-07-16 NOTE — Progress Notes (Signed)
Subjective  Chief Complaint  Patient presents with   Annual Exam    Pt here fro Annual Exam and is not fasting. Pt has been having some pain in his Lt hip on/off for the past 4/27mo. No OTC for the pain. Also would like to discuss some issues on his chart.    HPI: Edward OYSTERis a 76y.o. [place] [facility] [place] [place] [place] [facility]. He also has the concerns and/or needs as listed above in the chief complaint. These will be addressed in addition to the Health Maintenance Visit.   Wellness Visit: annual visit with health maintenance review and exam   Health maintenance: Screens are current.  Continues to be active.  Immunizations are up-to-date.  Body mass index is 30.44 kg/m. Wt Readings from Last 3 Encounters:  07/16/21 200 lb 3.2 oz (90.8 kg)  04/17/21 207 lb (93.9 kg)  04/02/21 203 lb (92.1 kg)     Chronic disease management visit and/or acute problem visit: Inquires about weight loss medication, [id number]  Has had trouble losing weight.  Has been on Cymbalta for about 2 years.  Tries to eat healthy.  Would like to lose 10 to 15 pounds.  Reports diet is fairly healthy Dyspnea on exertion: Reviewed cardiology consult.  At that time it was stated that his symptoms had resolved, however patient denies this.  He reports that it continues to happen.  He ran after his dog was chasing a squirrel last week and had significant severe shortness of breath, lightheadedness requiring him to follow his knees until he got his breath back he denies heaviness in the chest or chest pain.  No palpitations.  No lower extremity edema.  He did have a negative cardiac stress test in 2020.  He also reports that he gets very winded when he is lifting heavy things.  This is a significant change from his past.  He is always been very physically active.  He denies wheezing or cough. Major depression stable on Cymbalta. He questions the diagnosis of CKD.  He did have  1 GFR in the 50s.  However most recent numbers are normal. He remains on his statin due to his history of TIA.  Well-tolerated  Patient Active Problem List   Diagnosis Date Noted   Barretts esophagus 02/14/2020   Gastroparesis 02/14/2020   Osteoarthritis 02/14/2020   History of transient ischemic attack (TIA) vertebral artery: embolism or dissection 2018 04/25/2017   Essential hypertension 04/25/2017   Gastroesophageal reflux disease with esophagitis 02/14/2020   Major depression, recurrent, chronic (HCC) 02/14/2020   Migraine 02/14/2020   Personal history of colonic polyps 02/14/2020   Erectile dysfunction 02/14/2020   BPH without obstruction/lower urinary tract symptoms 02/14/2020   Occlusion and stenosis of left vertebral artery 04/25/2017   Health Maintenance  Topic Date Due   INFLUENZA VACCINE  09/16/2021   TETANUS/TDAP  12/23/2024   Pneumonia Vaccine 76 Years old  Completed   COVID-19 Vaccine  Completed   Zoster Vaccines- Shingrix  Completed   HPV VACCINES  Aged Out   COLONOSCOPY (Pts 45-447yrInsurance coverage will need to be confirmed)  Discontinued   Hepatitis C Screening  Discontinued   Immunization History  Administered Date(s) Administered   Fluad Quad(high Dose 65+) 12/04/2020   Influenza Split 12/02/2011, 10/26/2014, 12/17/2016   Influenza, High Dose Seasonal PF 01/21/2016, 12/01/2017, 10/31/2019   Influenza,inj,Quad PF,6+ Mos 10/27/2018   PFIZER(Purple Top)SARS-COV-2 Vaccination 03/13/2019, 04/03/2019, 11/07/2019,  07/18/2020   Pfizer Covid-19 Vaccine Bivalent Booster 69yr & up 12/04/2020   Pneumococcal Conjugate-13 09/27/2013   Pneumococcal Polysaccharide-23 09/24/2011   Td 10/18/2003, 12/24/2014   Zoster Recombinat (Shingrix) 12/01/2017, 02/01/2018   Zoster, Live 01/19/2006   We updated and reviewed the patient's past history in detail and it is documented below. Allergies: Patient has No Known Allergies. Past Medical History  has a past medical  history of Anxiety, Depression, Dyslipidemia (high LDL; low HDL), Gastroparesis, GERD (gastroesophageal reflux disease), H/O acute pancreatitis, H/O gastroesophageal reflux (GERD), Headache(784.0), Hyperlipidemia, Hypertension, and Stroke ([facility]. Past Surgical History Patient  has a past surgical history that includes Cholecystectomy; Laparoscopic Nissen fundoplication; Hernia repair; Eye surgery; Ventral hernia repair (12/01/2011); EUS (N/A, 08/08/2019); Fine needle aspiration (N/A, 08/08/2019); Esophagogastroduodenoscopy (egd) with propofol (N/A, 08/08/2019); and Esophagogastric fundoplication. Social History Patient  reports that he quit smoking about 44 years ago. His smoking use included cigarettes. He has never used smokeless tobacco. He reports current alcohol use of about 10.0 - 14.0 standard drinks per week. He reports that he does not use drugs. Family History family history includes Alcohol abuse in his father; Asthma in his son; Cancer (age of onset: 876 in his mother; Depression in his daughter, mother, and son; Early death in his maternal grandmother; Heart attack in his maternal grandmother; High blood pressure in his daughter and mother. Review of Systems: Constitutional: negative for fever or malaise Ophthalmic: negative for photophobia, double vision or loss of vision Cardiovascular: negative for chest pain, dyspnea on exertion, or new LE swelling Respiratory: negative for SOB or persistent cough Gastrointestinal: negative for abdominal pain, change in bowel habits or melena Genitourinary: negative for dysuria or gross hematuria Musculoskeletal: negative for new gait disturbance or muscular weakness Integumentary: negative for new or persistent rashes Neurological: negative for TIA or stroke symptoms Psychiatric: negative for SI or delusions Allergic/Immunologic: negative for hives  Patient Care Team    Relationship Specialty Notifications Start End  ALeamon Arnt MD PCP -  General Family Medicine  02/14/20   SGarvin Fila MD Consulting Physician Neurology  02/14/20   MClarene Essex MD Consulting Physician Gastroenterology  02/14/20   LKaty Apo MD Consulting Physician Ophthalmology  02/14/20   HDevra Dopp MD Referring Physician Dermatology  02/14/20   BLucas Mallow MD Consulting Physician Urology  02/14/20    Objective  Vitals: BP 126/84 Comment: Lt arm  Pulse 74   Temp 98.1 F (36.7 C)   Ht '5\' 8"'$  (1.727 m)   Wt 200 lb 3.2 oz (90.8 kg)   SpO2 98%   BMI 30.44 kg/m  General:  Well developed, well nourished, no acute distress  Psych:  Alert and orientedx3,normal mood and affect HEENT:  Normocephalic, atraumatic, non-icteric sclera, PERRL, oropharynx is clear without mass or exudate, supple neck without adenopathy, mass or thyromegaly Cardiovascular:  Normal S1, S2, RRR without gallop, rub or murmur, nondisplaced PMI, +2 distal pulses in bilateral upper and lower extremities. Respiratory:  Good breath sounds bilaterally, CTAB with normal respiratory effort Gastrointestinal: normal bowel sounds, soft, non-tender, no noted masses. No HSM MSK: no deformities, contusions. Joints are without erythema or swelling. Spine and CVA region are nontender Skin:  Warm, no rashes or suspicious lesions noted Neurologic:    Mental status is normal. CN 2-11 are normal. Gross motor and sensory exams are normal. Stable gait. No tremor GU: No inguinal hernias or adenopathy are appreciated bilaterally   Assessment  1. Annual physical exam   2.  Essential hypertension   3. Major depression, recurrent, chronic (HCC) Chronic  4. History of transient ischemic attack (TIA) vertebral artery: embolism or dissection 2018   5. Barrett's esophagus without dysplasia   6. Mixed hyperlipidemia   7. Dyspnea on exertion      Plan  Male Wellness Visit: Age appropriate Health Maintenance and Prevention measures were discussed with patient. Included topics are  cancer screening recommendations, ways to keep healthy (see AVS) including dietary and exercise recommendations, regular eye and dental care, use of seat belts, and avoidance of moderate alcohol use and tobacco use.  BMI: discussed patient's BMI and encouraged positive lifestyle modifications to help get to or maintain a target BMI. HM needs and immunizations were addressed and ordered. See below for orders. See HM and immunization section for updates. Routine labs and screening tests ordered including cmp, cbc and lipids where appropriate. Discussed recommendations regarding Vit D and calcium supplementation (see AVS)  Chronic disease f/u and/or acute problem visit: (deemed necessary to be done in addition to the wellness visit): Dyspnea on exertion: Patient remains very concerned.  Atypical for cardiac disease but need to rule it out.  Stress test ordered.  Could also be deconditioning, age-related, history of large hiatal hernia causing shortness of breath, etc.  Rule out cardiac disease and then follow-up from there.  Patient agrees. Hypertension is fairly well controlled.  Patient to start checking at home.  May need adjustment of antihypertensives.  Renal function and electrolytes are normal Continues atorvastatin 20 mg nightly for history of TIA.  LDL is at goal Barrett's esophagus followed by GI.  Continues on omeprazole daily. Major depression controlled with Cymbalta.  To discuss with psychiatry other agents given that Cymbalta may be contributing to weight gain.  He will check with his insurance to see if Mancel Parsons is a covered benefit  Follow up: 6 months to recheck blood pressure Commons side effects, risks, benefits, and alternatives for medications and treatment plan prescribed today were discussed, and the patient expressed understanding of the given instructions. Patient is instructed to call or message via MyChart if he/she has any questions or concerns regarding our treatment plan. No  barriers to understanding were identified. We discussed Red Flag symptoms and signs in detail. Patient expressed understanding regarding what to do in case of urgent or emergency type symptoms.  Medication list was reconciled, printed and provided to the patient in AVS. Patient instructions and summary information was reviewed with the patient as documented in the AVS. This note was prepared with assistance of Dragon voice recognition software. Occasional wrong-word or sound-a-like substitutions may have occurred due to the inherent limitations of voice recognition software  This visit occurred during the SARS-CoV-2 public health emergency.  Safety protocols were in place, including screening questions prior to the visit, additional usage of staff PPE, and extensive cleaning of exam room while observing appropriate contact time as indicated for disinfecting solutions.   Orders Placed This Encounter  Procedures   Cardiac Stress Test: Informed Consent Details: Physician/Practitioner Attestation; Transcribe to consent form and obtain patient signature   Myocardial Perfusion Imaging   No orders of the defined types were placed in this encounter.

## 2021-07-16 NOTE — Patient Instructions (Signed)
Please return in 6 months for hypertension follow up.   Please check your blood pressures at home; we would ideally like it to be < 130/80 consistently.   I will release your lab results to you on your MyChart account with further instructions. You may see the results before I do, but when I review them I will send you a message with my report or have my assistant call you if things need to be discussed. Please reply to my message with any questions. Thank you!   We will call you to get you set up for a cardiac stress test.   If you have any questions or concerns, please don't hesitate to send me a message via MyChart or call the office at [phone]. Thank you for visiting with [place] today! It's our pleasure caring for you.

## 2021-07-18 DIAGNOSIS — B351 Tinea unguium: Secondary | ICD-10-CM | POA: Diagnosis not present

## 2021-07-18 DIAGNOSIS — M19071 Primary osteoarthritis, right ankle and foot: Secondary | ICD-10-CM | POA: Diagnosis not present

## 2021-07-18 DIAGNOSIS — F330 Major depressive disorder, recurrent, mild: Secondary | ICD-10-CM | POA: Diagnosis not present

## 2021-07-18 DIAGNOSIS — M792 Neuralgia and neuritis, unspecified: Secondary | ICD-10-CM | POA: Diagnosis not present

## 2021-07-18 DIAGNOSIS — L603 Nail dystrophy: Secondary | ICD-10-CM | POA: Diagnosis not present

## 2021-07-18 DIAGNOSIS — M19072 Primary osteoarthritis, left ankle and foot: Secondary | ICD-10-CM | POA: Diagnosis not present

## 2021-07-22 DIAGNOSIS — M25552 Pain in left hip: Secondary | ICD-10-CM | POA: Diagnosis not present

## 2021-07-24 DIAGNOSIS — L814 Other melanin hyperpigmentation: Secondary | ICD-10-CM | POA: Diagnosis not present

## 2021-07-24 DIAGNOSIS — L570 Actinic keratosis: Secondary | ICD-10-CM | POA: Diagnosis not present

## 2021-07-24 DIAGNOSIS — Z85828 Personal history of other malignant neoplasm of skin: Secondary | ICD-10-CM | POA: Diagnosis not present

## 2021-07-24 DIAGNOSIS — B356 Tinea cruris: Secondary | ICD-10-CM | POA: Diagnosis not present

## 2021-07-24 DIAGNOSIS — D225 Melanocytic nevi of trunk: Secondary | ICD-10-CM | POA: Diagnosis not present

## 2021-07-24 DIAGNOSIS — D1801 Hemangioma of skin and subcutaneous tissue: Secondary | ICD-10-CM | POA: Diagnosis not present

## 2021-07-24 DIAGNOSIS — L821 Other seborrheic keratosis: Secondary | ICD-10-CM | POA: Diagnosis not present

## 2021-07-24 DIAGNOSIS — B351 Tinea unguium: Secondary | ICD-10-CM | POA: Diagnosis not present

## 2021-07-25 ENCOUNTER — Encounter: Payer: Self-pay | Admitting: Family Medicine

## 2021-07-30 DIAGNOSIS — F330 Major depressive disorder, recurrent, mild: Secondary | ICD-10-CM | POA: Diagnosis not present

## 2021-07-31 ENCOUNTER — Telehealth: Payer: Self-pay | Admitting: Family Medicine

## 2021-07-31 NOTE — Telephone Encounter (Signed)
Patient in regards to being told that a cardiac stress test would be ordered following his physical with Dr. Jonni Sanger on 07/16/21. According to the AVS pt was meant to be called by [place] to be scheduled for this. He is set to leave for [place] in late July and would rather have this done prior to then. Pt would like to be Mychart messaged in regards to this or called at [phone].

## 2021-08-04 ENCOUNTER — Other Ambulatory Visit: Payer: Self-pay

## 2021-08-04 DIAGNOSIS — F902 Attention-deficit hyperactivity disorder, combined type: Secondary | ICD-10-CM | POA: Diagnosis not present

## 2021-08-04 DIAGNOSIS — R0609 Other forms of dyspnea: Secondary | ICD-10-CM

## 2021-08-04 DIAGNOSIS — F411 Generalized anxiety disorder: Secondary | ICD-10-CM | POA: Diagnosis not present

## 2021-08-04 DIAGNOSIS — F3341 Major depressive disorder, recurrent, in partial remission: Secondary | ICD-10-CM | POA: Diagnosis not present

## 2021-08-07 ENCOUNTER — Encounter: Payer: Self-pay | Admitting: Family Medicine

## 2021-08-07 NOTE — Telephone Encounter (Signed)
Lattie Haw,  Can you please reach out to him to let him know what to expect regarding scheduling of this? Dr. Jonni Sanger

## 2021-08-12 ENCOUNTER — Encounter (HOSPITAL_COMMUNITY): Payer: Self-pay | Admitting: *Deleted

## 2021-08-12 DIAGNOSIS — F902 Attention-deficit hyperactivity disorder, combined type: Secondary | ICD-10-CM | POA: Diagnosis not present

## 2021-08-12 DIAGNOSIS — F3341 Major depressive disorder, recurrent, in partial remission: Secondary | ICD-10-CM | POA: Diagnosis not present

## 2021-08-12 DIAGNOSIS — F411 Generalized anxiety disorder: Secondary | ICD-10-CM | POA: Diagnosis not present

## 2021-08-13 ENCOUNTER — Ambulatory Visit (HOSPITAL_COMMUNITY): Payer: Medicare PPO | Attending: Family Medicine

## 2021-08-13 DIAGNOSIS — R0609 Other forms of dyspnea: Secondary | ICD-10-CM | POA: Insufficient documentation

## 2021-08-13 LAB — MYOCARDIAL PERFUSION IMAGING
LV dias vol: 60 mL (ref 62–150)
LV sys vol: 19 mL
Nuc Stress EF: 68 %
Peak HR: 121 {beats}/min
Rest HR: 68 {beats}/min
Rest Nuclear Isotope Dose: 10.9 mCi
SDS: 0
SRS: 0
SSS: 0
ST Depression (mm): 0 mm
Stress Nuclear Isotope Dose: 31.6 mCi
TID: 0.8

## 2021-08-13 MED ORDER — REGADENOSON 0.4 MG/5ML IV SOLN
0.4000 mg | Freq: Once | INTRAVENOUS | Status: AC
  Administered 2021-08-13: 0.4 mg via INTRAVENOUS

## 2021-08-13 MED ORDER — TECHNETIUM TC 99M TETROFOSMIN IV KIT
10.9000 | PACK | Freq: Once | INTRAVENOUS | Status: AC | PRN
  Administered 2021-08-13: 10.9 via INTRAVENOUS

## 2021-08-13 MED ORDER — TECHNETIUM TC 99M TETROFOSMIN IV KIT
31.6000 | PACK | Freq: Once | INTRAVENOUS | Status: AC | PRN
  Administered 2021-08-13: 31.6 via INTRAVENOUS

## 2021-08-13 NOTE — Progress Notes (Signed)
Please call patient: I have reviewed his/her heart test results. The heart test is normal as it was in 2020. There is no sign of cardiovascular disease.

## 2021-08-28 DIAGNOSIS — F3341 Major depressive disorder, recurrent, in partial remission: Secondary | ICD-10-CM | POA: Diagnosis not present

## 2021-08-28 DIAGNOSIS — F902 Attention-deficit hyperactivity disorder, combined type: Secondary | ICD-10-CM | POA: Diagnosis not present

## 2021-08-28 DIAGNOSIS — F411 Generalized anxiety disorder: Secondary | ICD-10-CM | POA: Diagnosis not present

## 2021-08-29 DIAGNOSIS — R7302 Impaired glucose tolerance (oral): Secondary | ICD-10-CM | POA: Diagnosis not present

## 2021-08-29 DIAGNOSIS — I10 Essential (primary) hypertension: Secondary | ICD-10-CM | POA: Diagnosis not present

## 2021-08-29 DIAGNOSIS — E785 Hyperlipidemia, unspecified: Secondary | ICD-10-CM | POA: Diagnosis not present

## 2021-08-29 DIAGNOSIS — F331 Major depressive disorder, recurrent, moderate: Secondary | ICD-10-CM | POA: Diagnosis not present

## 2021-08-29 DIAGNOSIS — K219 Gastro-esophageal reflux disease without esophagitis: Secondary | ICD-10-CM | POA: Diagnosis not present

## 2021-09-05 DIAGNOSIS — F411 Generalized anxiety disorder: Secondary | ICD-10-CM | POA: Diagnosis not present

## 2021-09-05 DIAGNOSIS — F330 Major depressive disorder, recurrent, mild: Secondary | ICD-10-CM | POA: Diagnosis not present

## 2021-09-08 DIAGNOSIS — F902 Attention-deficit hyperactivity disorder, combined type: Secondary | ICD-10-CM | POA: Diagnosis not present

## 2021-09-08 DIAGNOSIS — F411 Generalized anxiety disorder: Secondary | ICD-10-CM | POA: Diagnosis not present

## 2021-09-08 DIAGNOSIS — F3341 Major depressive disorder, recurrent, in partial remission: Secondary | ICD-10-CM | POA: Diagnosis not present

## 2021-09-08 NOTE — Progress Notes (Signed)
This encounter was created in error - please disregard. Left voice message and will need to be rescheduled

## 2021-09-22 DIAGNOSIS — F411 Generalized anxiety disorder: Secondary | ICD-10-CM | POA: Diagnosis not present

## 2021-09-22 DIAGNOSIS — F3341 Major depressive disorder, recurrent, in partial remission: Secondary | ICD-10-CM | POA: Diagnosis not present

## 2021-09-22 DIAGNOSIS — F902 Attention-deficit hyperactivity disorder, combined type: Secondary | ICD-10-CM | POA: Diagnosis not present

## 2021-09-29 DIAGNOSIS — M545 Low back pain, unspecified: Secondary | ICD-10-CM | POA: Diagnosis not present

## 2021-09-29 DIAGNOSIS — F331 Major depressive disorder, recurrent, moderate: Secondary | ICD-10-CM | POA: Diagnosis not present

## 2021-09-29 DIAGNOSIS — G4700 Insomnia, unspecified: Secondary | ICD-10-CM | POA: Diagnosis not present

## 2021-09-29 DIAGNOSIS — I10 Essential (primary) hypertension: Secondary | ICD-10-CM | POA: Diagnosis not present

## 2021-09-29 DIAGNOSIS — Z0000 Encounter for general adult medical examination without abnormal findings: Secondary | ICD-10-CM | POA: Diagnosis not present

## 2021-09-29 DIAGNOSIS — E785 Hyperlipidemia, unspecified: Secondary | ICD-10-CM | POA: Diagnosis not present

## 2021-09-29 DIAGNOSIS — K219 Gastro-esophageal reflux disease without esophagitis: Secondary | ICD-10-CM | POA: Diagnosis not present

## 2021-09-29 DIAGNOSIS — R0609 Other forms of dyspnea: Secondary | ICD-10-CM | POA: Diagnosis not present

## 2021-09-29 DIAGNOSIS — J449 Chronic obstructive pulmonary disease, unspecified: Secondary | ICD-10-CM | POA: Diagnosis not present

## 2021-10-08 DIAGNOSIS — G4733 Obstructive sleep apnea (adult) (pediatric): Secondary | ICD-10-CM | POA: Diagnosis not present

## 2021-10-22 DIAGNOSIS — F902 Attention-deficit hyperactivity disorder, combined type: Secondary | ICD-10-CM | POA: Diagnosis not present

## 2021-10-22 DIAGNOSIS — F411 Generalized anxiety disorder: Secondary | ICD-10-CM | POA: Diagnosis not present

## 2021-10-22 DIAGNOSIS — F3341 Major depressive disorder, recurrent, in partial remission: Secondary | ICD-10-CM | POA: Diagnosis not present

## 2021-10-29 DIAGNOSIS — R400 Somnolence: Secondary | ICD-10-CM | POA: Diagnosis not present

## 2021-10-29 DIAGNOSIS — I10 Essential (primary) hypertension: Secondary | ICD-10-CM | POA: Diagnosis not present

## 2021-10-29 DIAGNOSIS — G4733 Obstructive sleep apnea (adult) (pediatric): Secondary | ICD-10-CM | POA: Diagnosis not present

## 2021-10-30 DIAGNOSIS — H40011 Open angle with borderline findings, low risk, right eye: Secondary | ICD-10-CM | POA: Diagnosis not present

## 2021-10-30 DIAGNOSIS — H5201 Hypermetropia, right eye: Secondary | ICD-10-CM | POA: Diagnosis not present

## 2021-10-30 DIAGNOSIS — H2513 Age-related nuclear cataract, bilateral: Secondary | ICD-10-CM | POA: Diagnosis not present

## 2021-11-03 DIAGNOSIS — E785 Hyperlipidemia, unspecified: Secondary | ICD-10-CM | POA: Diagnosis not present

## 2021-11-03 DIAGNOSIS — Z23 Encounter for immunization: Secondary | ICD-10-CM | POA: Diagnosis not present

## 2021-11-03 DIAGNOSIS — G4733 Obstructive sleep apnea (adult) (pediatric): Secondary | ICD-10-CM | POA: Diagnosis not present

## 2021-11-03 DIAGNOSIS — Z0001 Encounter for general adult medical examination with abnormal findings: Secondary | ICD-10-CM | POA: Diagnosis not present

## 2021-11-03 DIAGNOSIS — K219 Gastro-esophageal reflux disease without esophagitis: Secondary | ICD-10-CM | POA: Diagnosis not present

## 2021-11-03 DIAGNOSIS — E559 Vitamin D deficiency, unspecified: Secondary | ICD-10-CM | POA: Diagnosis not present

## 2021-11-03 DIAGNOSIS — F411 Generalized anxiety disorder: Secondary | ICD-10-CM | POA: Diagnosis not present

## 2021-11-03 DIAGNOSIS — F331 Major depressive disorder, recurrent, moderate: Secondary | ICD-10-CM | POA: Diagnosis not present

## 2021-11-03 DIAGNOSIS — I10 Essential (primary) hypertension: Secondary | ICD-10-CM | POA: Diagnosis not present

## 2021-11-05 DIAGNOSIS — F902 Attention-deficit hyperactivity disorder, combined type: Secondary | ICD-10-CM | POA: Diagnosis not present

## 2021-11-05 DIAGNOSIS — F411 Generalized anxiety disorder: Secondary | ICD-10-CM | POA: Diagnosis not present

## 2021-11-05 DIAGNOSIS — F3341 Major depressive disorder, recurrent, in partial remission: Secondary | ICD-10-CM | POA: Diagnosis not present

## 2021-11-10 ENCOUNTER — Encounter: Payer: Self-pay | Admitting: *Deleted

## 2021-11-11 DIAGNOSIS — F411 Generalized anxiety disorder: Secondary | ICD-10-CM | POA: Diagnosis not present

## 2021-11-11 DIAGNOSIS — F3341 Major depressive disorder, recurrent, in partial remission: Secondary | ICD-10-CM | POA: Diagnosis not present

## 2021-11-11 DIAGNOSIS — G4733 Obstructive sleep apnea (adult) (pediatric): Secondary | ICD-10-CM | POA: Diagnosis not present

## 2021-11-19 DIAGNOSIS — F3341 Major depressive disorder, recurrent, in partial remission: Secondary | ICD-10-CM | POA: Diagnosis not present

## 2021-11-19 DIAGNOSIS — F411 Generalized anxiety disorder: Secondary | ICD-10-CM | POA: Diagnosis not present

## 2021-11-19 DIAGNOSIS — G4733 Obstructive sleep apnea (adult) (pediatric): Secondary | ICD-10-CM | POA: Diagnosis not present

## 2021-11-20 DIAGNOSIS — Z86010 Personal history of colonic polyps: Secondary | ICD-10-CM | POA: Diagnosis not present

## 2021-11-20 DIAGNOSIS — K3184 Gastroparesis: Secondary | ICD-10-CM | POA: Diagnosis not present

## 2021-11-20 DIAGNOSIS — K2270 Barrett's esophagus without dysplasia: Secondary | ICD-10-CM | POA: Diagnosis not present

## 2021-12-31 ENCOUNTER — Ambulatory Visit: Payer: Medicare PPO | Admitting: Family Medicine

## 2022-01-13 LAB — HM COLONOSCOPY

## 2022-01-29 ENCOUNTER — Encounter: Payer: Self-pay | Admitting: *Deleted

## 2022-03-17 ENCOUNTER — Other Ambulatory Visit: Payer: Self-pay | Admitting: Family Medicine

## 2022-03-26 ENCOUNTER — Other Ambulatory Visit: Payer: Self-pay | Admitting: Family Medicine

## 2022-04-19 ENCOUNTER — Other Ambulatory Visit: Payer: Self-pay | Admitting: Family Medicine

## 2022-06-24 ENCOUNTER — Telehealth: Payer: Self-pay | Admitting: Family Medicine

## 2022-06-24 NOTE — Telephone Encounter (Signed)
Contacted Edward Zavala to schedule their annual wellness visit. Patient declined to schedule AWV at this time.  Patient is not longer a patient of [facility].     Edward Zavala [facility] AWV TEAM Direct Dial [phone]

## 2022-08-06 ENCOUNTER — Encounter: Payer: Self-pay | Admitting: Family Medicine

## 2023-01-25 ENCOUNTER — Ambulatory Visit: Payer: Medicare PPO | Admitting: Cardiovascular Disease

## 2023-02-08 ENCOUNTER — Other Ambulatory Visit: Payer: Self-pay

## 2023-02-08 ENCOUNTER — Encounter (HOSPITAL_BASED_OUTPATIENT_CLINIC_OR_DEPARTMENT_OTHER): Payer: Self-pay | Admitting: Emergency Medicine

## 2023-02-08 ENCOUNTER — Emergency Department (HOSPITAL_BASED_OUTPATIENT_CLINIC_OR_DEPARTMENT_OTHER): Payer: Medicare PPO

## 2023-02-08 ENCOUNTER — Observation Stay (HOSPITAL_BASED_OUTPATIENT_CLINIC_OR_DEPARTMENT_OTHER)
Admission: EM | Admit: 2023-02-08 | Discharge: 2023-02-09 | Disposition: A | Discharge status: Home / Self Care | Payer: Medicare PPO | Attending: Internal Medicine | Admitting: Internal Medicine

## 2023-02-08 DIAGNOSIS — R0602 Shortness of breath: Secondary | ICD-10-CM | POA: Diagnosis not present

## 2023-02-08 DIAGNOSIS — E860 Dehydration: Secondary | ICD-10-CM

## 2023-02-08 DIAGNOSIS — Z8673 Personal history of transient ischemic attack (TIA), and cerebral infarction without residual deficits: Secondary | ICD-10-CM | POA: Diagnosis not present

## 2023-02-08 DIAGNOSIS — Z87891 Personal history of nicotine dependence: Secondary | ICD-10-CM | POA: Diagnosis not present

## 2023-02-08 DIAGNOSIS — Z1152 Encounter for screening for COVID-19: Secondary | ICD-10-CM | POA: Insufficient documentation

## 2023-02-08 DIAGNOSIS — I10 Essential (primary) hypertension: Secondary | ICD-10-CM | POA: Diagnosis not present

## 2023-02-08 DIAGNOSIS — Z79899 Other long term (current) drug therapy: Secondary | ICD-10-CM | POA: Diagnosis not present

## 2023-02-08 DIAGNOSIS — R630 Anorexia: Secondary | ICD-10-CM

## 2023-02-08 DIAGNOSIS — J101 Influenza due to other identified influenza virus with other respiratory manifestations: Secondary | ICD-10-CM

## 2023-02-08 DIAGNOSIS — N179 Acute kidney failure, unspecified: Principal | ICD-10-CM | POA: Diagnosis present

## 2023-02-08 DIAGNOSIS — R531 Weakness: Secondary | ICD-10-CM | POA: Diagnosis present

## 2023-02-08 LAB — COMPREHENSIVE METABOLIC PANEL
ALT: 29 U/L (ref 0–44)
AST: 53 U/L — ABNORMAL HIGH (ref 15–41)
Albumin: 3.9 g/dL (ref 3.5–5.0)
Alkaline Phosphatase: 90 U/L (ref 38–126)
Anion gap: 9 (ref 5–15)
BUN: 56 mg/dL — ABNORMAL HIGH (ref 8–23)
CO2: 29 mmol/L (ref 22–32)
Calcium: 9 mg/dL (ref 8.9–10.3)
Chloride: 96 mmol/L — ABNORMAL LOW (ref 98–111)
Creatinine, Ser: 2.22 mg/dL — ABNORMAL HIGH (ref 0.61–1.24)
GFR, Estimated: 30 mL/min — ABNORMAL LOW (ref >60)
Glucose, Bld: 97 mg/dL (ref 70–99)
Potassium: 4.6 mmol/L (ref 3.5–5.1)
Sodium: 134 mmol/L — ABNORMAL LOW (ref 135–145)
Total Bilirubin: 0.6 mg/dL (ref <1.2)
Total Protein: 6.8 g/dL (ref 6.5–8.1)

## 2023-02-08 LAB — RESP PANEL BY RT-PCR (RSV, FLU A&B, COVID)  RVPGX2
Influenza A by PCR: POSITIVE — AB
Influenza B by PCR: NEGATIVE
Resp Syncytial Virus by PCR: NEGATIVE
SARS Coronavirus 2 by RT PCR: NEGATIVE

## 2023-02-08 LAB — URINALYSIS, W/ REFLEX TO CULTURE (INFECTION SUSPECTED)
Bacteria, UA: NONE SEEN
Bilirubin Urine: NEGATIVE
Glucose, UA: NEGATIVE mg/dL
Ketones, ur: NEGATIVE mg/dL
Leukocytes,Ua: NEGATIVE
Nitrite: NEGATIVE
Protein, ur: NEGATIVE mg/dL
Specific Gravity, Urine: 1.014 (ref 1.005–1.030)
pH: 5 (ref 5.0–8.0)

## 2023-02-08 LAB — CBC WITH DIFFERENTIAL/PLATELET
Abs Immature Granulocytes: 0.01 10*3/uL (ref 0.00–0.07)
Basophils Absolute: 0 10*3/uL (ref 0.0–0.1)
Basophils Relative: 0 %
Eosinophils Absolute: 0 10*3/uL (ref 0.0–0.5)
Eosinophils Relative: 0 %
HCT: 47.9 % (ref 39.0–52.0)
Hemoglobin: 16.3 g/dL (ref 13.0–17.0)
Immature Granulocytes: 0 %
Lymphocytes Relative: 29 %
Lymphs Abs: 1.1 10*3/uL (ref 0.7–4.0)
MCH: 32.7 pg (ref 26.0–34.0)
MCHC: 34 g/dL (ref 30.0–36.0)
MCV: 96 fL (ref 80.0–100.0)
Monocytes Absolute: 0.5 10*3/uL (ref 0.1–1.0)
Monocytes Relative: 13 %
Neutro Abs: 2.1 10*3/uL (ref 1.7–7.7)
Neutrophils Relative %: 58 %
Platelets: 184 10*3/uL (ref 150–400)
RBC: 4.99 MIL/uL (ref 4.22–5.81)
RDW: 12.6 % (ref 11.5–15.5)
WBC: 3.6 10*3/uL — ABNORMAL LOW (ref 4.0–10.5)
nRBC: 0 % (ref 0.0–0.2)

## 2023-02-08 LAB — TROPONIN I (HIGH SENSITIVITY): Troponin I (High Sensitivity): 12 ng/L (ref <18)

## 2023-02-08 LAB — TSH: TSH: 1.581 u[IU]/mL (ref 0.350–4.500)

## 2023-02-08 LAB — LIPASE, BLOOD: Lipase: 65 U/L — ABNORMAL HIGH (ref 11–51)

## 2023-02-08 MED ORDER — MENTHOL 3 MG MT LOZG
1.0000 | LOZENGE | OROMUCOSAL | Status: DC | PRN
  Administered 2023-02-08: 3 mg via ORAL
  Filled 2023-02-08 (×3): qty 9

## 2023-02-08 MED ORDER — ALBUTEROL SULFATE (2.5 MG/3ML) 0.083% IN NEBU: 2.5000 mg | INHALATION_SOLUTION | RESPIRATORY_TRACT | Status: DC | PRN

## 2023-02-08 MED ORDER — GUAIFENESIN 100 MG/5ML PO LIQD
5.0000 mL | ORAL | Status: DC | PRN
  Administered 2023-02-08 – 2023-02-09 (×2): 5 mL via ORAL
  Filled 2023-02-08 (×2): qty 10

## 2023-02-08 MED ORDER — ONDANSETRON HCL 4 MG/2ML IJ SOLN: 4.0000 mg | Freq: Four times a day (QID) | INTRAMUSCULAR | Status: DC | PRN

## 2023-02-08 MED ORDER — LACTATED RINGERS IV BOLUS
1000.0000 mL | Freq: Once | INTRAVENOUS | Status: AC
  Administered 2023-02-08: 1000 mL via INTRAVENOUS

## 2023-02-08 MED ORDER — ACETAMINOPHEN 650 MG RE SUPP: 650.0000 mg | Freq: Four times a day (QID) | RECTAL | Status: DC | PRN

## 2023-02-08 MED ORDER — ATORVASTATIN CALCIUM 10 MG PO TABS
20.0000 mg | ORAL_TABLET | Freq: Every day | ORAL | Status: DC
  Administered 2023-02-09: 20 mg via ORAL
  Filled 2023-02-08: qty 2

## 2023-02-08 MED ORDER — ALPRAZOLAM 0.25 MG PO TABS
0.2500 mg | ORAL_TABLET | Freq: Two times a day (BID) | ORAL | Status: DC | PRN
  Filled 2023-02-08: qty 1

## 2023-02-08 MED ORDER — TRAZODONE HCL 50 MG PO TABS
25.0000 mg | ORAL_TABLET | Freq: Every evening | ORAL | Status: DC | PRN
Start: 2023-02-08 — End: 2023-02-09
  Administered 2023-02-08: 25 mg via ORAL
  Filled 2023-02-08 (×2): qty 1

## 2023-02-08 MED ORDER — HEPARIN SODIUM (PORCINE) 5000 UNIT/ML IJ SOLN
5000.0000 [IU] | Freq: Three times a day (TID) | INTRAMUSCULAR | Status: DC
  Administered 2023-02-08 – 2023-02-09 (×2): 5000 [IU] via SUBCUTANEOUS
  Filled 2023-02-08 (×2): qty 1

## 2023-02-08 MED ORDER — PANTOPRAZOLE SODIUM 40 MG PO TBEC
40.0000 mg | DELAYED_RELEASE_TABLET | Freq: Every day | ORAL | Status: DC
  Administered 2023-02-08 – 2023-02-09 (×2): 40 mg via ORAL
  Filled 2023-02-08 (×3): qty 1

## 2023-02-08 MED ORDER — POLYVINYL ALCOHOL 1.4 % OP SOLN: 1.0000 [drp] | OPHTHALMIC | Status: DC | PRN

## 2023-02-08 MED ORDER — HYPROMELLOSE (GONIOSCOPIC) 2.5 % OP SOLN: 1.0000 [drp] | OPHTHALMIC | Status: DC | PRN

## 2023-02-08 MED ORDER — ACETAMINOPHEN 325 MG PO TABS: 650.0000 mg | ORAL_TABLET | Freq: Four times a day (QID) | ORAL | Status: DC | PRN

## 2023-02-08 MED ORDER — ONDANSETRON HCL 4 MG PO TABS: 4.0000 mg | ORAL_TABLET | Freq: Four times a day (QID) | ORAL | Status: DC | PRN

## 2023-02-08 NOTE — ED Triage Notes (Signed)
C/o shob and fatigue x 2 weeks. Reports 15 lbs of weight loss over the last 2 weeks. Productive cough.

## 2023-02-08 NOTE — ED Notes (Signed)
Called Kim at [employer] for transport: 16:01

## 2023-02-08 NOTE — H&P (Signed)
History and Physical  Edward Zavala DOA: 02/08/2023  PCP: Dois Davenport, MD   Chief Complaint: Weakness, lack of appetite  HPI: Edward Zavala is a 77 y.o. male with medical history significant for depression, hypertension, hyperlipidemia being admitted to the hospital with acute kidney injury in the setting of influenza infection.  Patient states that he was in his usual state of health until a couple of weeks ago, when he started to feel somewhat weak.  Around the same time, he had some swelling in his right middle finger, as well as on the top of his right foot, he was started on a diuretic but cannot recall which one it was.  The day after he started his diuretic, he went on a trip to [place], stopped it about 3 days ago when he came back from [place].  While on his trip, he continued did not feel very well, was already feeling sick before the trip.  He generally has been feeling weak, some bodyaches, intermittent cough.  Not really feeling short of breath.  In the last week or so, he has had almost no appetite, says that he is lost about 13 pounds.  Today came to the ER for evaluation, workup revealed acute kidney injury.  Respiratory swab was positive for influenza A.  Review of Systems: Please see HPI for pertinent positives and negatives. A complete 10 system review of systems are otherwise negative.  Past Medical History:  Diagnosis Date   Anxiety    Depression    Dyslipidemia (high LDL; low HDL)    Gastroparesis    GERD (gastroesophageal reflux disease)    H/O acute pancreatitis    H/O gastroesophageal reflux (GERD)    Headache(784.0)    occasinal    Hyperlipidemia    Hypertension    Stroke [facility])    Past Surgical History:  Procedure Laterality Date   CHOLECYSTECTOMY     ESOPHAGOGASTRIC FUNDOPLICATION     ESOPHAGOGASTRODUODENOSCOPY (EGD) WITH PROPOFOL N/A 08/08/2019   Procedure: ESOPHAGOGASTRODUODENOSCOPY (EGD) WITH PROPOFOL;  Surgeon:  Willis Modena, MD;  Location: [facility] ENDOSCOPY;  Service: Endoscopy;  Laterality: N/A;   EUS N/A 08/08/2019   Procedure: UPPER ENDOSCOPIC ULTRASOUND (EUS) LINEAR;  Surgeon: Willis Modena, MD;  Location: [facility] ENDOSCOPY;  Service: Endoscopy;  Laterality: N/A;  FNA   EYE SURGERY     age 75 months old    FINE NEEDLE ASPIRATION N/A 08/08/2019   Procedure: FINE NEEDLE ASPIRATION (FNA) LINEAR;  Surgeon: Willis Modena, MD;  Location: [facility] ENDOSCOPY;  Service: Endoscopy;  Laterality: N/A;   HERNIA REPAIR     Port Site Hernia   LAPAROSCOPIC NISSEN FUNDOPLICATION     VENTRAL HERNIA REPAIR  12/01/2011   Procedure: LAPAROSCOPIC VENTRAL HERNIA;  Surgeon: Mariella Saa, MD;  Location: [facility] ORS;  Service: General;  Laterality: N/A;  x2  with mesh   Social History:  reports that he quit smoking about 46 years ago. His smoking use included cigarettes. He has never used smokeless tobacco. He reports current alcohol use of about 10.0 - 14.0 standard drinks of alcohol per week. He reports that he does not use drugs.  No Known Allergies  Family History  Problem Relation Age of Onset   Cancer Mother 86       Breast Cancer   Depression Mother    High blood pressure Mother    Alcohol abuse Father    Early death Maternal Grandmother    Heart attack Maternal Grandmother  Depression Daughter    High blood pressure Daughter    Asthma Son    Depression Son      Prior to Admission medications   Medication Sig Start Date End Date Taking? Authorizing Provider  ALPRAZolam (XANAX) 0.25 MG tablet Take 0.25 mg by mouth as needed.    [provider]  Armodafinil 250 MG tablet Take 250 mg by mouth daily.    [provider]  atorvastatin (LIPITOR) 20 MG tablet TAKE ONE TABLET BY MOUTH DAILY 04/20/22   Willow Ora, MD  busPIRone (BUSPAR) 15 MG tablet Take 15 mg by mouth daily. 03/16/17   [provider]  butalbital-aspirin-caffeine Benny Lennert) 50-325-40 MG per capsule Take 1 capsule by  mouth every 6 (six) hours as needed for headache. Headache    [provider]  DULoxetine (CYMBALTA) 30 MG capsule Take 60 mg by mouth daily. 08/31/17   [provider]  hydroxypropyl methylcellulose (ISOPTO TEARS) 2.5 % ophthalmic solution Place 1 drop into both eyes as needed. Dry eyes    [provider]  lisinopril-hydrochlorothiazide (ZESTORETIC) 20-12.5 MG tablet TAKE ONE TABLET BY MOUTH DAILY 03/27/22   Willow Ora, MD  metoprolol succinate (TOPROL-XL) 50 MG 24 hr tablet TAKE ONE TABLET BY MOUTH DAILY WITH OR IMMEDIATELY FOLLOWING A MEAL 03/18/22   Willow Ora, MD  omeprazole (PRILOSEC) 20 MG capsule Take 20 mg by mouth daily.    [provider]  ondansetron (ZOFRAN) 4 MG tablet Take 4 mg by mouth every 8 (eight) hours as needed for nausea or vomiting.    [provider]  Sildenafil Citrate (VIAGRA PO) Take 100 mg by mouth.    [provider]    Physical Exam: BP 112/68 (BP Location: Left Arm)   Pulse 67   Temp 98.1 F (36.7 C) (Oral)   Resp 18   Ht 5\' 8"  (1.727 m)   Wt 90.7 kg   SpO2 98%   BMI 30.40 kg/m  General:  Alert, oriented, calm, in no acute distress  Eyes: EOMI, clear conjuctivae, white sclerea Neck: supple, no masses, trachea mildline  Cardiovascular: RRR, no murmurs or rubs, no peripheral edema  Respiratory: clear to auscultation bilaterally, no wheezes, no crackles  Abdomen: soft, nontender, nondistended, normal bowel tones heard  Skin: dry, no rashes  Musculoskeletal: no joint effusions, normal range of motion  Psychiatric: appropriate affect, normal speech  Neurologic: extraocular muscles intact, clear speech, moving all extremities with intact sensorium         Labs on Admission:  Basic Metabolic Panel: Recent Labs  Lab 02/08/23 1150  NA 134*  K 4.6  CL 96*  CO2 29  GLUCOSE 97  BUN 56*  CREATININE 2.22*  CALCIUM 9.0   Liver Function Tests: Recent Labs  Lab 02/08/23 1150  AST 53*  ALT 29   ALKPHOS 90  BILITOT 0.6  PROT 6.8  ALBUMIN 3.9   Recent Labs  Lab 02/08/23 1150  LIPASE 65*   No results for input(s): "AMMONIA" in the last 168 hours. CBC: Recent Labs  Lab 02/08/23 1150  WBC 3.6*  NEUTROABS 2.1  HGB 16.3  HCT 47.9  MCV 96.0  PLT 184   Cardiac Enzymes: No results for input(s): "CKTOTAL", "CKMB", "CKMBINDEX", "TROPONINI" in the last 168 hours. BNP (last 3 results) No results for input(s): "BNP" in the last 8760 hours.  ProBNP (last 3 results) No results for input(s): "PROBNP" in the last 8760 hours.  CBG: No results for input(s): "GLUCAP"  in the last 168 hours.  Radiological Exams on Admission: DG Chest Port 1 View Result Date: 02/08/2023 CLINICAL DATA:  Shortness of breath. EXAM: PORTABLE CHEST 1 VIEW COMPARISON:  April 25, 2017. FINDINGS: The heart size and mediastinal contours are within normal limits. Both lungs are clear. The visualized skeletal structures are unremarkable. IMPRESSION: No active disease. Electronically Signed   By: Lupita Raider M.D.   On: 02/08/2023 12:09   Assessment/Plan Edward Zavala is a 77 y.o. male with medical history significant for depression, hypertension, hyperlipidemia being admitted to the hospital with acute kidney injury in the setting of influenza infection.   Acute kidney injury-likely due to dehydration from reduced oral intake, and in the setting of taking diuretic over the last 10 days or so.  Patient is unable to recall what the diuretic was.  Creatinine today 2.2, baseline creatinine about 1.1. -Observation admission -Hold diuretics and other nephrotoxins -Renally dose medications -he was hydrated with 1 L LR in the emergency department, will encourage p.o. fluid intake -Monitor renal function with daily labs, check renal ultrasound if not improving  Hypertension-hold ACE, ARB, and diuretics but resume other home medications once reconciled  Influenza A-given length of symptoms, no indication for  influenza specific treatment.  Continue droplet precautions.  Anxiety-Xanax as needed  GERD-omeprazole  DVT prophylaxis: Subcutaneous heparin    Code Status: Full Code  Consults called: None  Admission status: Observation  Time spent: 49 minutes  Samyiah Halvorsen Sharlette Dense MD [facility] Pager [phone]  If 7PM-7AM, please contact night-coverage [email or web address] Password [email or web address]  02/08/2023, 5:24 PM

## 2023-02-08 NOTE — ED Provider Notes (Signed)
[place] EMERGENCY DEPARTMENT AT [facility] Provider Note   CSN: [id number] Arrival date & time: 02/08/23  0944     History  Chief Complaint  Patient presents with   Shortness of Breath    Edward Zavala is a 77 y.o. male.  Patient is a 77 year old male with a history of gastroparesis, stroke, hypertension, GERD and hyperlipidemia who is presenting today with multiple complaints.  He reports everything started approximately 3 weeks ago but initially started with generalized fatigue and poor appetite.  It has progressed and significantly worsened in the last 5 days.  In the last 5 days patient has developed diarrhea, nausea, nonproductive cough.  He has not noticed significant shortness of breath or fever.  He does report that he went to [place] a week ago and got back on Friday but symptoms were happening before he left and just got worse while he was gone.  This morning he did have a loose stool but reported it was the first time it seemed a little more formed.  He is currently still on an antibiotic but he reports not taking it regularly because it would always make him feel worse.  He states he is lost 17 pounds in the last 3 weeks due to no desire to eat and when he does eat it makes him feel nauseated.  The history is provided by the patient.  Shortness of Breath      Home Medications Prior to Admission medications   Medication Sig Start Date End Date Taking? Authorizing Provider  ALPRAZolam (XANAX) 0.25 MG tablet Take 0.25 mg by mouth as needed.    [provider]  Armodafinil 250 MG tablet Take 250 mg by mouth daily.    [provider]  atorvastatin (LIPITOR) 20 MG tablet TAKE ONE TABLET BY MOUTH DAILY 04/20/22   Willow Ora, MD  busPIRone (BUSPAR) 15 MG tablet Take 15 mg by mouth daily. 03/16/17   [provider]  butalbital-aspirin-caffeine Benny Lennert) 50-325-40 MG per capsule Take 1 capsule by mouth every 6 (six) hours as needed for  headache. Headache    [provider]  DULoxetine (CYMBALTA) 30 MG capsule Take 60 mg by mouth daily. 08/31/17   [provider]  hydroxypropyl methylcellulose (ISOPTO TEARS) 2.5 % ophthalmic solution Place 1 drop into both eyes as needed. Dry eyes    [provider]  lisinopril-hydrochlorothiazide (ZESTORETIC) 20-12.5 MG tablet TAKE ONE TABLET BY MOUTH DAILY 03/27/22   Willow Ora, MD  metoprolol succinate (TOPROL-XL) 50 MG 24 hr tablet TAKE ONE TABLET BY MOUTH DAILY WITH OR IMMEDIATELY FOLLOWING A MEAL 03/18/22   Willow Ora, MD  omeprazole (PRILOSEC) 20 MG capsule Take 20 mg by mouth daily.    [provider]  ondansetron (ZOFRAN) 4 MG tablet Take 4 mg by mouth every 8 (eight) hours as needed for nausea or vomiting.    [provider]  Sildenafil Citrate (VIAGRA PO) Take 100 mg by mouth.    [provider]      Allergies    Patient has no known allergies.    Review of Systems   Review of Systems  Respiratory:  Positive for shortness of breath.     Physical Exam Updated Vital Signs BP 112/68 (BP Location: Left Arm)   Pulse 67   Temp 97.7 F (36.5 C) (Oral)   Resp 18   Ht 5\' 8"  (1.727 m)   Wt 90.7 kg   SpO2 98%   BMI  30.40 kg/m  Physical Exam Vitals and nursing note reviewed.  Constitutional:      General: He is not in acute distress.    Appearance: He is well-developed.  HENT:     Head: Normocephalic and atraumatic.     Mouth/Throat:     Mouth: Mucous membranes are dry.  Eyes:     Conjunctiva/sclera: Conjunctivae normal.     Pupils: Pupils are equal, round, and reactive to light.  Cardiovascular:     Rate and Rhythm: Normal rate and regular rhythm.     Pulses: Normal pulses.     Heart sounds: No murmur heard. Pulmonary:     Effort: Pulmonary effort is normal. No respiratory distress.     Breath sounds: Normal breath sounds. No wheezing or rales.  Abdominal:     General: There is no distension.      Palpations: Abdomen is soft.     Tenderness: There is no abdominal tenderness. There is no guarding or rebound.  Musculoskeletal:        General: No tenderness. Normal range of motion.     Cervical back: Normal range of motion and neck supple.     Right lower leg: No edema.     Left lower leg: No edema.  Skin:    General: Skin is warm and dry.     Findings: No erythema or rash.  Neurological:     Mental Status: He is alert and oriented to person, place, and time. Mental status is at baseline.  Psychiatric:        Behavior: Behavior normal.     ED Results / Procedures / Treatments   Labs (all labs ordered are listed, but only abnormal results are displayed) Labs Reviewed  CBC WITH DIFFERENTIAL/PLATELET - Abnormal; Notable for the following components:      Result Value   WBC 3.6 (*)    All other components within normal limits  COMPREHENSIVE METABOLIC PANEL - Abnormal; Notable for the following components:   Sodium 134 (*)    Chloride 96 (*)    BUN 56 (*)    Creatinine, Ser 2.22 (*)    AST 53 (*)    GFR, Estimated 30 (*)    All other components within normal limits  LIPASE, BLOOD - Abnormal; Notable for the following components:   Lipase 65 (*)    All other components within normal limits  URINALYSIS, W/ REFLEX TO CULTURE (INFECTION SUSPECTED) - Abnormal; Notable for the following components:   Hgb urine dipstick TRACE (*)    All other components within normal limits  RESP PANEL BY RT-PCR (RSV, FLU A&B, COVID)  [id number]  TSH  TROPONIN I (HIGH SENSITIVITY)  TROPONIN I (HIGH SENSITIVITY)    EKG None  Radiology DG Chest Port 1 View Result Date: 02/08/2023 CLINICAL DATA:  Shortness of breath. EXAM: PORTABLE CHEST 1 VIEW COMPARISON:  April 25, 2017. FINDINGS: The heart size and mediastinal contours are within normal limits. Both lungs are clear. The visualized skeletal structures are unremarkable. IMPRESSION: No active disease. Electronically Signed   By: Lupita Raider  M.D.   On: 02/08/2023 12:09    Procedures Procedures    Medications Ordered in ED Medications  lactated ringers bolus 1,000 mL (1,000 mLs Intravenous New Bag/Given 02/08/23 1206)    ED Course/ Medical Decision Making/ A&P  Medical Decision Making Amount and/or Complexity of Data Reviewed Labs: ordered. Decision-making details documented in ED Course. Radiology: ordered and independent interpretation performed. Decision-making details documented in ED Course. ECG/medicine tests: ordered and independent interpretation performed. Decision-making details documented in ED Course.   Pt with multiple medical problems and comorbidities and presenting today with a complaint that caries a high risk for morbidity and mortality.  Here today with complaints of ongoing fatigue, weight loss, now with respiratory symptoms and nausea.  Patient appears dehydrated on exam.  Concern for thyroid disease versus infectious etiology such as pneumonia, AKI, electrolyte abnormality.  Oxygen saturation here is 98% and patient does not have significant wheezing on exam.  He has no history of lung disease and does not use an inhaler regularly.  His doctor did give him an inhaler before he went to [place] but he does not feel that it has helped with his symptoms.  No findings to suggest CHF on exam.  Will give patient IV fluids, labs are pending. I have independently visualized and interpreted pt's images today.  Chest x-ray normal limits. I independently interpreted patient's labs and EKG.  Thyroid, troponin, urine all without acute findings.  Lipase minimally elevated at 65, CMP today with new AKI with creatinine of 2.22 from his baseline of 1 with elevated BUN, CBC with mild leukopenia with a white count of 3.6 but normal hemoglobin.  Suspect AKI is most likely from poor oral intake, Lasix use and Zestoretic blood pressure medication.  Patient given IV hydration however given abnormal  labs, his doctor being on vacation for a week and the upcoming holiday will admit for rehydration, ensuring renal function is improving prior to discharge.  Discussed this with the patient he is comfortable with this plan.          Final Clinical Impression(s) / ED Diagnoses Final diagnoses:  None    Rx / DC Orders ED Discharge Orders     None         Gwyneth Sprout, MD 02/08/23 1338

## 2023-02-09 DIAGNOSIS — N179 Acute kidney failure, unspecified: Secondary | ICD-10-CM | POA: Diagnosis not present

## 2023-02-09 LAB — BASIC METABOLIC PANEL
Anion gap: 12 (ref 5–15)
BUN: 39 mg/dL — ABNORMAL HIGH (ref 8–23)
CO2: 24 mmol/L (ref 22–32)
Calcium: 8.6 mg/dL — ABNORMAL LOW (ref 8.9–10.3)
Chloride: 100 mmol/L (ref 98–111)
Creatinine, Ser: 1.35 mg/dL — ABNORMAL HIGH (ref 0.61–1.24)
GFR, Estimated: 54 mL/min — ABNORMAL LOW (ref >60)
Glucose, Bld: 86 mg/dL (ref 70–99)
Potassium: 3.9 mmol/L (ref 3.5–5.1)
Sodium: 136 mmol/L (ref 135–145)

## 2023-02-09 LAB — CBC
HCT: 45 % (ref 39.0–52.0)
Hemoglobin: 14.8 g/dL (ref 13.0–17.0)
MCH: 32.9 pg (ref 26.0–34.0)
MCHC: 32.9 g/dL (ref 30.0–36.0)
MCV: 100 fL (ref 80.0–100.0)
Platelets: 142 10*3/uL — ABNORMAL LOW (ref 150–400)
RBC: 4.5 MIL/uL (ref 4.22–5.81)
RDW: 12.5 % (ref 11.5–15.5)
WBC: 3.4 10*3/uL — ABNORMAL LOW (ref 4.0–10.5)
nRBC: 0 % (ref 0.0–0.2)

## 2023-02-09 NOTE — Discharge Summary (Signed)
Physician Discharge Summary  [name]:[id number] DOB: 09-14-1945 DOA: 02/08/2023  PCP: Dois Davenport, MD  Admit date: 02/08/2023 Discharge date: 02/09/2023  Admitted From: Home  Discharge disposition: Home   Recommendations for Outpatient Follow-Up:   Follow up with your primary care provider in one week.  Check CBC, BMP, magnesium in the next visit   Discharge Diagnosis:   Principal Problem:   ARF (acute renal failure) (HCC)   Discharge Condition: Improved.  Diet recommendation:   Regular.  Wound care: None.  Code status: Full.   History of Present Illness:   Edward Zavala is a 77 y.o. male with past medical history significant for depression, hypertension, hyperlipidemia presented to hospital with weakness after returning from [place].  He was not feeling well during the trip and had some weakness body aches and intermittent cough.  For the last 1 week he had no appetite and had lost weight so he presented to the ED.  In the ED, patient was afebrile.  Labs showed mild leukopenia at 3.6 urinalysis without any infection.  BMP with mild hyponatremia at 134 and creatinine elevation at 2.2.  Influenza A was positive.  TSH was 1.5.  Patient was then considered for admission to hospital for acute kidney injury likely secondary to dehydration in the setting of diuretics.  Hospital Course:   Following conditions were addressed during hospitalization as listed below,    Acute kidney injury-  Creatinine on presentation at 2.2, baseline creatinine about 1.1.  Patient received hydration and creatinine today has improved to 1.3.  Encouraged oral hydration.  Lasix will be discontinued and patient was advised to resume lisinopril HCTZ from 02/11/2023.  Check BMP at that time.   Hypertension-patient states that he is not taking diuretics, will discontinue.  Lisinopril HCTZ to be continued from 02/11/2023.    Influenza A- No indication for specific treatment.   Symptomatic care.   Anxiety-Xanax as needed   GERD-continue PPI from home  Disposition.  At this time, patient is stable for disposition home with outpatient PCP follow-up  Medical Consultants:   None.  Procedures:    None Subjective:   Today, patient was seen and examined.  Patient states that he feels better.  No nausea vomiting abdominal pain diarrhea.  Feels improved and wishes to go home.  Discharge Exam:   Vitals:   02/09/23 0222 02/09/23 0643  BP: 124/75 106/76  Pulse: (!) 57 (!) 55  Resp: 18 16  Temp: 98.2 F (36.8 C) 97.9 F (36.6 C)  SpO2: 96% 96%   Vitals:   02/08/23 1821 02/08/23 2204 02/09/23 0222 02/09/23 0643  BP: 119/70 108/68 124/75 106/76  Pulse: (!) 53 (!) 42 (!) 57 (!) 55  Resp: 18 17 18 16   Temp: 99.4 F (37.4 C) 98.4 F (36.9 C) 98.2 F (36.8 C) 97.9 F (36.6 C)  TempSrc: Oral Oral Oral Oral  SpO2: 99% 97% 96% 96%  Weight:      Height:       Body mass index is 30.4 kg/m.   General: Alert awake, not in obvious distress, obese built HENT: pupils equally reacting to light,  No scleral pallor or icterus noted. Oral mucosa is moist.  Chest:   Diminished breath sounds bilaterally. No crackles or wheezes.  CVS: S1 &S2 heard. No murmur.  Regular rate and rhythm. Abdomen: Soft, nontender, nondistended.  Bowel sounds are heard.   Extremities: No cyanosis, clubbing or edema.  Peripheral pulses are palpable. Psych: Alert, awake  and oriented, normal mood CNS:  No cranial nerve deficits.  Power equal in all extremities.   Skin: Warm and dry.  No rashes noted.  The results of significant diagnostics from this hospitalization (including imaging, microbiology, ancillary and laboratory) are listed below for reference.     Diagnostic Studies:   DG Chest Port 1 View Result Date: 02/08/2023 CLINICAL DATA:  Shortness of breath. EXAM: PORTABLE CHEST 1 VIEW COMPARISON:  April 25, 2017. FINDINGS: The heart size and mediastinal contours are within normal  limits. Both lungs are clear. The visualized skeletal structures are unremarkable. IMPRESSION: No active disease. Electronically Signed   By: Lupita Raider M.D.   On: 02/08/2023 12:09     Labs:   Basic Metabolic Panel: Recent Labs  Lab 02/08/23 1150 02/09/23 0530  NA 134* 136  K 4.6 3.9  CL 96* 100  CO2 29 24  GLUCOSE 97 86  BUN 56* 39*  CREATININE 2.22* 1.35*  CALCIUM 9.0 8.6*   GFR Estimated Creatinine Clearance: 50.1 mL/min (A) (by C-G formula based on SCr of 1.35 mg/dL (H)). Liver Function Tests: Recent Labs  Lab 02/08/23 1150  AST 53*  ALT 29  ALKPHOS 90  BILITOT 0.6  PROT 6.8  ALBUMIN 3.9   Recent Labs  Lab 02/08/23 1150  LIPASE 65*   No results for input(s): "AMMONIA" in the last 168 hours. Coagulation profile No results for input(s): "INR", "PROTIME" in the last 168 hours.  CBC: Recent Labs  Lab 02/08/23 1150 02/09/23 0530  WBC 3.6* 3.4*  NEUTROABS 2.1  --   HGB 16.3 14.8  HCT 47.9 45.0  MCV 96.0 100.0  PLT 184 142*   Cardiac Enzymes: No results for input(s): "CKTOTAL", "CKMB", "CKMBINDEX", "TROPONINI" in the last 168 hours. BNP: Invalid input(s): "POCBNP" CBG: No results for input(s): "GLUCAP" in the last 168 hours. D-Dimer No results for input(s): "DDIMER" in the last 72 hours. Hgb A1c No results for input(s): "HGBA1C" in the last 72 hours. Lipid Profile No results for input(s): "CHOL", "HDL", "LDLCALC", "TRIG", "CHOLHDL", "LDLDIRECT" in the last 72 hours. Thyroid function studies Recent Labs    02/08/23 1150  TSH 1.581   Anemia work up No results for input(s): "VITAMINB12", "FOLATE", "FERRITIN", "TIBC", "IRON", "RETICCTPCT" in the last 72 hours. Microbiology Recent Results (from the past 240 hours)  Resp panel by RT-PCR (RSV, Flu A&B, Covid) Anterior Nasal Swab     Status: Abnormal   Collection Time: 02/08/23  1:05 PM   Specimen: Anterior Nasal Swab  Result Value Ref Range Status   SARS Coronavirus 2 by RT PCR NEGATIVE  NEGATIVE Final    Comment: (NOTE) SARS-CoV-2 target nucleic acids are NOT DETECTED.  The SARS-CoV-2 RNA is generally detectable in upper respiratory specimens during the acute phase of infection. The lowest concentration of SARS-CoV-2 viral copies this assay can detect is 138 copies/mL. A negative result does not preclude SARS-Cov-2 infection and should not be used as the sole basis for treatment or other patient management decisions. A negative result may occur with  improper specimen collection/handling, submission of specimen other than nasopharyngeal swab, presence of viral mutation(s) within the areas targeted by this assay, and inadequate number of viral copies(<138 copies/mL). A negative result must be combined with clinical observations, patient history, and epidemiological information. The expected result is Negative.  Fact Sheet for Patients:  BloggerCourse.com  Fact Sheet for Healthcare Providers:  SeriousBroker.it  This test is no t yet approved or cleared by the [place]  FDA and  has been authorized for detection and/or diagnosis of SARS-CoV-2 by FDA under an Emergency Use Authorization (EUA). This EUA will remain  in effect (meaning this test can be used) for the duration of the COVID-19 declaration under Section 564(b)(1) of the Act, 21 U.S.C.section 360bbb-3(b)(1), unless the authorization is terminated  or revoked sooner.       Influenza A by PCR POSITIVE (A) NEGATIVE Final   Influenza B by PCR NEGATIVE NEGATIVE Final    Comment: (NOTE) The Xpert Xpress SARS-CoV-2/FLU/RSV plus assay is intended as an aid in the diagnosis of influenza from Nasopharyngeal swab specimens and should not be used as a sole basis for treatment. Nasal washings and aspirates are unacceptable for Xpert Xpress SARS-CoV-2/FLU/RSV testing.  Fact Sheet for Patients: BloggerCourse.com  Fact Sheet for  Healthcare Providers: SeriousBroker.it  This test is not yet approved or cleared by the [place] FDA and has been authorized for detection and/or diagnosis of SARS-CoV-2 by FDA under an Emergency Use Authorization (EUA). This EUA will remain in effect (meaning this test can be used) for the duration of the COVID-19 declaration under Section 564(b)(1) of the Act, 21 U.S.C. section 360bbb-3(b)(1), unless the authorization is terminated or revoked.     Resp Syncytial Virus by PCR NEGATIVE NEGATIVE Final    Comment: (NOTE) Fact Sheet for Patients: BloggerCourse.com  Fact Sheet for Healthcare Providers: SeriousBroker.it  This test is not yet approved or cleared by the [place] FDA and has been authorized for detection and/or diagnosis of SARS-CoV-2 by FDA under an Emergency Use Authorization (EUA). This EUA will remain in effect (meaning this test can be used) for the duration of the COVID-19 declaration under Section 564(b)(1) of the Act, 21 U.S.C. section 360bbb-3(b)(1), unless the authorization is terminated or revoked.  Performed at [facility], [address] [place], [place] [address]      Discharge Instructions:   Discharge Instructions     Call MD for:  difficulty breathing, headache or visual disturbances   Complete by: As directed    Call MD for:  persistant nausea and vomiting   Complete by: As directed    Call MD for:  temperature >100.4   Complete by: As directed    Diet general   Complete by: As directed    Discharge instructions   Complete by: As directed    Follow-up with your primary care provider in 1 week.  Check blood work at that time.  Increase oral hydration.  Hold off on taking  blood pressure pill until 02/11/2023.  Seek medical attention for worsening symptoms.   Increase activity slowly   Complete by: As directed       Allergies as of  02/09/2023   No Known Allergies      Medication List     PAUSE taking these medications    lisinopril-hydrochlorothiazide 20-12.5 MG tablet Wait to take this until: February 11, 2023 Commonly known as: ZESTORETIC TAKE ONE TABLET BY MOUTH DAILY       STOP taking these medications    furosemide 20 MG tablet Commonly known as: LASIX       TAKE these medications    ALPRAZolam 0.5 MG tablet Commonly known as: XANAX Take 0.5 mg by mouth 2 (two) times daily as needed for anxiety.   Armodafinil 250 MG tablet Take 250 mg by mouth daily.   atorvastatin 20 MG tablet Commonly known as: LIPITOR TAKE ONE TABLET BY MOUTH DAILY   buPROPion 150 MG  24 hr tablet Commonly known as: WELLBUTRIN XL Take 150 mg by mouth 2 (two) times daily.   busPIRone 15 MG tablet Commonly known as: BUSPAR Take 15 mg by mouth daily.   butalbital-aspirin-caffeine 50-325-40 MG capsule Commonly known as: FIORINAL Take 1 capsule by mouth every 6 (six) hours as needed for headache. Headache   doxycycline 100 MG capsule Commonly known as: VIBRAMYCIN Take 100 mg by mouth 2 (two) times daily.   Erythromycin 250 MG Tbec Take 250 mg by mouth every evening.   metoprolol succinate 50 MG 24 hr tablet Commonly known as: TOPROL-XL TAKE ONE TABLET BY MOUTH DAILY WITH OR IMMEDIATELY FOLLOWING A MEAL   NYQUIL PO Take 30 mLs by mouth at bedtime as needed (sleep, cough).   omeprazole 20 MG capsule Commonly known as: PRILOSEC Take 20 mg by mouth daily.   ondansetron 4 MG tablet Commonly known as: ZOFRAN Take 4 mg by mouth every 8 (eight) hours as needed for nausea or vomiting.   traZODone 50 MG tablet Commonly known as: DESYREL Take 50 mg by mouth at bedtime.   VIAGRA PO Take 100 mg by mouth as needed (erectile dysfunction).   Vitamin D (Ergocalciferol) 1.25 MG (50000 UNIT) Caps capsule Commonly known as: DRISDOL Take 50,000 Units by mouth once a week.        Follow-up Information      Dois Davenport, MD Follow up in 1 week(s).   Specialty: Family Medicine Contact information: [address] [place] [address] [phone]                  Time coordinating discharge: 39 minutes  Signed:  Freada Twersky  [facility] 02/09/2023, 3:17 PM

## 2023-02-09 NOTE — Hospital Course (Signed)
Edward Zavala is a 77 y.o. male with past medical history significant for depression, hypertension, hyperlipidemia presented to hospital with weakness after returning from [place].  He was not feeling well during the trip and had some weakness body aches and intermittent cough.  For the last 1 week he had no appetite and had lost weight so he presented to the ED.  In the ED, patient was afebrile.  Labs showed mild leukopenia at 3.6 urinalysis without any infection.  BMP with mild hyponatremia at 134 and creatinine elevation at 2.2.  Influenza A was positive.  TSH was 1.5.  Patient was then considered for admission to hospital for acute kidney injury likely secondary to dehydration in the setting of diuretics.  Assessment and plan   Acute kidney injury-  Creatinine on presentation at 2.2, baseline creatinine about 1.1.  Continue to hold diuretics.  Patient received hydration and creatinine today has improved to 1.3.   Hypertension-continue to hold ACE, ARB, and diuretics but resume other home medications once reconciled   Influenza A- No indication for specific treatment.  Continue droplet precautions.   Anxiety-Xanax as needed   GERD-omeprazole

## 2023-02-12 ENCOUNTER — Encounter: Payer: Self-pay | Admitting: Cardiovascular Disease

## 2023-02-12 ENCOUNTER — Ambulatory Visit: Payer: Medicare PPO | Attending: Cardiovascular Disease | Admitting: Cardiovascular Disease

## 2023-02-12 VITALS — BP 130/84 | HR 71 | Ht 68.0 in | Wt 188.8 lb

## 2023-02-12 DIAGNOSIS — I10 Essential (primary) hypertension: Secondary | ICD-10-CM

## 2023-02-12 DIAGNOSIS — D696 Thrombocytopenia, unspecified: Secondary | ICD-10-CM | POA: Diagnosis not present

## 2023-02-12 DIAGNOSIS — I4891 Unspecified atrial fibrillation: Secondary | ICD-10-CM

## 2023-02-12 DIAGNOSIS — R0609 Other forms of dyspnea: Secondary | ICD-10-CM | POA: Diagnosis not present

## 2023-02-12 LAB — CBC

## 2023-02-12 NOTE — Progress Notes (Signed)
Cardiology Office Note:    Date:  02/12/2023   ID:  Edward Zavala, DOB [birth year], MRN [id number]  PCP:  Dois Davenport, MD   [facility] Providers Cardiologist:  None     Referring MD: Dois Davenport, MD   No chief complaint on file. Edward Zavala is a 77 y.o. male who is being seen today for the evaluation of new onset atrial fibrillation at the request of Hal Hope, Marcos Eke, MD.   History of Present Illness:    Edward Zavala is a 77 y.o. male with a hx of dyslipidemia, hypertension, previous "mini stroke", who was referred in consultation by Dr. Hal Hope for new onset atrial fibrillation.  I do not have actual details of the instance of arrhythmia that led to the referral, but Mr. Whack thinks it refers to an episode of atrial fibrillation detected when he had cataract surgery in July.  Regardless, he had another episode of atrial fibrillation that is well-documented on his electrocardiogram done on arrival to the hospital on 02/08/2023, when he had atrial fibrillation with controlled ventricular response, in the setting of dehydration and acute kidney injury.  On both occasions he was completely unaware of palpitations.  On the other hand for the last 6-8 months he has noticed that he is fatigued.  He describes it primarily as lethargy rather than dyspnea.  He typically walks his dog for about half a mile and has to stop about times during that walk to catch his breath.  He also describes bendopnea for similar duration of time.  He denies dizziness or syncope.  He has not had problems with daytime hypersomnolence.  He does not have a history of falls or easy bleeding.  In March 2019 he presented with vertigo and gait instability.  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  It was not clear whether this was due to thromboembolism or dissection.  CT angiogram of the neck showed no evidence of stenosis or plaque in the carotid circulation or  the contralateral vertebral artery.  Cardiogram showed normal LV function with mild LVH and a normal-sized left atrium, no valvular abnormalities.  He was started on dual antiplatelet therapy with aspirin and clopidogrel and was given atorvastatin for an elevated LDL cholesterol.  No arrhythmia was detected during that hospital stay.  In March 2023 he was seen in the office in consultation by Dr. [place] for dyspnea on exertion.  The symptoms were transient.  His ECG showed normal sinus rhythm with PACs.  He had a nuclear stress test that showed normal perfusion and LVEF 68%.  Of note, he was unable to reach target heart rate on the treadmill and therefore he had a pharmacological stress test.  3 weeks ago he had complaints of generalized fatigue and poor appetite and he was prescribed a brief course of diuretics for swelling in his fingers.  He then developed diarrhea and nausea.  He left on a trip to [place] the symptoms worsened.  He presented to the hospital on return to the [place] with a 17 pound weight loss and labs consistent with acute kidney injury.  Cardiac biomarkers were normal.  Both his platelet count and WBC count was slightly lower than normal suggesting may be bone marrow suppression from a viral infection.  He was in atrial fibrillation with controlled ventricular response.  He improved with intravenous fluids and by the time he left the hospital he was  back in normal rhythm.  His renal function parameters were substantially improved although not back to baseline just yet.  He has been off lisinopril-hydrochlorothiazide since then.  Past Medical History:  Diagnosis Date   Anxiety    Depression    Dyslipidemia (high LDL; low HDL)    Gastroparesis    GERD (gastroesophageal reflux disease)    H/O acute pancreatitis    H/O gastroesophageal reflux (GERD)    Headache(784.0)    occasinal    Hyperlipidemia    Hypertension    Stroke [facility])     Past Surgical History:  Procedure  Laterality Date   CHOLECYSTECTOMY     ESOPHAGOGASTRIC FUNDOPLICATION     ESOPHAGOGASTRODUODENOSCOPY (EGD) WITH PROPOFOL N/A 08/08/2019   Procedure: ESOPHAGOGASTRODUODENOSCOPY (EGD) WITH PROPOFOL;  Surgeon: Willis Modena, MD;  Location: [facility];  Service: Endoscopy;  Laterality: N/A;   EUS N/A 08/08/2019   Procedure: UPPER ENDOSCOPIC ULTRASOUND (EUS) LINEAR;  Surgeon: Willis Modena, MD;  Location: [facility];  Service: Endoscopy;  Laterality: N/A;  FNA   EYE SURGERY     age 10 months old    FINE NEEDLE ASPIRATION N/A 08/08/2019   Procedure: FINE NEEDLE ASPIRATION (FNA) LINEAR;  Surgeon: Willis Modena, MD;  Location: [facility];  Service: Endoscopy;  Laterality: N/A;   HERNIA REPAIR     Port Site Hernia   LAPAROSCOPIC NISSEN FUNDOPLICATION     VENTRAL HERNIA REPAIR  12/01/2011   Procedure: LAPAROSCOPIC VENTRAL HERNIA;  Surgeon: Mariella Saa, MD;  Location: [facility];  Service: General;  Laterality: N/A;  x2  with mesh    Current Medications: Current Meds  Medication Sig   ALPRAZolam (XANAX) 0.5 MG tablet Take 0.5 mg by mouth 2 (two) times daily as needed for anxiety.   Armodafinil 250 MG tablet Take 250 mg by mouth daily.   atorvastatin (LIPITOR) 20 MG tablet TAKE ONE TABLET BY MOUTH DAILY   buPROPion (WELLBUTRIN XL) 150 MG 24 hr tablet Take 150 mg by mouth 2 (two) times daily.   busPIRone (BUSPAR) 15 MG tablet Take 15 mg by mouth daily.   Erythromycin 250 MG TBEC Take 250 mg by mouth every evening.   metoprolol succinate (TOPROL-XL) 50 MG 24 hr tablet TAKE ONE TABLET BY MOUTH DAILY WITH OR IMMEDIATELY FOLLOWING A MEAL   omeprazole (PRILOSEC) 20 MG capsule Take 20 mg by mouth daily.   Vitamin D, Ergocalciferol, (DRISDOL) 1.25 MG (50000 UNIT) CAPS capsule Take 50,000 Units by mouth once a week.   [DISCONTINUED] lisinopril-hydrochlorothiazide (ZESTORETIC) 20-12.5 MG tablet TAKE ONE TABLET BY MOUTH DAILY     Allergies:   Patient has no known allergies.   Social  History   Socioeconomic History   Marital status: Married    Spouse name: Not on file   Number of children: 2   Years of education: Not on file   Highest education level: Not on file  Occupational History   Not on file  Tobacco Use   Smoking status: Former    Current packs/day: 0.00    Types: Cigarettes    Quit date: 09/30/1976    Years since quitting: 46.4   Smokeless tobacco: Never  Vaping Use   Vaping status: Never Used  Substance and Sexual Activity   Alcohol use: Yes    Alcohol/week: 10.0 - 14.0 standard drinks of alcohol    Types: 10 - 14 Standard drinks or equivalent per week   Drug use: No   Sexual activity: Yes    Partners: Female  Other Topics Concern   Not on file  Social History Narrative   Retired Consulting civil engineer.    Social Drivers of Corporate investment banker Strain: Low Risk  (09/02/2020)   Overall Financial Resource Strain (CARDIA)    Difficulty of Paying Living Expenses: Not hard at all  Food Insecurity: No Food Insecurity (02/08/2023)   Hunger Vital Sign    Worried About Running Out of Food in the Last Year: Never true    Ran Out of Food in the Last Year: Never true  Transportation Needs: No Transportation Needs (02/08/2023)   PRAPARE - Administrator, Civil Service (Medical): No    Lack of Transportation (Non-Medical): No  Physical Activity: Inactive (09/02/2020)   Exercise Vital Sign    Days of Exercise per Week: 0 days    Minutes of Exercise per Session: 0 min  Stress: Stress Concern Present (09/02/2020)   [employer] of Occupational Health - Occupational Stress Questionnaire    Feeling of Stress : To some extent  Social Connections: Unknown (09/02/2020)   Social Connection and Isolation Panel [NHANES]    Frequency of Communication with Friends and Family: More than three times a week    Frequency of Social Gatherings with Friends and Family: More than three times a week    Attends Religious Services:  Not on Marketing executive or Organizations: Not on file    Attends Banker Meetings: Not on file    Marital Status: Married     Family History: The patient's family history includes Alcohol abuse in his father; Asthma in his son; Cancer (age of onset: 60) in his mother; Depression in his daughter, mother, and son; Early death in his maternal grandmother; Heart attack in his maternal grandmother; High blood pressure in his daughter and mother.  ROS:   Please see the history of present illness.     All other systems reviewed and are negative.  EKGs/Labs/Other Studies Reviewed:    The following studies were reviewed today:  EKG Interpretation Date/Time:  Friday February 12 2023 08:31:19 EST Ventricular Rate:  71 PR Interval:  274 QRS Duration:  84 QT Interval:  412 QTC Calculation: 447 R Axis:   -12  Text Interpretation: Sinus rhythm with 1st degree A-V block When compared with ECG of 08-Feb-2023 10:01, Sinus rhythm has replaced Atrial fibrillation Criteria for Anteroseptal infarct are no longer Present Confirmed by Jericca Russett [phone]) on 02/12/2023 8:42:28 AM    Recent Labs: 02/08/2023: ALT 29; TSH 1.581 02/09/2023: BUN 39; Creatinine, Ser 1.35; Hemoglobin 14.8; Platelets 142; Potassium 3.9; Sodium 136  Recent Lipid Panel    Component Value Date/Time   CHOL 147 04/02/2021 1647   TRIG 145.0 04/02/2021 1647   HDL 44.40 04/02/2021 1647   CHOLHDL 3 04/02/2021 1647   VLDL 29.0 04/02/2021 1647   LDLCALC 74 04/02/2021 1647     Risk Assessment/Calculations:    CHA2DS2-VASc Score = 5   This indicates a 7.2% annual risk of stroke. The patient's score is based upon: CHF History: 0 HTN History: 1 Diabetes History: 0 Stroke History: 2 Vascular Disease History: 0 Age Score: 2 Gender Score: 0               Physical Exam:    VS:  BP 130/84 (BP Location: Left Arm, Patient Position: Sitting)   Pulse 71   Ht 5\' 8"  (1.727 m)   Wt 188 lb 12.8  oz (  85.6 kg)   SpO2 96%   BMI 28.71 kg/m     Wt Readings from Last 3 Encounters:  02/12/23 188 lb 12.8 oz (85.6 kg)  02/08/23 199 lb 15.3 oz (90.7 kg)  08/13/21 200 lb (90.7 kg)     GEN: Overweight, well nourished, well developed in no acute distress HEENT: Normal NECK: 6-8 cm elevation in JVD and hepatojugular reflux present; No carotid bruits LYMPHATICS: No lymphadenopathy CARDIAC: Occasional ectopy on a background of RRR, no murmurs, rubs, gallops RESPIRATORY:  Clear to auscultation without rales, wheezing or rhonchi  ABDOMEN: Soft, non-tender, non-distended MUSCULOSKELETAL: Symmetrical 1+ ankle edema; No deformity  SKIN: Warm and dry NEUROLOGIC:  Alert and oriented x 3 PSYCHIATRIC:  Normal affect   ASSESSMENT:    1. New onset atrial fibrillation (HCC)   2. Dyspnea on exertion   3. Thrombocytopenia (HCC)   4. Essential hypertension    PLAN:    In order of problems listed above:  AFib: It appears that he has had at least 2 instances of atrial fibrillation, both asymptomatic.  Rate is well-controlled probably since he is on chronic beta-blocker therapy.  He has a previous transient ischemic attack with radiographic findings strongly suggestive of thromboembolic event.  I think is pretty convincing that he has had recurrent asymptomatic paroxysmal atrial fibrillation.  Recommend treatment with anticoagulation and have started him on Eliquis 5 mg twice daily.  Will try to get an idea about the true prevalence of the arrhythmia on event monitor.  Continue metoprolol. CHF: He describes about 6/8 months of symptoms consistent with heart failure (exertional fatigue and dyspnea, bendopnea).  On physical exam he has evidence of hypervolemia (JVD, ankle swelling).  Based on previous evaluation is likely that he has diastolic heart failure, but I think we should recheck LVEF, with a new diagnosis of atrial fibrillation.  Also check a BNP. HTN: He presented with acute kidney injury and  lisinopril-hydrochlorothiazide has been stopped.  His blood pressure remains normal, but the effects of hydrochlorothiazide are likely still lingering since its only been 4 days since he stopped it.  Asked him to keep a log of his blood pressure and we will decide when it is time to resume antihypertensive medications.  Ideally will start lisinopril alone and try to avoid the diuretic. AKI: Improving at the time of hospital discharge, but not yet completely back to normal.  Since we are checking labs we will follow-up on this. Mild leukopenia/thrombocytopenia: Recent abnormalities that are most likely a sign of an acute viral syndrome with bone marrow suppression.  Recheck labs today.           Medication Adjustments/Labs and Tests Ordered: Current medicines are reviewed at length with the patient today.  Concerns regarding medicines are outlined above.  Orders Placed This Encounter  Procedures   Basic metabolic panel   Brain natriuretic peptide   CBC   CARDIAC EVENT MONITOR   EKG 12-Lead   ECHOCARDIOGRAM COMPLETE   No orders of the defined types were placed in this encounter.   Patient Instructions  Medication Instructions:  STOP LISINOPRIL (UNTIL FURTHER NOTICE- will reassess after lab and test results) *If you need a refill on your cardiac medications before your next appointment, please call your pharmacy*   Lab Work: CBC, BMP, BNP If you have labs (blood work) drawn today and your tests are completely normal, you will receive your results only by: MyChart Message (if you have MyChart) OR A paper copy in the mail  If you have any lab test that is abnormal or we need to change your treatment, we will call you to review the results.   Testing/Procedures: Your physician has requested that you have an echocardiogram. Echocardiography is a painless test that uses sound waves to create images of your heart. It provides your doctor with information about the size and shape of your  heart and how well your heart's chambers and valves are working. This procedure takes approximately one hour. There are no restrictions for this procedure. Please do NOT wear cologne, perfume, aftershave, or lotions (deodorant is allowed). Please arrive 15 minutes prior to your appointment time.  Please note: We ask at that you not bring children with you during ultrasound (echo/ vascular) testing. Due to room size and safety concerns, children are not allowed in the ultrasound rooms during exams. Our front office staff cannot provide observation of children in our lobby area while testing is being conducted. An adult accompanying a patient to their appointment will only be allowed in the ultrasound room at the discretion of the ultrasound technician under special circumstances. We apologize for any inconvenience.   Your physician has recommended that you wear an event monitor. Event monitors are medical devices that record the heart's electrical activity. Doctors most often [place] these monitors to diagnose arrhythmias. Arrhythmias are problems with the speed or rhythm of the heartbeat. The monitor is a small, portable device. You can wear one while you do your normal daily activities. This is usually used to diagnose what is causing palpitations/syncope (passing out).   Your physician has recommended that you wear a 30 day monitor. The cardiac monitor continuously records heart rhythm data for up to 30 days, this is for patients being evaluated for multiple types heart rhythms. For the first 24 hours post application, please avoid getting the monitor wet in the shower or by excessive sweating during exercise. After that, feel free to carry on with regular activities. Keep soaps and lotions away from the Patch.  This will be mailed to you, please expect 7-10 days to receive.    Applying the monitor   Shave hair from upper left chest.   Hold abrader disc by orange tab.  Rub abrader in 40 strokes over left  upper chest as indicated in your monitor instructions.   Clean area with 4 enclosed alcohol pads .  Use all pads to assure are is cleaned thoroughly.  Let dry.   Apply patch as indicated in monitor instructions.  Patch will be place under collarbone on left side of chest with arrow pointing upward.   Rub patch adhesive wings for 2 minutes.Remove white label marked "1".  Remove white label marked "2".  Rub patch adhesive wings for 2 additional minutes.   While looking in a mirror, press and release button in center of patch.  A small green light will flash 3-4 times .  This will be your only indicator the monitor has been turned on.     Do not shower for the first 24 hours.  You may shower after the first 24 hours.   Press button if you feel a symptom. You will hear a small click.  Record Date, Time and Symptom in the Patient Log Book.   When you are ready to remove patch, follow instructions on last 2 pages of Patient Log Book.  Stick patch monitor onto last page of Patient Log Book.   Place Patient Log Book in [place] box.  Use locking tab  on box and tape box closed securely.  The Orange and [employer] has [employer] on it.  Please place in mailbox as soon as possible.  Your physician should have your test results approximately 7 days after the monitor has been mailed back to [facility].   Call [facility] Customer Care at [phone] if you have questions regarding your ZIO XT patch monitor.  Call them immediately if you see an orange light blinking on your monitor.   If your monitor falls off in less than 4 days contact our Monitor department at [phone].  If your monitor becomes loose or falls off after 4 days call [employer] at [phone] for suggestions on securing your monitor    Follow-Up: At [facility], you and your health needs are our priority.  As part of our continuing mission to provide you with exceptional heart care, we have created designated  Provider Care Teams.  These Care Teams include your primary Cardiologist (physician) and Advanced Practice Providers (APPs -  Physician Assistants and Nurse Practitioners) who all work together to provide you with the care you need, when you need it.  We recommend signing up for the patient portal called "MyChart".  Sign up information is provided on this After Visit Summary.  MyChart is used to connect with patients for Virtual Visits (Telemedicine).  Patients are able to view lab/test results, encounter notes, upcoming appointments, etc.  Non-urgent messages can be sent to your provider as well.   To learn more about what you can do with MyChart, go to [email or web address].    Your next appointment:    2-3 MONTHS (After tests)  Provider:   Dr Royann Shivers         Signed, Thurmon Fair, MD  02/12/2023 6:18 PM    [facility]

## 2023-02-12 NOTE — Patient Instructions (Addendum)
Medication Instructions:  STOP LISINOPRIL (UNTIL FURTHER NOTICE- will reassess after lab and test results) *If you need a refill on your cardiac medications before your next appointment, please call your pharmacy*   Lab Work: CBC, BMP, BNP If you have labs (blood work) drawn today and your tests are completely normal, you will receive your results only by: MyChart Message (if you have MyChart) OR A paper copy in the mail If you have any lab test that is abnormal or we need to change your treatment, we will call you to review the results.   Testing/Procedures: Your physician has requested that you have an echocardiogram. Echocardiography is a painless test that uses sound waves to create images of your heart. It provides your doctor with information about the size and shape of your heart and how well your heart's chambers and valves are working. This procedure takes approximately one hour. There are no restrictions for this procedure. Please do NOT wear cologne, perfume, aftershave, or lotions (deodorant is allowed). Please arrive 15 minutes prior to your appointment time.  Please note: We ask at that you not bring children with you during ultrasound (echo/ vascular) testing. Due to room size and safety concerns, children are not allowed in the ultrasound rooms during exams. Our front office staff cannot provide observation of children in our lobby area while testing is being conducted. An adult accompanying a patient to their appointment will only be allowed in the ultrasound room at the discretion of the ultrasound technician under special circumstances. We apologize for any inconvenience.   Your physician has recommended that you wear an event monitor. Event monitors are medical devices that record the heart's electrical activity. Doctors most often [place] these monitors to diagnose arrhythmias. Arrhythmias are problems with the speed or rhythm of the heartbeat. The monitor is a small, portable  device. You can wear one while you do your normal daily activities. This is usually used to diagnose what is causing palpitations/syncope (passing out).   Your physician has recommended that you wear a 30 day monitor. The cardiac monitor continuously records heart rhythm data for up to 30 days, this is for patients being evaluated for multiple types heart rhythms. For the first 24 hours post application, please avoid getting the monitor wet in the shower or by excessive sweating during exercise. After that, feel free to carry on with regular activities. Keep soaps and lotions away from the Patch.  This will be mailed to you, please expect 7-10 days to receive.    Applying the monitor   Shave hair from upper left chest.   Hold abrader disc by orange tab.  Rub abrader in 40 strokes over left upper chest as indicated in your monitor instructions.   Clean area with 4 enclosed alcohol pads .  Use all pads to assure are is cleaned thoroughly.  Let dry.   Apply patch as indicated in monitor instructions.  Patch will be place under collarbone on left side of chest with arrow pointing upward.   Rub patch adhesive wings for 2 minutes.Remove white label marked "1".  Remove white label marked "2".  Rub patch adhesive wings for 2 additional minutes.   While looking in a mirror, press and release button in center of patch.  A small green light will flash 3-4 times .  This will be your only indicator the monitor has been turned on.     Do not shower for the first 24 hours.  You may shower after the  first 24 hours.   Press button if you feel a symptom. You will hear a small click.  Record Date, Time and Symptom in the Patient Log Book.   When you are ready to remove patch, follow instructions on last 2 pages of Patient Log Book.  Stick patch monitor onto last page of Patient Log Book.   Place Patient Log Book in [place] box.  Use locking tab on box and tape box closed securely.  The [employer] and [employer] has  [employer] on it.  Please place in mailbox as soon as possible.  Your physician should have your test results approximately 7 days after the monitor has been mailed back to [facility].   Call [facility] Customer Care at [phone] if you have questions regarding your ZIO XT patch monitor.  Call them immediately if you see an orange light blinking on your monitor.   If your monitor falls off in less than 4 days contact our Monitor department at [phone].  If your monitor becomes loose or falls off after 4 days call [employer] at [phone] for suggestions on securing your monitor    Follow-Up: At [facility], you and your health needs are our priority.  As part of our continuing mission to provide you with exceptional heart care, we have created designated Provider Care Teams.  These Care Teams include your primary Cardiologist (physician) and Advanced Practice Providers (APPs -  Physician Assistants and Nurse Practitioners) who all work together to provide you with the care you need, when you need it.  We recommend signing up for the patient portal called "MyChart".  Sign up information is provided on this After Visit Summary.  MyChart is used to connect with patients for Virtual Visits (Telemedicine).  Patients are able to view lab/test results, encounter notes, upcoming appointments, etc.  Non-urgent messages can be sent to your provider as well.   To learn more about what you can do with MyChart, go to [email or web address].    Your next appointment:    2-3 MONTHS (After tests)  Provider:   Dr Royann Shivers

## 2023-02-13 LAB — BASIC METABOLIC PANEL
BUN/Creatinine Ratio: 15 (ref 10–24)
BUN: 16 mg/dL (ref 8–27)
CO2: 24 mmol/L (ref 20–29)
Calcium: 8.9 mg/dL (ref 8.6–10.2)
Chloride: 102 mmol/L (ref 96–106)
Creatinine, Ser: 1.08 mg/dL (ref 0.76–1.27)
Glucose: 94 mg/dL (ref 8.6–99)
Potassium: 5.2 mmol/L (ref 3.5–5.2)
Sodium: 139 mmol/L (ref 134–144)
eGFR: 71 mL/min/{1.73_m2} (ref >59)

## 2023-02-13 LAB — CBC
Hematocrit: 43.9 % (ref 37.5–51.0)
Hemoglobin: 14.4 g/dL (ref 13.0–17.7)
MCH: 31.7 pg (ref 26.6–33.0)
MCHC: 32.8 g/dL (ref 31.5–35.7)
MCV: 97 fL (ref 79–97)
Platelets: 161 10*3/uL (ref 150–450)
RBC: 4.54 x10E6/uL (ref 4.14–5.80)
RDW: 12.1 % (ref 11.6–15.4)
WBC: 3.5 10*3/uL (ref 3.4–10.8)

## 2023-02-13 LAB — BRAIN NATRIURETIC PEPTIDE: BNP: 172.8 pg/mL — ABNORMAL HIGH (ref 0.0–100.0)

## 2023-02-14 ENCOUNTER — Other Ambulatory Visit: Payer: Self-pay | Admitting: Family Medicine

## 2023-02-15 ENCOUNTER — Ambulatory Visit (HOSPITAL_COMMUNITY)
Admission: RE | Admit: 2023-02-15 | Discharge: 2023-02-15 | Disposition: A | Discharge status: Home / Self Care | Payer: Medicare PPO | Source: Ambulatory Visit | Attending: Internal Medicine | Admitting: Internal Medicine

## 2023-02-15 ENCOUNTER — Telehealth: Payer: Self-pay | Admitting: *Deleted

## 2023-02-15 DIAGNOSIS — R0609 Other forms of dyspnea: Secondary | ICD-10-CM | POA: Insufficient documentation

## 2023-02-15 DIAGNOSIS — I4891 Unspecified atrial fibrillation: Secondary | ICD-10-CM | POA: Insufficient documentation

## 2023-02-15 LAB — ECHOCARDIOGRAM COMPLETE
AR max vel: 3.25 cm2
AV Area VTI: 3.27 cm2
AV Area mean vel: 3.07 cm2
AV Mean grad: 1 mm[Hg]
AV Peak grad: 2.5 mm[Hg]
Ao pk vel: 0.79 m/s
Area-P 1/2: 4.06 cm2
MV M vel: 1.98 m/s
MV Peak grad: 15.7 mm[Hg]
S' Lateral: 2.27 cm

## 2023-02-15 MED ORDER — APIXABAN 5 MG PO TABS: 5.0000 mg | ORAL_TABLET | Freq: Two times a day (BID) | ORAL | 3 refills | Status: DC

## 2023-02-15 NOTE — Telephone Encounter (Signed)
Rn confirmed with patient . Patient states he is not using Fiorinal Patient states he does not think he has this medication any more. Patient is aware not use both  medication without contacting office.   Rn informed patient medication has been sent to pharmacy for pick up. RN verbalized instructed patient to take medication twice a day . Patient verbalized understanding.

## 2023-02-15 NOTE — Telephone Encounter (Signed)
Patient   in the office for  Echo testing - RN informed technician and RN  that medication  ( blood thinner) has not been called into the pharmacy  Patient states Dr Royann Shivers mention the medication but was not received at pharmacy.    RN reviewed last office note 02/12/23  Dr Croitoru's Plan: AFib: It appears that he has had at least 2 instances of atrial fibrillation, both asymptomatic.  Rate is well-controlled probably since he is on chronic beta-blocker therapy.  He has a previous transient ischemic attack with radiographic findings strongly suggestive of thromboembolic event.  I think is pretty convincing that he has had recurrent asymptomatic paroxysmal atrial fibrillation.  Recommend treatment with anticoagulation and have started him on Eliquis 5 mg twice daily.  Will try to get an idea about the true prevalence of the arrhythmia on event monitor.  Continue metoprolol.

## 2023-02-23 ENCOUNTER — Telehealth: Payer: Self-pay | Admitting: Cardiology

## 2023-02-23 DIAGNOSIS — I4891 Unspecified atrial fibrillation: Secondary | ICD-10-CM | POA: Diagnosis not present

## 2023-02-23 NOTE — Telephone Encounter (Signed)
 Outpatient service line:  Patient has heart monitor to assess A-fib burden.  [employer] called notifying me that patient went into atrial flutter with heart rates in the 50s.  Patient called on asymptomatic no complaints of chest pain, palpitations, syncope, dizziness.  He does feel fatigued though.  He also reported device was giving him some messages about poor skin contact.  He was recommended to call [employer] to see if he needs to change out his monitor for further instructions.

## 2023-02-24 ENCOUNTER — Telehealth: Payer: Self-pay

## 2023-02-24 NOTE — Telephone Encounter (Signed)
   Cardiac Monitor Alert  Date of alert:  02/24/2023   Patient Name: Edward Zavala  DOB: 19-Apr-1945  MRN: [id number]   [facility] Cardiologist:Croitoru [facility] EP:  None    Monitor Information: Cardiac Event Monitor [[employer]]  Reason:  New onset atrial fibrillation   Ordering provider:  Croitoru   Alert Atrial Fibrillation/Flutter This is the 1st alert for this rhythm.  The patient has a hx of Atrial Fibrillation/Flutter.    Anticoagulation medication as of 02/24/2023           apixaban  (ELIQUIS ) 5 MG TABS tablet Take 1 tablet (5 mg total) by mouth 2 (two) times daily.       Next Cardiology Appointment   Date: 04/27/23  Provider:  Dr Francyne  The patient was contacted today.  He is asymptomatic. Arrhythmia, symptoms and history reviewed with Dr Sheena.  Plan:  No changes at this time    Other: Patient was contacted by on call provider Thom Sluder, PA and was asymptomatic. He did report feeling fatigue.   Damaso Laday, RN  02/24/2023 9:31 AM

## 2023-02-28 ENCOUNTER — Telehealth: Payer: Self-pay | Admitting: Internal Medicine

## 2023-02-28 NOTE — Telephone Encounter (Signed)
 Received call from [employer] for arrhythmia alert. Patient had a 3.9 second pause at 2:44 AM CST. He was in atrial flutter prior to and after the event. Around the same time he had atrial flutter with slow ventricular rates into 30s. Attempted to reach patient but he did not answer. Suspect he was sleeping at the time of the event. Message routed to primary cardiologist.

## 2023-03-01 ENCOUNTER — Telehealth: Payer: Self-pay

## 2023-03-01 ENCOUNTER — Encounter: Payer: Self-pay | Admitting: Cardiovascular Disease

## 2023-03-01 NOTE — Telephone Encounter (Signed)
 Viewed report fax for critical event monitor. See Dr Anthony note regarding attempting to call patient during 3.9 second pause.  Called and spoke with patient who states that he slept right thru the night and didn't feel any different.  States however the last few days he has noticed feeling even more tired and short of breath. Is concerned that his symptoms are getting worse. Informed I would review the strip and his symptoms with the DOD and call him back.

## 2023-03-03 ENCOUNTER — Telehealth: Payer: Self-pay

## 2023-03-03 NOTE — Telephone Encounter (Signed)
 RN spoke with Dr. Alvis Ba  Dr. Alvis Ba stated no changes at this time, confirm if patient take Metoprolol     Document placed in scans

## 2023-03-03 NOTE — Telephone Encounter (Signed)
 Patient identification verified by 2 forms. Hilton Lucky, RN    Called and spoke to patient  Patient states:   -he did wake up last night feeling like he was out of breath   -unsure what time it was when he woke up   -did not notice any heart palpitation   -he is more fatigue during the day   -becomes exhausted easily with activity   -has increased SOB   -has SOB when bending over to pick something up   -becomes tired after walking a few feet   -he continues to take Eliquis  5mg    -he is unsure if he is still taking Metoprolol  50mg , requests RN to call in 1 hour  Informed patient RN will follow up

## 2023-03-03 NOTE — Telephone Encounter (Signed)
 Patient identification verified by 2 forms. Edward Lucky, RN    Called and spoke to patient  Patient states he is taking Metoprolol  50mg  daily  Informed patient per Dr. Alvis Ba no changes at this time  Patient verbalized understanding, no questions at this time

## 2023-03-09 ENCOUNTER — Telehealth: Payer: Self-pay | Admitting: Cardiovascular Disease

## 2023-03-09 ENCOUNTER — Emergency Department (HOSPITAL_BASED_OUTPATIENT_CLINIC_OR_DEPARTMENT_OTHER): Payer: Medicare PPO | Admitting: Radiology

## 2023-03-09 ENCOUNTER — Encounter (HOSPITAL_BASED_OUTPATIENT_CLINIC_OR_DEPARTMENT_OTHER): Payer: Self-pay | Admitting: Emergency Medicine

## 2023-03-09 ENCOUNTER — Emergency Department (HOSPITAL_BASED_OUTPATIENT_CLINIC_OR_DEPARTMENT_OTHER)
Admission: EM | Admit: 2023-03-09 | Discharge: 2023-03-09 | Disposition: A | Discharge status: Home / Self Care | Payer: Medicare PPO | Attending: Emergency Medicine | Admitting: Emergency Medicine

## 2023-03-09 ENCOUNTER — Other Ambulatory Visit: Payer: Self-pay

## 2023-03-09 DIAGNOSIS — I5033 Acute on chronic diastolic (congestive) heart failure: Secondary | ICD-10-CM

## 2023-03-09 DIAGNOSIS — I483 Typical atrial flutter: Secondary | ICD-10-CM

## 2023-03-09 DIAGNOSIS — I110 Hypertensive heart disease with heart failure: Secondary | ICD-10-CM | POA: Insufficient documentation

## 2023-03-09 DIAGNOSIS — Z8673 Personal history of transient ischemic attack (TIA), and cerebral infarction without residual deficits: Secondary | ICD-10-CM | POA: Diagnosis not present

## 2023-03-09 DIAGNOSIS — R0602 Shortness of breath: Secondary | ICD-10-CM | POA: Diagnosis present

## 2023-03-09 DIAGNOSIS — Z79899 Other long term (current) drug therapy: Secondary | ICD-10-CM | POA: Diagnosis not present

## 2023-03-09 DIAGNOSIS — Z7901 Long term (current) use of anticoagulants: Secondary | ICD-10-CM | POA: Insufficient documentation

## 2023-03-09 DIAGNOSIS — I509 Heart failure, unspecified: Secondary | ICD-10-CM | POA: Diagnosis not present

## 2023-03-09 LAB — BASIC METABOLIC PANEL
Anion gap: 6 (ref 5–15)
BUN: 13 mg/dL (ref 8–23)
CO2: 27 mmol/L (ref 22–32)
Calcium: 9.2 mg/dL (ref 8.9–10.3)
Chloride: 107 mmol/L (ref 98–111)
Creatinine, Ser: 1.06 mg/dL (ref 0.61–1.24)
GFR, Estimated: >60 mL/min (ref >60)
Glucose, Bld: 75 mg/dL (ref 70–99)
Potassium: 4.2 mmol/L (ref 3.5–5.1)
Sodium: 140 mmol/L (ref 135–145)

## 2023-03-09 LAB — CBC
HCT: 39.9 % (ref 39.0–52.0)
Hemoglobin: 12.8 g/dL — ABNORMAL LOW (ref 13.0–17.0)
MCH: 32.4 pg (ref 26.0–34.0)
MCHC: 32.1 g/dL (ref 30.0–36.0)
MCV: 101 fL — ABNORMAL HIGH (ref 80.0–100.0)
Platelets: 198 10*3/uL (ref 150–400)
RBC: 3.95 MIL/uL — ABNORMAL LOW (ref 4.22–5.81)
RDW: 14.4 % (ref 11.5–15.5)
WBC: 5.7 10*3/uL (ref 4.0–10.5)
nRBC: 0 % (ref 0.0–0.2)

## 2023-03-09 LAB — TROPONIN I (HIGH SENSITIVITY): Troponin I (High Sensitivity): 9 ng/L (ref <18)

## 2023-03-09 LAB — BRAIN NATRIURETIC PEPTIDE: B Natriuretic Peptide: 490.9 pg/mL — ABNORMAL HIGH (ref 0.0–100.0)

## 2023-03-09 LAB — MAGNESIUM: Magnesium: 2 mg/dL (ref 1.7–2.4)

## 2023-03-09 MED ORDER — FUROSEMIDE 20 MG PO TABS: ORAL_TABLET | ORAL | 0 refills | Status: DC

## 2023-03-09 MED ORDER — FUROSEMIDE 10 MG/ML IJ SOLN
40.0000 mg | Freq: Once | INTRAMUSCULAR | Status: AC
  Administered 2023-03-09: 40 mg via INTRAVENOUS
  Filled 2023-03-09: qty 4

## 2023-03-09 NOTE — Telephone Encounter (Signed)
Pt c/o BP issue:  1. What are your last 5 BP readings? 179/107 this morning 2. Are you having any other symptoms (ex. Dizziness, headache, blurred vision, passed out)? Fatigue, Hr 43/44 3. What is your medication issue? Wants to discuss BP, Fatigue and low HR

## 2023-03-09 NOTE — ED Provider Notes (Signed)
[place] EMERGENCY DEPARTMENT AT [facility] Provider Note   CSN: [id number] Arrival date & time: 03/09/23  1532     History  Chief Complaint  Patient presents with   Shortness of Breath    Edward Zavala is a 78 y.o. male.  HPI     This is a 78 year old male with a history of hypertension, hyperlipidemia, TIA who presents with concern for shortness of breath.  Patient reports increased dyspnea on exertion and decreased tolerance with walking.  He recently had some EKGs that were concerning for possible new onset atrial fibrillation.  He followed up with cardiology after hospitalization in December where he had an EKG that was consistent with atrial fibrillation.  Patient states that over the last 12 days he has gained 14 pounds and has noted lower extremity edema.  He is not had any fevers or cough.  He states his tolerance for walking is greatly diminished.  He currently has a Holter monitor in place.  He is not currently on any diuretics as during his admission in December he was noted to have an AKI and dehydration.  He is on metoprolol.  Reports that his primary physician told him to stop his metoprolol yesterday because his heart rates have been so low.  Home Medications Prior to Admission medications   Medication Sig Start Date End Date Taking? Authorizing Provider  ALPRAZolam Prudy Feeler) 0.5 MG tablet Take 0.5 mg by mouth 2 (two) times daily as needed for anxiety.   Yes [provider]  apixaban (ELIQUIS) 5 MG TABS tablet Take 1 tablet (5 mg total) by mouth 2 (two) times daily. 02/15/23  Yes Croitoru, Mihai, MD  Armodafinil 250 MG tablet Take 250 mg by mouth daily.   Yes [provider]  atorvastatin (LIPITOR) 20 MG tablet TAKE ONE TABLET BY MOUTH DAILY 04/20/22  Yes Willow Ora, MD  buPROPion (WELLBUTRIN XL) 150 MG 24 hr tablet Take 150 mg by mouth 2 (two) times daily. 03/24/22  Yes [provider]  busPIRone (BUSPAR) 15 MG tablet Take 15 mg by  mouth daily. 03/16/17  Yes [provider]  butalbital-aspirin-caffeine Benny Lennert) 50-325-40 MG per capsule Take 1 capsule by mouth every 6 (six) hours as needed for headache. Headache   Yes [provider]  Erythromycin 250 MG TBEC Take 250 mg by mouth every evening. 10/30/22  Yes [provider]  furosemide (LASIX) 20 MG tablet Take 40 mg daily starting Wednesday for 3 days.  Then reduce to 20 mg daily. 03/09/23  Yes Berman Grainger, Mayer Masker, MD  lisinopril-hydrochlorothiazide (ZESTORETIC) 20-12.5 MG tablet Take 1 tablet by mouth daily. 02/14/23  Yes [provider]  metoprolol succinate (TOPROL-XL) 50 MG 24 hr tablet TAKE ONE TABLET BY MOUTH DAILY WITH OR IMMEDIATELY FOLLOWING A MEAL 03/18/22  Yes Willow Ora, MD  omeprazole (PRILOSEC) 20 MG capsule Take 20 mg by mouth daily.   Yes [provider]  traZODone (DESYREL) 50 MG tablet Take 50 mg by mouth at bedtime. 01/19/23  Yes [provider]  Dwyane Luo 200-62.5-25 MCG/ACT AEPB Inhale 1 puff into the lungs daily. 03/09/23  Yes [provider]  Vitamin D, Ergocalciferol, (DRISDOL) 1.25 MG (50000 UNIT) CAPS capsule Take 50,000 Units by mouth once a week. 12/26/22  Yes [provider]      Allergies    Patient has no known allergies.    Review of Systems   Review of Systems  Constitutional:  Negative for fever.  Respiratory:  Positive for shortness of breath.   Cardiovascular:  Positive for leg swelling. Negative for chest pain.  Gastrointestinal:  Negative for abdominal pain, nausea and vomiting.  All other systems reviewed and are negative.   Physical Exam Updated Vital Signs BP (!) 155/95 (BP Location: Right Arm)   Pulse (!) 44   Temp 98.1 F (36.7 C) (Oral)   Resp 20   Ht 1.727 m (5\' 8" )   Wt 85 kg   SpO2 99%   BMI 28.49 kg/m  Physical Exam Vitals and nursing note reviewed.  Constitutional:      Appearance: He is well-developed.  HENT:     Head:  Normocephalic and atraumatic.  Eyes:     Pupils: Pupils are equal, round, and reactive to light.  Cardiovascular:     Rate and Rhythm: Regular rhythm. Tachycardia present.     Heart sounds: Normal heart sounds. No murmur heard. Pulmonary:     Effort: Pulmonary effort is normal. No respiratory distress.     Breath sounds: Normal breath sounds. No wheezing.  Abdominal:     General: Bowel sounds are normal.     Palpations: Abdomen is soft.     Tenderness: There is no abdominal tenderness. There is no rebound.  Musculoskeletal:     Cervical back: Neck supple.     Right lower leg: Edema present.     Left lower leg: Edema present.     Comments: 2+ pitting edema bilaterally  Lymphadenopathy:     Cervical: No cervical adenopathy.  Skin:    General: Skin is warm and dry.  Neurological:     Mental Status: He is alert and oriented to person, place, and time.  Psychiatric:        Mood and Affect: Mood normal.     ED Results / Procedures / Treatments   Labs (all labs ordered are listed, but only abnormal results are displayed) Labs Reviewed  CBC - Abnormal; Notable for the following components:      Result Value   RBC 3.95 (*)    Hemoglobin 12.8 (*)    MCV 101.0 (*)    All other components within normal limits  BRAIN NATRIURETIC PEPTIDE - Abnormal; Notable for the following components:   B Natriuretic Peptide 490.9 (*)    All other components within normal limits  BASIC METABOLIC PANEL  MAGNESIUM  TROPONIN I (HIGH SENSITIVITY)  TROPONIN I (HIGH SENSITIVITY)    EKG EKG Interpretation Date/Time:  Tuesday March 09 2023 15:43:14 EST Ventricular Rate:  51 PR Interval:    QRS Duration:  80 QT Interval:  416 QTC Calculation: 383 R Axis:   27  Text Interpretation: Atrial flutter with variable A-V block Septal infarct , age undetermined Abnormal ECG When compared with ECG of 12-Feb-2023 08:31, Atrial flutter has replaced Sinus rhythm Septal infarct is now Present Criteria for  Inferior infarct are no longer Present Inverted T waves have replaced nonspecific T wave abnormality in Inferior leads Inverted T waves have replaced nonspecific T wave abnormality in Lateral leads QT has shortened Confirmed by Ross Marcus ([id number]) on 03/09/2023 6:31:06 PM  Radiology DG Chest 2 View Result Date: 03/09/2023 CLINICAL DATA:  Shortness of breath EXAM: CHEST - 2 VIEW COMPARISON:  X-ray 02/08/2023. FINDINGS: No consolidation, pneumothorax. Tiny pleural effusion suggested. No edema. Normal cardiopericardial silhouette. Overlapping artifact along the left hemithorax. Moderate degenerative changes of the spine. IMPRESSION: Tiny pleural effusions.  No frank consolidation or edema. Electronically Signed   By: Karen Kays  M.D.   On: 03/09/2023 17:15    Procedures .Critical Care  Performed by: Shon Baton, MD Authorized by: Shon Baton, MD   Critical care provider statement:    Critical care time (minutes):  31   Critical care was necessary to treat or prevent imminent or life-threatening deterioration of the following conditions:  Cardiac failure   Critical care was time spent personally by me on the following activities:  Development of treatment plan with patient or surrogate, discussions with consultants, evaluation of patient's response to treatment, examination of patient, ordering and review of laboratory studies, ordering and review of radiographic studies, ordering and performing treatments and interventions, pulse oximetry, re-evaluation of patient's condition and review of old charts     Medications Ordered in ED Medications  furosemide (LASIX) injection 40 mg (40 mg Intravenous Given 03/09/23 1855)    ED Course/ Medical Decision Making/ A&P                                 Medical Decision Making Amount and/or Complexity of Data Reviewed Labs: ordered. Radiology: ordered.  Risk Prescription drug management.   This patient presents to the ED for  concern of dyspnea on exertion, this involves an extensive number of treatment options, and is a complaint that carries with it a high risk of complications and morbidity.  I considered the following differential and admission for this acute, potentially life threatening condition.  The differential diagnosis includes heart failure, pneumonia, pneumothorax, atrial fibrillation/flutter symptomatic, ACS  MDM:    This is a 78 year old male who presents with concern for increasing dyspnea on exertion and fatigue.  Has been in slow atrial flutter and metoprolol was stopped by his primary physician yesterday.  He is in slow atrial flutter here.  He is not in any respiratory distress and satting 98% on room air.  He is not significantly tachypneic but does appear volume overloaded.  Last echocardiogram in the system shows normal systolic function but some diastolic dysfunction.  He was taken off his Lasix previously for dehydration and AKI.  EKG shows no evidence of acute arrhythmia.  Rates here have been in the 40s consistently.  Chest x-ray does show small bilateral pleural effusions.  BNP is elevated.  Troponin is negative.  Otherwise labs show that his kidney function has recovered.  He was given 1 dose of IV Lasix.  Patient reports that he is motivated to go home.  He was able to ambulate and maintain his pulse ox without any respiratory distress.  I discussed with him the importance of following up with cardiology regarding ongoing titration of metoprolol.  I would hold his dose later today given his ongoing bradycardia as I think this may be contributing to his heart failure symptoms.  Will start Lasix at 40 mg for the next 3 days and then reduce to 20 mg with close outpatient follow-up given history of renal dysfunction.  Patient is agreeable to plan.  Message sent to Dr. Salena Saner.  (Labs, imaging, consults)  Labs: I Ordered, and personally interpreted labs.  The pertinent results include: CBC, BMP, BNP,  troponin  Imaging Studies ordered: I ordered imaging studies including chest x-ray I independently visualized and interpreted imaging. I agree with the radiologist interpretation  Additional history obtained from chart review.  External records from outside source obtained and reviewed including radiology notes  Cardiac Monitoring: The patient was maintained on a cardiac monitor.  If on the cardiac monitor, I personally viewed and interpreted the cardiac monitored which showed an underlying rhythm of: Slow atrial flutter  Reevaluation: After the interventions noted above, I reevaluated the patient and found that they have :stayed the same  Social Determinants of Health:  lives independently  Disposition: discharge  Co morbidities that complicate the patient evaluation  Past Medical History:  Diagnosis Date   Anxiety    Depression    Dyslipidemia (high LDL; low HDL)    Gastroparesis    GERD (gastroesophageal reflux disease)    H/O acute pancreatitis    H/O gastroesophageal reflux (GERD)    Headache(784.0)    occasinal    Hyperlipidemia    Hypertension    Stroke (HCC)      Medicines Meds ordered this encounter  Medications   furosemide (LASIX) injection 40 mg   furosemide (LASIX) 20 MG tablet    Sig: Take 40 mg daily starting Wednesday for 3 days.  Then reduce to 20 mg daily.    Dispense:  30 tablet    Refill:  0    I have reviewed the patients home medicines and have made adjustments as needed  Problem List / ED Course: Problem List Items Addressed This Visit   None Visit Diagnoses       Typical atrial flutter (HCC)    -  Primary   Relevant Medications   lisinopril-hydrochlorothiazide (ZESTORETIC) 20-12.5 MG tablet   furosemide (LASIX) injection 40 mg (Completed)   furosemide (LASIX) 20 MG tablet     Acute on chronic diastolic heart failure (HCC)       Relevant Medications   lisinopril-hydrochlorothiazide (ZESTORETIC) 20-12.5 MG tablet   furosemide (LASIX)  injection 40 mg (Completed)   furosemide (LASIX) 20 MG tablet                   Final Clinical Impression(s) / ED Diagnoses Final diagnoses:  Typical atrial flutter (HCC)  Acute on chronic diastolic heart failure (HCC)    Rx / DC Orders ED Discharge Orders          Ordered    furosemide (LASIX) 20 MG tablet        03/09/23 1936              Shon Baton, MD 03/09/23 1944

## 2023-03-09 NOTE — Discharge Instructions (Signed)
You were seen today for shortness of breath.  This is likely related to some fluid on your lungs that is related to heart failure.  Start Lasix.  Take 40 mg daily starting on Wednesday for 3 days.  Then reduce to 20 mg daily.  Make sure that you follow-up with your cardiologist as soon as possible.  I have sent him a message.  If you develop any new or worsening symptoms, you should be reevaluated.

## 2023-03-09 NOTE — ED Notes (Signed)
AVS provided by edp was reviewed with pt. Pt verbalized understanding with no additional questions at this time. Pt verified pharmacy. Pt to go home with s/o at bedside.

## 2023-03-09 NOTE — Telephone Encounter (Signed)
Patient identification verified by 2 forms. Marilynn Rail, RN    Called and spoke to patient  Patient states:   -At 9:50am BP:164/99 HR: 46  -PCP saw him yesterday, advised him to follow up with Cardiology   -Over the weekend developed exhaustion and heavy breathing   -has to take frequent rest with ambulating   -PCP is concerned about high BP and low heart rates   -PCP discontinued Metoprolol   -has a bit chest pain, believes related to previous cough from flu   -continues to have persistent chest pain even without coughing  -he noticed increased swelling in his feet, started 1 week ago -Noticed 14lb weight gain over the course of 12 days   During call RN noted heavy breathing, patient stated that was simply from getting heart monitor, true heavy breathing occurs when walking short distances/upstairs  Advised patient to present to ED for evaluation  Patient agreeable, no further questions at this time

## 2023-03-09 NOTE — ED Notes (Signed)
RT Note: Patient oxygen saturation on room air =95% Patient tolerated walk well with no distress

## 2023-03-10 ENCOUNTER — Encounter: Payer: Self-pay | Admitting: Cardiovascular Disease

## 2023-03-15 NOTE — Telephone Encounter (Signed)
I swelling is all gone, please have him weigh himself daily in the morning. Stop furosemide, take it only on days when he weighs >3 lb above current weight. If the swelling has not resolved, take the furosemide daily until his follow up.

## 2023-03-26 ENCOUNTER — Ambulatory Visit: Payer: Medicare PPO | Attending: Cardiovascular Disease

## 2023-03-26 ENCOUNTER — Encounter: Payer: Self-pay | Admitting: Cardiovascular Disease

## 2023-03-26 DIAGNOSIS — I4891 Unspecified atrial fibrillation: Secondary | ICD-10-CM

## 2023-03-28 NOTE — Pre-Procedure Instructions (Signed)
 Spoke with patient regarding cardioversion procedure scheduled for Thursday 04/01/23 @ 1000.  Please arrive @ 0900, nothing to eat or drink after midnight.  Takes Eliquis  twice a day, hasn't missed any doses.  Aware he needs a responsible adult to drive him home and stay with him for 24 hours.

## 2023-03-31 NOTE — H&P (View-Only) (Signed)
Pt called for pre procedure instructions. Arrival time 0900 NPO after midnight explained Instructed to take am meds with sip of water and confirmed blood thinner consistency.  Reminded to take Eliquis in the morning and current BP med. Instructed pt need for ride home tomorrow and have responsible adult with them for 24 hrs post procedure.

## 2023-03-31 NOTE — Progress Notes (Signed)
Pt called for pre procedure instructions. Arrival time 0900 NPO after midnight explained Instructed to take am meds with sip of water and confirmed blood thinner consistency.  Reminded to take Eliquis in the morning and current BP med. Instructed pt need for ride home tomorrow and have responsible adult with them for 24 hrs post procedure.

## 2023-04-01 ENCOUNTER — Encounter (HOSPITAL_COMMUNITY)
Admission: RE | Disposition: A | Discharge status: Home / Self Care | Payer: Self-pay | Source: Home / Self Care | Attending: Cardiovascular Disease

## 2023-04-01 ENCOUNTER — Ambulatory Visit (HOSPITAL_COMMUNITY)
Admission: RE | Admit: 2023-04-01 | Discharge: 2023-04-01 | Disposition: A | Discharge status: Home / Self Care | Payer: Medicare PPO | Attending: Cardiovascular Disease | Admitting: Cardiovascular Disease

## 2023-04-01 ENCOUNTER — Encounter (HOSPITAL_COMMUNITY): Payer: Self-pay | Admitting: Cardiovascular Disease

## 2023-04-01 ENCOUNTER — Ambulatory Visit (HOSPITAL_COMMUNITY): Payer: Medicare PPO | Admitting: Anesthesiology

## 2023-04-01 ENCOUNTER — Other Ambulatory Visit: Payer: Self-pay

## 2023-04-01 DIAGNOSIS — F418 Other specified anxiety disorders: Secondary | ICD-10-CM | POA: Diagnosis not present

## 2023-04-01 DIAGNOSIS — Z87891 Personal history of nicotine dependence: Secondary | ICD-10-CM | POA: Insufficient documentation

## 2023-04-01 DIAGNOSIS — F32A Depression, unspecified: Secondary | ICD-10-CM | POA: Diagnosis not present

## 2023-04-01 DIAGNOSIS — I10 Essential (primary) hypertension: Secondary | ICD-10-CM | POA: Insufficient documentation

## 2023-04-01 DIAGNOSIS — Z7901 Long term (current) use of anticoagulants: Secondary | ICD-10-CM | POA: Insufficient documentation

## 2023-04-01 DIAGNOSIS — I4891 Unspecified atrial fibrillation: Secondary | ICD-10-CM | POA: Diagnosis present

## 2023-04-01 DIAGNOSIS — Z79899 Other long term (current) drug therapy: Secondary | ICD-10-CM | POA: Diagnosis not present

## 2023-04-01 DIAGNOSIS — F419 Anxiety disorder, unspecified: Secondary | ICD-10-CM | POA: Insufficient documentation

## 2023-04-01 DIAGNOSIS — K219 Gastro-esophageal reflux disease without esophagitis: Secondary | ICD-10-CM | POA: Insufficient documentation

## 2023-04-01 HISTORY — PX: CARDIOVERSION: EP1203

## 2023-04-01 LAB — GLUCOSE, CAPILLARY: Glucose-Capillary: 101 mg/dL — ABNORMAL HIGH (ref 70–99)

## 2023-04-01 SURGERY — CARDIOVERSION (CATH LAB)
Anesthesia: General

## 2023-04-01 MED ORDER — SODIUM CHLORIDE 0.9% FLUSH: 3.0000 mL | Freq: Two times a day (BID) | INTRAVENOUS | Status: DC

## 2023-04-01 MED ORDER — LIDOCAINE 2% (20 MG/ML) 5 ML SYRINGE
INTRAMUSCULAR | Status: DC | PRN
  Administered 2023-04-01: 100 mg via INTRAVENOUS

## 2023-04-01 MED ORDER — PROPOFOL 10 MG/ML IV BOLUS
INTRAVENOUS | Status: DC | PRN
  Administered 2023-04-01: 50 mg via INTRAVENOUS

## 2023-04-01 MED ORDER — SODIUM CHLORIDE 0.9% FLUSH: 3.0000 mL | INTRAVENOUS | Status: DC | PRN

## 2023-04-01 SURGICAL SUPPLY — 1 items: PAD DEFIB RADIO PHYSIO CONN (PAD) ×1 IMPLANT

## 2023-04-01 NOTE — CV Procedure (Signed)
Electrical Cardioversion Procedure Note Edward Zavala [id number] 1945-08-02  Procedure: Electrical Cardioversion Indications:  Atrial Fibrillation  Procedure Details Consent: Risks of procedure as well as the alternatives and risks of each were explained to the (patient/caregiver).  Consent for procedure obtained. Time Out: Verified patient identification, verified procedure, site/side was marked, verified correct patient position, special equipment/implants available, medications/allergies/relevent history reviewed, required imaging and test results available.  Performed  Patient placed on cardiac monitor, pulse oximetry, supplemental oxygen as necessary.  Sedation given:  propofol Pacer pads placed anterior and posterior chest.  Cardioverted 1 time(s).  Cardioverted at 200J.  Evaluation Findings: Post procedure EKG shows:  sinus rhythm with first degree AV block Complications: None Patient did tolerate procedure well.   Chilton Si, MD 04/01/2023, 11:12 AM

## 2023-04-01 NOTE — Transfer of Care (Signed)
Immediate Anesthesia Transfer of Care Note  Patient: Edward Zavala  Procedure(s) Performed: CARDIOVERSION  Patient Location: Cath Lab  Anesthesia Type:General  Level of Consciousness: drowsy and patient cooperative  Airway & Oxygen Therapy: Patient Spontanous Breathing and Patient connected to nasal cannula oxygen  Post-op Assessment: Report given to RN and Post -op Vital signs reviewed and stable  Post vital signs: Reviewed and stable  Last Vitals:  Vitals Value Taken Time  BP 160/110   Temp    Pulse 80   Resp 20   SpO2 95     Last Pain:  Vitals:   04/01/23 0909  TempSrc: Temporal  PainSc: 0-No pain         Complications: No notable events documented.

## 2023-04-01 NOTE — Interval H&P Note (Signed)
History and Physical Interval Note:  04/01/2023 10:34 AM  Edward Zavala  has presented today for surgery, with the diagnosis of afib.  The various methods of treatment have been discussed with the patient and family. After consideration of risks, benefits and other options for treatment, the patient has consented to  Procedure(s): CARDIOVERSION (N/A) as a surgical intervention.  The patient's history has been reviewed, patient examined, no change in status, stable for surgery.  I have reviewed the patient's chart and labs.  Questions were answered to the patient's satisfaction.     Chilton Si, MD

## 2023-04-01 NOTE — Anesthesia Postprocedure Evaluation (Signed)
Anesthesia Post Note  Patient: Edward Zavala  Procedure(s) Performed: CARDIOVERSION     Patient location during evaluation: PACU Anesthesia Type: General Level of consciousness: sedated and patient cooperative Pain management: pain level controlled Vital Signs Assessment: post-procedure vital signs reviewed and stable Respiratory status: spontaneous breathing Cardiovascular status: stable Anesthetic complications: no   No notable events documented.  Last Vitals:  Vitals:   04/01/23 0909 04/01/23 1116  BP: (!) 153/95   Pulse: 63   Resp: 12   Temp: 36.7 C 36.8 C  SpO2: 97%     Last Pain:  Vitals:   04/01/23 1116  TempSrc: Temporal  PainSc: 0-No pain                 Lewie Loron

## 2023-04-01 NOTE — Anesthesia Preprocedure Evaluation (Addendum)
Anesthesia Evaluation  Patient identified by MRN, date of birth, ID band Patient awake    Reviewed: Allergy & Precautions, NPO status , Patient's Chart, lab work & pertinent test results  History of Anesthesia Complications Negative for: history of anesthetic complications  Airway Mallampati: I  TM Distance: >3 FB Neck ROM: Full    Dental no notable dental hx. (+) Dental Advisory Given   Pulmonary former smoker 08/04/2019 SARS coronavirus NEG   Pulmonary exam normal breath sounds clear to auscultation       Cardiovascular hypertension, Pt. on medications (-) angina Normal cardiovascular exam Rhythm:Regular Rate:Normal  Echo 01/2023  1. Normal LVEF, 60-65%. MV tissue doppler normal but E/A >2.0. E/e' normal. LA mildly dilated. TR velocity normal. Diastolic parameters are indeterminate based on diagnostic criteria. RV noted to be moderate dilated, raising concern for a right heart  issue to explain symptoms. Suspect an element of diastolic dysfunction as well given abnormal E/A ratio. Left ventricular ejection fraction, by estimation, is 60 to 65%. The left ventricle has normal function. The left ventricle has no regional wall motion abnormalities. Left ventricular diastolic parameters are indeterminate.   2. Right ventricular systolic function is mildly reduced. The right ventricular size is moderately enlarged. Tricuspid regurgitation signal is inadequate for assessing PA pressure. The estimated right ventricular systolic pressure is 25.7 mmHg.   3. Left atrial size was mildly dilated.   4. The mitral valve is grossly normal. Trivial mitral valve regurgitation. No evidence of mitral stenosis.   5. The aortic valve is tricuspid. Aortic valve regurgitation is not visualized. No aortic stenosis is present.   6. The inferior vena cava is normal in size with greater than 50% respiratory variability, suggesting right atrial pressure of 3 mmHg.    '19 ECHO: EF 60-65%, mild LVH. Systolic function was normal, EF 60% to 65%, valves OK   Neuro/Psych  Headaches PSYCHIATRIC DISORDERS Anxiety Depression    TIACVA    GI/Hepatic Neg liver ROS,GERD  Medicated and Controlled,,Abnormal x ray of pancreas   Endo/Other  negative endocrine ROS    Renal/GU Renal disease     Musculoskeletal  (+) Arthritis ,    Abdominal  (+) + obese  Peds  Hematology negative hematology ROS (+)   Anesthesia Other Findings   Reproductive/Obstetrics                             Anesthesia Physical Anesthesia Plan  ASA: 3  Anesthesia Plan: General   Post-op Pain Management: Minimal or no pain anticipated   Induction:   PONV Risk Score and Plan: 2 and TIVA, Propofol infusion and Treatment may vary due to age or medical condition  Airway Management Planned: Natural Airway  Additional Equipment: None  Intra-op Plan:   Post-operative Plan:   Informed Consent: I have reviewed the patients History and Physical, chart, labs and discussed the procedure including the risks, benefits and alternatives for the proposed anesthesia with the patient or authorized representative who has indicated his/her understanding and acceptance.     Dental advisory given  Plan Discussed with: CRNA  Anesthesia Plan Comments:         Anesthesia Quick Evaluation

## 2023-04-01 NOTE — Discharge Instructions (Signed)

## 2023-04-12 ENCOUNTER — Other Ambulatory Visit: Payer: Self-pay | Admitting: Family Medicine

## 2023-04-13 ENCOUNTER — Encounter: Payer: Self-pay | Admitting: Cardiovascular Disease

## 2023-04-13 NOTE — Telephone Encounter (Signed)
 Per Dr Royann Shivers 's message to patient through mychart  Metoprolol 50 mg  discontinue off patient medication lists

## 2023-04-14 ENCOUNTER — Institutional Professional Consult (permissible substitution): Payer: Medicare PPO | Admitting: Cardiovascular Disease

## 2023-04-26 NOTE — Progress Notes (Signed)
 Cardiology Office Note:    Date:  05/02/2023   ID:  Edward Zavala, DOB 1945-12-12, MRN [id number]  PCP:  Dois Davenport, MD   [facility] Providers Cardiologist:  Thurmon Fair, MD     Referring MD: Dois Davenport, MD   Chief Complaint  Patient presents with   Atrial Fibrillation    History of Present Illness:    Edward Zavala is a 78 y.o. male with a hx of dyslipidemia, hypertension, previous "mini stroke", recently diagnosed persistent atrial fibrillation with slow ventricular rates. He is never aware of palpitations, but has had fatigue.  He had a successful DC cardioversion with a single shock 04/01/2023.  He noticed increased energy levels for about a day, after which his fatigue returned.  On 02/25 he began monitoring his rhythm with a Kardia device and found he was back in atrial fibrillation. He has had a cough.  He denies dizziness or syncope.  He has not had problems with daytime hypersomnolence.  He does not have a history of falls or easy bleeding.  Prior to cardioversion his event monitor in January showed persistent atrial flutter with slow ventricular rates.  Including several pauses over 3 seconds, up to maximum 4.2 seconds in duration.  Most of the pauses occurred during sleep hours, but some of them occur during the day as well.  Rapid ventricular rates were very infrequent.  His symptoms generally correlated with periods of slow ventricular rates.  His echocardiogram in December 2024 suggested moderate biventricular argument with mild RV dysfunction, but with normal left ventricular ejection fraction.  He was in the honest rhythm at the time of his echocardiogram in December 2024.  In March 2019 he presented with vertigo and gait instability.  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  It was not clear whether this was due to thromboembolism or dissection.  CT angiogram of the neck showed no evidence of stenosis or  plaque in the carotid circulation or the contralateral vertebral artery.  Echo cardiogram showed normal LV function with mild LVH and a normal-sized left atrium, no valvular abnormalities.  He was started on dual antiplatelet therapy with aspirin and clopidogrel and was given atorvastatin for an elevated LDL cholesterol.  No arrhythmia was detected during that hospital stay.  In March 2023 he was seen in the office in consultation by Dr. [place] for dyspnea on exertion.  The symptoms were transient.  His ECG showed normal sinus rhythm with PACs.  He had a nuclear stress test that showed normal perfusion and LVEF 68%.  Of note, he was unable to reach target heart rate on the treadmill and therefore he had a pharmacological stress test.   Past Medical History:  Diagnosis Date   Anxiety    Depression    Dyslipidemia (high LDL; low HDL)    Gastroparesis    GERD (gastroesophageal reflux disease)    H/O acute pancreatitis    H/O gastroesophageal reflux (GERD)    Headache(784.0)    occasinal    Hyperlipidemia    Hypertension    Stroke [facility])     Past Surgical History:  Procedure Laterality Date   CARDIOVERSION N/A 04/01/2023   Procedure: CARDIOVERSION;  Surgeon: Chilton Si, MD;  Location: [facility] INVASIVE CV LAB;  Service: Cardiovascular;  Laterality: N/A;   CHOLECYSTECTOMY     ESOPHAGOGASTRIC FUNDOPLICATION     ESOPHAGOGASTRODUODENOSCOPY (EGD) WITH PROPOFOL N/A 08/08/2019   Procedure: ESOPHAGOGASTRODUODENOSCOPY (EGD) WITH  PROPOFOL;  Surgeon: Willis Modena, MD;  Location: [facility];  Service: Endoscopy;  Laterality: N/A;   EUS N/A 08/08/2019   Procedure: UPPER ENDOSCOPIC ULTRASOUND (EUS) LINEAR;  Surgeon: Willis Modena, MD;  Location: [facility] ENDOSCOPY;  Service: Endoscopy;  Laterality: N/A;  FNA   EYE SURGERY     age 60 months old    FINE NEEDLE ASPIRATION N/A 08/08/2019   Procedure: FINE NEEDLE ASPIRATION (FNA) LINEAR;  Surgeon: Willis Modena, MD;  Location: [facility] ENDOSCOPY;  Service:  Endoscopy;  Laterality: N/A;   HERNIA REPAIR     Port Site Hernia   LAPAROSCOPIC NISSEN FUNDOPLICATION     VENTRAL HERNIA REPAIR  12/01/2011   Procedure: LAPAROSCOPIC VENTRAL HERNIA;  Surgeon: Mariella Saa, MD;  Location: [facility] ORS;  Service: General;  Laterality: N/A;  x2  with mesh    Current Medications: Current Meds  Medication Sig   apixaban (ELIQUIS) 5 MG TABS tablet Take 1 tablet (5 mg total) by mouth 2 (two) times daily.   Armodafinil 250 MG tablet Take 250 mg by mouth daily.   atorvastatin (LIPITOR) 20 MG tablet TAKE 1 TABLET BY MOUTH DAILY   buPROPion (WELLBUTRIN XL) 150 MG 24 hr tablet Take 150 mg by mouth 2 (two) times daily.   busPIRone (BUSPAR) 15 MG tablet Take 15 mg by mouth daily.   Erythromycin 250 MG TBEC Take 250 mg by mouth every evening.   losartan-hydrochlorothiazide (HYZAAR) 100-12.5 MG tablet Take 1 tablet by mouth daily.   omeprazole (PRILOSEC) 20 MG capsule Take 20 mg by mouth daily.   Vitamin D, Ergocalciferol, (DRISDOL) 1.25 MG (50000 UNIT) CAPS capsule Take 50,000 Units by mouth once a week.   [DISCONTINUED] losartan (COZAAR) 100 MG tablet Take 100 mg by mouth daily.     Allergies:   Patient has no known allergies.   Family History: The patient's family history includes Alcohol abuse in his father; Asthma in his son; Cancer (age of onset: 42) in his mother; Depression in his daughter, mother, and son; Early death in his maternal grandmother; Heart attack in his maternal grandmother; High blood pressure in his daughter and mother.  EKGs/Labs/Other Studies Reviewed:    The following studies were reviewed today: Echocardiogram 02/15/2023   1. Normal LVEF, 60-65%. MV tissue doppler normal but E/A >2.0. E/e'  normal. LA mildly dilated. TR velocity normal. Diastolic parameters are  indeterminate based on diagnostic criteria. RV noted to be moderate  dilated, raising concern for a right heart  issue to explain symptoms. Suspect an element of diastolic  dysfunction as  well given abnormal E/A ratio. Left ventricular ejection fraction, by  estimation, is 60 to 65%. The left ventricle has normal function. The left  ventricle has no regional wall  motion abnormalities. Left ventricular diastolic parameters are  indeterminate.   2. Right ventricular systolic function is mildly reduced. The right  ventricular size is moderately enlarged. Tricuspid regurgitation signal is  inadequate for assessing PA pressure. The estimated right ventricular  systolic pressure is 25.7 mmHg.   3. Left atrial size was mildly dilated.   4. The mitral valve is grossly normal. Trivial mitral valve  regurgitation. No evidence of mitral stenosis.   5. The aortic valve is tricuspid. Aortic valve regurgitation is not  visualized. No aortic stenosis is present.   6. The inferior vena cava is normal in size with greater than 50%  respiratory variability, suggesting right atrial pressure of 3 mmHg.       Recent Labs: 02/08/2023:  ALT 29; TSH 1.581 03/09/2023: B Natriuretic Peptide 490.9; BUN 13; Creatinine, Ser 1.06; Hemoglobin 12.8; Magnesium 2.0; Platelets 198; Potassium 4.2; Sodium 140  Recent Lipid Panel    Component Value Date/Time   CHOL 147 04/02/2021 1647   TRIG 145.0 04/02/2021 1647   HDL 44.40 04/02/2021 1647   CHOLHDL 3 04/02/2021 1647   VLDL 29.0 04/02/2021 1647   LDLCALC 74 04/02/2021 1647     Risk Assessment/Calculations:    CHA2DS2-VASc Score = 5   This indicates a 7.2% annual risk of stroke. The patient's score is based upon: CHF History: 0 HTN History: 1 Diabetes History: 0 Stroke History: 2 Vascular Disease History: 0 Age Score: 2 Gender Score: 0     HYPERTENSION CONTROL Vitals:   04/27/23 0913 05/02/23 1356 05/02/23 1357  BP: (!) 142/86 (!) 143/85 (!) 143/85    The patient's blood pressure is elevated above target today.  In order to address the patient's elevated BP: A new medication was prescribed today.             Physical Exam:    VS:  BP (!) 143/85   Pulse (!) 56   Ht 5\' 8"  (1.727 m)   Wt 187 lb 9.6 oz (85.1 kg)   SpO2 97%   BMI 28.52 kg/m     Wt Readings from Last 3 Encounters:  04/27/23 187 lb 9.6 oz (85.1 kg)  03/09/23 187 lb 6.3 oz (85 kg)  02/12/23 188 lb 12.8 oz (85.6 kg)      General: Alert, oriented x3, no distress, appears well Head: no evidence of trauma, PERRL, EOMI, no exophtalmos or lid lag, no myxedema, no xanthelasma; normal ears, nose and oropharynx Neck: Minimally elevated jugular venous pulsations and no hepatojugular reflux; brisk carotid pulses without delay and no carotid bruits Chest: clear to auscultation, no signs of consolidation by percussion or palpation, normal fremitus, symmetrical and full respiratory excursions Cardiovascular: normal position and quality of the apical impulse, regular rhythm, normal first and second heart sounds, no murmurs, rubs or gallops Abdomen: no tenderness or distention, no masses by palpation, no abnormal pulsatility or arterial bruits, normal bowel sounds, no hepatosplenomegaly Extremities: no clubbing, cyanosis or edema; 2+ radial, ulnar and brachial pulses bilaterally; 2+ right femoral, posterior tibial and dorsalis pedis pulses; 2+ left femoral, posterior tibial and dorsalis pedis pulses; no subclavian or femoral bruits Neurological: grossly nonfocal Psych: Normal mood and affect   ASSESSMENT:    1. Atrial fibrillation with slow ventricular response (HCC)   2. Acquired thrombophilia (HCC)   3. Chronic diastolic heart failure (HCC)   4. OSA (obstructive sleep apnea)   5. Essential hypertension     PLAN:    In order of problems listed above:  AFib: He had early recurrence of atrial fibrillation following his cardioversion.  There was brief symptomatic improvement when he had normal rhythm.  He has spontaneous rate control and even periods of slow ventricular response on his previous arrhythmia monitor, so he is not on any  rate control medications at this time.  He is appropriately anticoagulated.  We reviewed the pros and cons of antiarrhythmic therapy and I told him that I think he is best suited for early ablation strategy.  He does not have much in the way of structural heart abnormalities and his left atrium is only mildly-moderately dilated (end-systolic diameter 4.6 cm, end-systolic volume index 34.5 mm/m body surface area; the right atrium was described as normal in size but is also mildly dilated  based on measurements.).  Made a referral to EP. Anticoagulation: Tolerating Eliquis well without bleeding problems.  He has not had any falls. CHF: Echo shows preserved LVEF.  Hard to assess diastolic function accurately since he is probably in and out of atrial arrhythmia and there may be some component of atrial stunning that explains he is markedly elevated E:A ratio. E/e' was not elevated.  The BNP was very slightly elevated at 173 in December.  The right ventricle was enlarged and insufficiently treated sleep apnea may be a driver for his arrhythmia. OSA: He is on CPAP with good compliance with therapy over the last 6 months.  Had normal spirometry 04/14/2022 at [facility].  She denies daytime hypersomnolence. HTN: Blood pressure is slightly high today and has been slightly elevated at home as well.  He was previously well-controlled on lisinopril-hydrochlorothiazide.  He presented with acute kidney injury and lisinopril-hydrochlorothiazide was discontinued. He is now on losartan without the thiazide diuretic.    Will add back a low-dose of hydrochlorothiazide. AKI: Resolved completely, most recent creatinine 1.06 in January 2025 Mild leukopenia/thrombocytopenia: Recent abnormalities were most likely due to acute viral syndrome with bone marrow suppression.  They have resolved.          Medication Adjustments/Labs and Tests Ordered: Current medicines are reviewed at length with the patient today.  Concerns  regarding medicines are outlined above.  Orders Placed This Encounter  Procedures   Ambulatory referral to Cardiac Electrophysiology   Meds ordered this encounter  Medications   losartan-hydrochlorothiazide (HYZAAR) 100-12.5 MG tablet    Sig: Take 1 tablet by mouth daily.    Dispense:  90 tablet    Refill:  3    Patient Instructions  Medication Instructions:  Your physician has recommended you make the following change in your medication:  START: Losartan-Hydrochlorothiazide 100-12.5 mg once daily  *If you need a refill on your cardiac medications before your next appointment, please call your pharmacy*   Follow-Up: At [facility], you and your health needs are our priority.  As part of our continuing mission to provide you with exceptional heart care, we have created designated Provider Care Teams.  These Care Teams include your primary Cardiologist (physician) and Advanced Practice Providers (APPs -  Physician Assistants and Nurse Practitioners) who all work together to provide you with the care you need, when you need it.   Your next appointment:   3-4 month(s)  Provider:   Thurmon Fair, MD     Other Instructions Please see one of our EP providers at our [address] office.    Signed, Thurmon Fair, MD  05/02/2023 1:57 PM    [facility]

## 2023-04-27 ENCOUNTER — Encounter: Payer: Self-pay | Admitting: Cardiovascular Disease

## 2023-04-27 ENCOUNTER — Ambulatory Visit: Payer: Medicare PPO | Attending: Cardiovascular Disease | Admitting: Cardiovascular Disease

## 2023-04-27 VITALS — BP 143/85 | HR 56 | Ht 68.0 in | Wt 187.6 lb

## 2023-04-27 DIAGNOSIS — D6869 Other thrombophilia: Secondary | ICD-10-CM

## 2023-04-27 DIAGNOSIS — I4891 Unspecified atrial fibrillation: Secondary | ICD-10-CM

## 2023-04-27 DIAGNOSIS — I5032 Chronic diastolic (congestive) heart failure: Secondary | ICD-10-CM

## 2023-04-27 DIAGNOSIS — I10 Essential (primary) hypertension: Secondary | ICD-10-CM

## 2023-04-27 DIAGNOSIS — G4733 Obstructive sleep apnea (adult) (pediatric): Secondary | ICD-10-CM

## 2023-04-27 MED ORDER — LOSARTAN POTASSIUM-HCTZ 100-12.5 MG PO TABS: 1.0000 | ORAL_TABLET | Freq: Every day | ORAL | 3 refills | Status: AC

## 2023-04-27 NOTE — Patient Instructions (Signed)
 Medication Instructions:  Your physician has recommended you make the following change in your medication:  START: Losartan-Hydrochlorothiazide 100-12.5 mg once daily  *If you need a refill on your cardiac medications before your next appointment, please call your pharmacy*   Follow-Up: At [facility], you and your health needs are our priority.  As part of our continuing mission to provide you with exceptional heart care, we have created designated Provider Care Teams.  These Care Teams include your primary Cardiologist (physician) and Advanced Practice Providers (APPs -  Physician Assistants and Nurse Practitioners) who all work together to provide you with the care you need, when you need it.   Your next appointment:   3-4 month(s)  Provider:   Thurmon Fair, MD     Other Instructions Please see one of our EP providers at our [address] office.

## 2023-05-02 ENCOUNTER — Encounter: Payer: Self-pay | Admitting: Cardiovascular Disease

## 2023-05-05 NOTE — Progress Notes (Unsigned)
 Electrophysiology Office Note:    Date:  05/06/2023   ID:  Edward Zavala, DOB [birth year], MRN [id number]  [facility] Cardiologist:  Edward Fair, MD  [facility] Electrophysiologist:  None   Referring MD: Edward Fair, MD   Chief Complaint: Atrial fibrillation  History of Present Illness:    Edward Zavala is a 78 year old man who I am seeing today for an evaluation of atrial fibrillation at the request of Edward Zavala.  The patient last saw Edward Zavala April 27, 2023.  His medical history includes stroke, hypertension, hyperlipidemia, GERD.  The patient had a cardioversion for his atrial fibrillation on April 01, 2023.  He felt improved energy levels following the cardioversion but then returned to atrial fibrillation shortly thereafter with return of fatigue.  He has monitored his rhythm at home using a [facility] devices.  He is being referred to discuss catheter ablation for his atrial fibrillation.  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  He confirms feeling better when in sinus rhythm.  He has done some reading already about catheter ablation.     Their past medical, social and family history was reviewed.   ROS:   Please see the history of present illness.    All other systems reviewed and are negative.  EKGs/Labs/Other Studies Reviewed:    The following studies were reviewed today:  February 15, 2023 echo EF normal, 60%.  RV mildly reduced.  Mildly dilated left atrium.  Trivial MR.  April 01, 2023 EKG shows sinus rhythm  March 09, 2023 EKG shows low amplitude atrial flutter waves with controlled ventricular rate  February 12, 2023 EKG shows sinus rhythm with first-degree AV delay  February 08, 2023 EKG shows atrial fibrillation    EKG Interpretation Date/Time:  Thursday May 06 2023 09:31:09 EDT Ventricular Rate:  65 PR Interval:    QRS Duration:  78 QT Interval:  400 QTC  Calculation: 416 R Axis:   9  Text Interpretation: Atrial fibrillation Confirmed by Edward Zavala [phone]) on 05/06/2023 9:35:18 AM    Physical Exam:    VS:  BP (!) 140/96 (BP Location: Left Arm, Patient Position: Sitting, Cuff Size: Normal)   Pulse 63   Ht 5\' 8"  (1.727 m)   Wt 189 lb 12.8 oz (86.1 kg)   SpO2 96%   BMI 28.86 kg/m     Wt Readings from Last 3 Encounters:  05/06/23 189 lb 12.8 oz (86.1 kg)  04/27/23 187 lb 9.6 oz (85.1 kg)  03/09/23 187 lb 6.3 oz (85 kg)     GEN: no distress CARD: Irregularly irregular, No MRG RESP: No IWOB. CTAB.        ASSESSMENT AND PLAN:    1. Atrial flutter, unspecified type (HCC)   2. Persistent atrial fibrillation (HCC)     #Persistent atrial fibrillation #Atrial flutter The patient has symptomatic atrial arrhythmias. He is on Eliquis for stroke prophylaxis given elevated CHA2DS2-VASc.  I have discussed rhythm control options with the patient during today's clinic appointment including antiarrhythmic drugs and catheter ablation.  He is interested in pursuing catheter ablation which I think is very reasonable.  I have discussed the procedural details including the risks, recovery and likelihood of success and he wishes to proceed.  Discussed treatment options today for AF including antiarrhythmic drug therapy and ablation. Discussed risks, recovery and likelihood of success with each treatment strategy. Risk, benefits, and alternatives to EP study and ablation for afib were  discussed. These risks include but are not limited to stroke, bleeding, vascular damage, tamponade, perforation, damage to the esophagus, lungs, phrenic nerve and other structures, pulmonary vein stenosis, worsening renal function, coronary vasospasm and death.  Discussed potential need for repeat ablation procedures and antiarrhythmic drugs after an initial ablation. The patient understands these risk and wishes to proceed.  We will therefore proceed with catheter  ablation at the next available time.  Carto, ICE, anesthesia are requested for the procedure.  Will also obtain CT PV protocol prior to the procedure to exclude LAA thrombus and further evaluate atrial anatomy.  Ablation strategy would be PVI plus posterior wall plus CTI.    Signed, Edward Zavala. Edward Brothers, MD, [facility], [facility] 05/06/2023 9:48 AM    Electrophysiology [facility]

## 2023-05-06 ENCOUNTER — Other Ambulatory Visit: Payer: Self-pay

## 2023-05-06 ENCOUNTER — Ambulatory Visit: Attending: Cardiology | Admitting: Cardiology

## 2023-05-06 ENCOUNTER — Encounter: Payer: Self-pay | Admitting: Cardiology

## 2023-05-06 VITALS — BP 140/96 | HR 63 | Ht 68.0 in | Wt 189.8 lb

## 2023-05-06 DIAGNOSIS — I4892 Unspecified atrial flutter: Secondary | ICD-10-CM

## 2023-05-06 DIAGNOSIS — I4819 Other persistent atrial fibrillation: Secondary | ICD-10-CM | POA: Diagnosis not present

## 2023-05-06 NOTE — Patient Instructions (Addendum)
 Medication Instructions:  Your physician recommends that you continue on your current medications as directed. Please refer to the Current Medication list given to you today.  *If you need a refill on your cardiac medications before your next appointment, please call your pharmacy*  Lab Work: BMET and CBC - you can go to any [facility] location to have these drawn within 30 days of your procedure (after April 9th)  Testing/Procedures: Cardiac CT Your physician has requested that you have cardiac CT. Cardiac computed tomography (CT) is a painless test that uses an x-ray machine to take clear, detailed pictures of your heart. For further information please visit [email or web address]   Ablation  Your physician has recommended that you have an ablation. Catheter ablation is a medical procedure used to treat some cardiac arrhythmias (irregular heartbeats). During catheter ablation, a long, thin, flexible tube is put into a blood vessel in your groin (upper thigh), or neck. This tube is called an ablation catheter. It is then guided to your heart through the blood vessel. Radio frequency waves destroy small areas of heart tissue where abnormal heartbeats may cause an arrhythmia to start.  You are scheduled for Atrial Fibrillation Ablation on Friday, May 9 with Dr. Steffanie Dunn.Please arrive at the Main Entrance A at [facility]: [address] [place], [place] [address] at 5:30 AM     Follow-Up: At [facility], you and your health needs are our priority.  As part of our continuing mission to provide you with exceptional heart care, we have created designated Provider Care Teams.  These Care Teams include your primary Cardiologist (physician) and Advanced Practice Providers (APPs -  Physician Assistants and Nurse Practitioners) who all work together to provide you with the care you need, when you need it.  Your next appointment:   We will call you to schedule your follow up  appointments.   You may go to any of these [facility] locations:   [facility] - [address] ([facility]) - [address] [phone] [address]   [place] - [address]    [place]  - [address]    [place] - [address] - [address] [employer]  - [address] ([facility])     - [address]     1st Floor: - Lobby - Registration  - Pharmacy  - Lab - Cafe  2nd Floor: - PV Lab - Diagnostic Testing (echo, CT, nuclear med)  3rd Floor: - Vacant  4th Floor: - TCTS (cardiothoracic surgery) - AFib Clinic - Structural Heart Clinic - Vascular Surgery  - Vascular Ultrasound  5th Floor: - HeartCare Cardiology (general and EP) - Clinical Pharmacy for coumadin, hypertension, lipid, weight-loss medications, and med management appointments    Valet parking services will be available as well.

## 2023-06-01 LAB — LAB REPORT - SCANNED
A1c: 5.8
Albumin, Urine POC: 0.4
Albumin/Creatinine Ratio, Urine, POC: 6
Creatinine, POC: 70 mg/dL
EGFR: 57
TSH: 1.56 (ref 0.41–5.90)

## 2023-06-02 ENCOUNTER — Telehealth (HOSPITAL_COMMUNITY): Payer: Self-pay

## 2023-06-02 NOTE — Telephone Encounter (Signed)
 Spoke with patient to complete pre-procedure call.     New medical conditions?  No Recent hospitalizations or surgeries? No Started any new medications? No Patient made aware to contact office to inform of any new medications started. Any changes in activities of daily living? No  Pre-procedure testing scheduled: CT on 06/04/23 and lab work ordered. Patient reports he had lab work drawn at PCP office on 4/14. I will reach out to provider office to inquire if the labs needed were collected and if so, faxed to [facility].   Confirmed patient is taking Eliquis and will continue taking medication before procedure or it may need to be rescheduled.  Confirmed patient is scheduled for Atrial Fibrillation Ablation on Friday, May 9 with Dr. Harvie Liner. Instructed patient to arrive at the Main Entrance A at [facility]: [address] [place], [place] [address] and check in at Admitting at 5:30 AM  Advised of plan to go home the same day and will only stay overnight if medically necessary. You MUST have a responsible adult to drive you home and MUST be with you the first 24 hours after you arrive home or your procedure could be cancelled.  Patient verbalized understanding to information provided and is agreeable to proceed with procedure.

## 2023-06-03 NOTE — Telephone Encounter (Signed)
 Spoke with Ranetta at [facility] Medicine/PCP office to request if recent labs of CBC, BMP or CMP completed after 05/26/23, to be faxed to office. Ranetta stated she will send request to clinical team.

## 2023-06-04 ENCOUNTER — Ambulatory Visit (HOSPITAL_COMMUNITY)
Admission: RE | Admit: 2023-06-04 | Discharge: 2023-06-04 | Disposition: A | Discharge status: Home / Self Care | Source: Ambulatory Visit | Attending: Cardiology | Admitting: Cardiology

## 2023-06-04 DIAGNOSIS — I4819 Other persistent atrial fibrillation: Secondary | ICD-10-CM | POA: Insufficient documentation

## 2023-06-04 DIAGNOSIS — I4892 Unspecified atrial flutter: Secondary | ICD-10-CM | POA: Insufficient documentation

## 2023-06-04 MED ORDER — IOHEXOL 350 MG/ML SOLN
100.0000 mL | Freq: Once | INTRAVENOUS | Status: AC | PRN
  Administered 2023-06-04: 100 mL via INTRAVENOUS

## 2023-06-18 ENCOUNTER — Telehealth (HOSPITAL_COMMUNITY): Payer: Self-pay

## 2023-06-18 NOTE — Telephone Encounter (Signed)
 Call placed to [facility]. Left detailed message regarding repeat request for patient's recent labs to be faxed. [facility] fax number provided and requested a return call to verify completion of request.

## 2023-06-18 NOTE — Telephone Encounter (Signed)
 Call placed to patient to discuss upcoming procedure.   CT: completed.  Labs: completed at PCP office on 4/14. No results have been received as of yet.  Any recent signs of acute illness or been started on antibiotics? No Any new medications started? No Any medications to hold? No Any missed doses of blood thinner?  No Advised patient to continue taking ANTICOAGULANT: Eliquis  (Apixaban ) twice daily without missing any doses.  Medication instructions:  On the morning of your procedure DO NOT take any medication., including Eliquis  or the procedure may be rescheduled. Nothing to eat or drink after midnight prior to your procedure.  Confirmed patient is scheduled for Atrial Fibrillation Ablation on Friday, May 9 with Dr. Harvie Liner. Instructed patient to arrive at the Main Entrance A at [facility]: [address] [place], [place] [address] and check in at Admitting at 5:30 AM.  Advised of plan to go home the same day and will only stay overnight if medically necessary. You MUST have a responsible adult to drive you home and MUST be with you the first 24 hours after you arrive home or your procedure could be cancelled.  Patient verbalized understanding to all instructions provided and agreed to proceed with procedure.

## 2023-06-24 NOTE — Pre-Procedure Instructions (Signed)
 Attempted to call patient regarding procedure instructions.  Left voicemail on the following items: Arrival time 0515 Nothing to eat or drink after midnight No meds AM of procedure Responsible person to drive you home and stay with you for 24 hrs  Have you missed any doses of anti-coagulant Eliquis- should be taken twice a day, if you have missed any doses please let us know.  Don't take dose morning of procedure.

## 2023-06-24 NOTE — Telephone Encounter (Signed)
 Call placed to [facility] Medicine to inquire about the previous request for patient's lab work. Spoke with Zach who confirmed patient had a CBC and CMP on 4/15 but lab work has not been signed off, which is required prior to faxing out. He stated, he will send information to clinical to have addressed.

## 2023-06-25 ENCOUNTER — Ambulatory Visit (HOSPITAL_COMMUNITY): Payer: Self-pay | Admitting: Certified Registered"

## 2023-06-25 ENCOUNTER — Other Ambulatory Visit (HOSPITAL_COMMUNITY): Payer: Self-pay

## 2023-06-25 ENCOUNTER — Encounter (HOSPITAL_COMMUNITY)
Admission: RE | Disposition: A | Discharge status: Home / Self Care | Payer: Self-pay | Source: Home / Self Care | Attending: Cardiology

## 2023-06-25 ENCOUNTER — Other Ambulatory Visit: Payer: Self-pay

## 2023-06-25 ENCOUNTER — Ambulatory Visit (HOSPITAL_COMMUNITY)
Admission: RE | Admit: 2023-06-25 | Discharge: 2023-06-25 | Disposition: A | Discharge status: Home / Self Care | Attending: Cardiology | Admitting: Cardiology

## 2023-06-25 ENCOUNTER — Ambulatory Visit (HOSPITAL_BASED_OUTPATIENT_CLINIC_OR_DEPARTMENT_OTHER): Payer: Self-pay | Admitting: Certified Registered"

## 2023-06-25 DIAGNOSIS — I4819 Other persistent atrial fibrillation: Secondary | ICD-10-CM | POA: Insufficient documentation

## 2023-06-25 DIAGNOSIS — Z79899 Other long term (current) drug therapy: Secondary | ICD-10-CM | POA: Insufficient documentation

## 2023-06-25 DIAGNOSIS — E785 Hyperlipidemia, unspecified: Secondary | ICD-10-CM

## 2023-06-25 DIAGNOSIS — Z8673 Personal history of transient ischemic attack (TIA), and cerebral infarction without residual deficits: Secondary | ICD-10-CM | POA: Insufficient documentation

## 2023-06-25 DIAGNOSIS — I10 Essential (primary) hypertension: Secondary | ICD-10-CM

## 2023-06-25 DIAGNOSIS — K219 Gastro-esophageal reflux disease without esophagitis: Secondary | ICD-10-CM | POA: Insufficient documentation

## 2023-06-25 DIAGNOSIS — I4891 Unspecified atrial fibrillation: Secondary | ICD-10-CM

## 2023-06-25 DIAGNOSIS — I483 Typical atrial flutter: Secondary | ICD-10-CM | POA: Insufficient documentation

## 2023-06-25 DIAGNOSIS — M1990 Unspecified osteoarthritis, unspecified site: Secondary | ICD-10-CM | POA: Diagnosis not present

## 2023-06-25 DIAGNOSIS — I639 Cerebral infarction, unspecified: Secondary | ICD-10-CM | POA: Diagnosis not present

## 2023-06-25 DIAGNOSIS — I4892 Unspecified atrial flutter: Secondary | ICD-10-CM

## 2023-06-25 DIAGNOSIS — Z87891 Personal history of nicotine dependence: Secondary | ICD-10-CM | POA: Diagnosis not present

## 2023-06-25 HISTORY — PX: ATRIAL FIBRILLATION ABLATION: EP1191

## 2023-06-25 MED ORDER — SUGAMMADEX SODIUM 200 MG/2ML IV SOLN
INTRAVENOUS | Status: DC | PRN
  Administered 2023-06-25: 200 mg via INTRAVENOUS

## 2023-06-25 MED ORDER — PHENYLEPHRINE HCL-NACL 20-0.9 MG/250ML-% IV SOLN
INTRAVENOUS | Status: DC | PRN
Start: 2023-06-25 — End: 2023-06-25
  Administered 2023-06-25: 25 ug/min via INTRAVENOUS

## 2023-06-25 MED ORDER — PANTOPRAZOLE SODIUM 40 MG PO TBEC
40.0000 mg | DELAYED_RELEASE_TABLET | Freq: Every day | ORAL | 0 refills | Status: DC
  Filled 2023-06-25: qty 45, 45d supply, fill #0

## 2023-06-25 MED ORDER — HEPARIN (PORCINE) IN NACL 2000-0.9 UNIT/L-% IV SOLN
INTRAVENOUS | Status: DC | PRN
  Administered 2023-06-25 (×4): 1000 mL

## 2023-06-25 MED ORDER — PROTAMINE SULFATE 10 MG/ML IV SOLN
INTRAVENOUS | Status: DC | PRN
  Administered 2023-06-25: 35 mg via INTRAVENOUS

## 2023-06-25 MED ORDER — PHENYLEPHRINE HCL (PRESSORS) 10 MG/ML IV SOLN
INTRAVENOUS | Status: DC | PRN
  Administered 2023-06-25: 80 ug via INTRAVENOUS

## 2023-06-25 MED ORDER — HEPARIN SODIUM (PORCINE) 1000 UNIT/ML IJ SOLN
INTRAMUSCULAR | Status: DC | PRN
  Administered 2023-06-25: 5000 [IU] via INTRAVENOUS
  Administered 2023-06-25: 14000 [IU] via INTRAVENOUS

## 2023-06-25 MED ORDER — HEPARIN SODIUM (PORCINE) 1000 UNIT/ML IJ SOLN
INTRAMUSCULAR | Status: DC | PRN
  Administered 2023-06-25: 1000 [IU] via INTRAVENOUS

## 2023-06-25 MED ORDER — DEXAMETHASONE SODIUM PHOSPHATE 10 MG/ML IJ SOLN
INTRAMUSCULAR | Status: DC | PRN
  Administered 2023-06-25: 5 mg via INTRAVENOUS

## 2023-06-25 MED ORDER — PROPOFOL 10 MG/ML IV BOLUS
INTRAVENOUS | Status: DC | PRN
  Administered 2023-06-25: 150 mg via INTRAVENOUS
  Administered 2023-06-25: 50 mg via INTRAVENOUS

## 2023-06-25 MED ORDER — ONDANSETRON HCL 4 MG/2ML IJ SOLN: 4.0000 mg | Freq: Four times a day (QID) | INTRAMUSCULAR | Status: DC | PRN

## 2023-06-25 MED ORDER — HEPARIN SODIUM (PORCINE) 1000 UNIT/ML IJ SOLN
INTRAMUSCULAR | Status: AC
Start: 2023-06-25 — End: ?
  Filled 2023-06-25: qty 10

## 2023-06-25 MED ORDER — SODIUM CHLORIDE 0.9 % IV SOLN: 250.0000 mL | INTRAVENOUS | Status: DC | PRN

## 2023-06-25 MED ORDER — PANTOPRAZOLE SODIUM 40 MG PO TBEC
40.0000 mg | DELAYED_RELEASE_TABLET | Freq: Every day | ORAL | Status: DC
  Administered 2023-06-25: 40 mg via ORAL
  Filled 2023-06-25 (×2): qty 1

## 2023-06-25 MED ORDER — FENTANYL CITRATE (PF) 100 MCG/2ML IJ SOLN
INTRAMUSCULAR | Status: AC
  Filled 2023-06-25: qty 2

## 2023-06-25 MED ORDER — SODIUM CHLORIDE 0.9% FLUSH: 3.0000 mL | INTRAVENOUS | Status: DC | PRN

## 2023-06-25 MED ORDER — ROCURONIUM BROMIDE 10 MG/ML (PF) SYRINGE
PREFILLED_SYRINGE | INTRAVENOUS | Status: DC | PRN
  Administered 2023-06-25: 20 mg via INTRAVENOUS
  Administered 2023-06-25: 70 mg via INTRAVENOUS

## 2023-06-25 MED ORDER — ATROPINE SULFATE 1 MG/10ML IJ SOSY
PREFILLED_SYRINGE | INTRAMUSCULAR | Status: AC
  Filled 2023-06-25: qty 10

## 2023-06-25 MED ORDER — COLCHICINE 0.6 MG PO TABS
0.6000 mg | ORAL_TABLET | Freq: Two times a day (BID) | ORAL | Status: DC
  Administered 2023-06-25: 0.6 mg via ORAL
  Filled 2023-06-25: qty 1

## 2023-06-25 MED ORDER — APIXABAN 5 MG PO TABS
5.0000 mg | ORAL_TABLET | Freq: Two times a day (BID) | ORAL | Status: DC
  Administered 2023-06-25: 5 mg via ORAL
  Filled 2023-06-25: qty 1

## 2023-06-25 MED ORDER — LIDOCAINE 2% (20 MG/ML) 5 ML SYRINGE
INTRAMUSCULAR | Status: DC | PRN
  Administered 2023-06-25: 100 mg via INTRAVENOUS

## 2023-06-25 MED ORDER — COLCHICINE 0.6 MG PO TABS
0.6000 mg | ORAL_TABLET | Freq: Two times a day (BID) | ORAL | 0 refills | Status: DC
  Filled 2023-06-25: qty 10, 5d supply, fill #0

## 2023-06-25 MED ORDER — SODIUM CHLORIDE 0.9 % IV SOLN: INTRAVENOUS | Status: DC

## 2023-06-25 MED ORDER — ONDANSETRON HCL 4 MG/2ML IJ SOLN
INTRAMUSCULAR | Status: DC | PRN
  Administered 2023-06-25: 4 mg via INTRAVENOUS

## 2023-06-25 MED ORDER — ACETAMINOPHEN 325 MG PO TABS: 650.0000 mg | ORAL_TABLET | ORAL | Status: DC | PRN

## 2023-06-25 MED ORDER — SODIUM CHLORIDE 0.9% FLUSH
3.0000 mL | Freq: Two times a day (BID) | INTRAVENOUS | Status: DC
Start: 2023-06-25 — End: 2023-06-25

## 2023-06-25 MED ORDER — ATROPINE SULFATE 0.4 MG/ML IV SOLN
INTRAVENOUS | Status: DC | PRN
Start: 2023-06-25 — End: 2023-06-25
  Administered 2023-06-25: 1 mg via INTRAVENOUS

## 2023-06-25 NOTE — Anesthesia Postprocedure Evaluation (Signed)
 Anesthesia Post Note  Patient: Edward Zavala  Procedure(s) Performed: ATRIAL FIBRILLATION ABLATION     Patient location during evaluation: PACU Anesthesia Type: General Level of consciousness: awake and alert Pain management: pain level controlled Vital Signs Assessment: post-procedure vital signs reviewed and stable Respiratory status: spontaneous breathing, nonlabored ventilation, respiratory function stable and patient connected to nasal cannula oxygen Cardiovascular status: blood pressure returned to baseline and stable Postop Assessment: no apparent nausea or vomiting Anesthetic complications: no   No notable events documented.  Last Vitals:  Vitals:   06/25/23 1025 06/25/23 1030  BP: 114/83 115/83  Pulse: 75 76  Resp: 20 16  Temp: 36.4 C   SpO2: 93% 94%    Last Pain:  Vitals:   06/25/23 1025  TempSrc: Temporal  PainSc: 0-No pain   Pain Goal:                   Kelcey Wickstrom

## 2023-06-25 NOTE — Discharge Instructions (Signed)

## 2023-06-25 NOTE — Transfer of Care (Signed)
 Immediate Anesthesia Transfer of Care Note  Patient: Edward Zavala  Procedure(s) Performed: ATRIAL FIBRILLATION ABLATION  Patient Location: PACU  Anesthesia Type:General  Level of Consciousness: awake, alert , and oriented  Airway & Oxygen Therapy: Patient Spontanous Breathing  Post-op Assessment: Report given to RN and Post -op Vital signs reviewed and stable  Post vital signs: Reviewed and stable  Last Vitals:  Vitals Value Taken Time  BP    Temp    Pulse 84 06/25/23 0954  Resp 19 06/25/23 0954  SpO2 94 % 06/25/23 0954  Vitals shown include unfiled device data.  Last Pain:  Vitals:   06/25/23 0624  TempSrc: Oral  PainSc:          Complications: No notable events documented.

## 2023-06-25 NOTE — Progress Notes (Signed)
 Patient and wife was given discharge instructions. Both verbalized understanding.

## 2023-06-25 NOTE — Anesthesia Preprocedure Evaluation (Addendum)
 Anesthesia Evaluation  Patient identified by MRN, date of birth, ID band Patient awake    Reviewed: Allergy & Precautions, NPO status , Patient's Chart, lab work & pertinent test results  Airway Mallampati: I  TM Distance: >3 FB Neck ROM: Full    Dental  (+) Teeth Intact, Dental Advisory Given   Pulmonary former smoker   breath sounds clear to auscultation       Cardiovascular hypertension, Pt. on medications  Rhythm:Regular Rate:Normal  Echo:  1. Normal LVEF, 60-65%. MV tissue doppler normal but E/A >2.0. E/e'  normal. LA mildly dilated. TR velocity normal. Diastolic parameters are  indeterminate based on diagnostic criteria. RV noted to be moderate  dilated, raising concern for a right heart  issue to explain symptoms. Suspect an element of diastolic dysfunction as  well given abnormal E/A ratio. Left ventricular ejection fraction, by  estimation, is 60 to 65%. The left ventricle has normal function. The left  ventricle has no regional wall  motion abnormalities. Left ventricular diastolic parameters are  indeterminate.   2. Right ventricular systolic function is mildly reduced. The right  ventricular size is moderately enlarged. Tricuspid regurgitation signal is  inadequate for assessing PA pressure. The estimated right ventricular  systolic pressure is 25.7 mmHg.   3. Left atrial size was mildly dilated.   4. The mitral valve is grossly normal. Trivial mitral valve  regurgitation. No evidence of mitral stenosis.   5. The aortic valve is tricuspid. Aortic valve regurgitation is not  visualized. No aortic stenosis is present.   6. The inferior vena cava is normal in size with greater than 50%  respiratory variability, suggesting right atrial pressure of 3 mmHg.     Neuro/Psych  Headaches PSYCHIATRIC DISORDERS Anxiety Depression    CVA    GI/Hepatic Neg liver ROS,GERD  Medicated,,  Endo/Other  negative endocrine ROS     Renal/GU Renal disease     Musculoskeletal  (+) Arthritis ,    Abdominal   Peds  Hematology negative hematology ROS (+)   Anesthesia Other Findings - HLD  Reproductive/Obstetrics                             Anesthesia Physical Anesthesia Plan  ASA: 3  Anesthesia Plan: General   Post-op Pain Management: Tylenol  PO (pre-op)*   Induction: Intravenous  PONV Risk Score and Plan: 2 and Ondansetron  and Treatment may vary due to age or medical condition  Airway Management Planned: Oral ETT  Additional Equipment: None  Intra-op Plan:   Post-operative Plan: Extubation in OR  Informed Consent: I have reviewed the patients History and Physical, chart, labs and discussed the procedure including the risks, benefits and alternatives for the proposed anesthesia with the patient or authorized representative who has indicated his/her understanding and acceptance.     Dental advisory given  Plan Discussed with: CRNA  Anesthesia Plan Comments:        Anesthesia Quick Evaluation

## 2023-06-25 NOTE — H&P (Signed)
 Electrophysiology Office Note:     Date:  06/25/2023    ID:  Edward Zavala, DOB 01-23-1946, MRN [id number]   [facility] Cardiologist:  Luana Rumple, MD  [facility] Electrophysiologist:  None    Referring MD: Luana Rumple, MD    Chief Complaint: Atrial fibrillation   History of Present Illness:     Edward Zavala is a 78 year old man who I am seeing today for an evaluation of atrial fibrillation at the request of Edward Zavala.  The patient last saw Edward Zavala April 27, 2023.  His medical history includes stroke, hypertension, hyperlipidemia, GERD.   The patient had a cardioversion for his atrial fibrillation on April 01, 2023.  He felt improved energy levels following the cardioversion but then returned to atrial fibrillation shortly thereafter with return of fatigue.  He has monitored his rhythm at home using a [facility] devices.   He is being referred to discuss catheter ablation for his atrial fibrillation.   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  He confirms feeling better when in sinus rhythm.  He has done some reading already about catheter ablation.  Presents for AF ablation today. Procedure reviewed.   Objective Their past medical, social and family history was reviewed.     ROS:   Please see the history of present illness.    All other systems reviewed and are negative.   EKGs/Labs/Other Studies Reviewed:     The following studies were reviewed today:   February 15, 2023 echo EF normal, 60%.  RV mildly reduced.  Mildly dilated left atrium.  Trivial MR.   April 01, 2023 EKG shows sinus rhythm   March 09, 2023 EKG shows low amplitude atrial flutter waves with controlled ventricular rate   February 12, 2023 EKG shows sinus rhythm with first-degree AV delay   February 08, 2023 EKG shows atrial fibrillation       EKG Interpretation Date/Time:                  Thursday May 06 2023 09:31:09  EDT Ventricular Rate:         65 PR Interval:                   QRS Duration:             78 QT Interval:                 400 QTC Calculation:416 R Axis:                         9   Text Interpretation:Atrial fibrillation Confirmed by Harvie Liner [phone]) on 05/06/2023 9:35:18 AM      Physical Exam:     VS:  BP 147/102 (BP Location: Left Arm, Patient Position: Sitting, Cuff Size: Normal)   Pulse 56   Ht 5\' 8"  (1.727 m)   Wt 189 lb 12.8 oz (86.1 kg)   SpO2 96%   BMI 28.86 kg/m         Wt Readings from Last 3 Encounters:  05/06/23 189 lb 12.8 oz (86.1 kg)  04/27/23 187 lb 9.6 oz (85.1 kg)  03/09/23 187 lb 6.3 oz (85 kg)      GEN: no distress CARD: Irregularly irregular, No MRG RESP: No IWOB. CTAB.         Assessment ASSESSMENT AND PLAN:     1. Atrial flutter, unspecified  type (HCC)   2. Persistent atrial fibrillation (HCC)       #Persistent atrial fibrillation #Atrial flutter The patient has symptomatic atrial arrhythmias. He is on Eliquis  for stroke prophylaxis given elevated CHA2DS2-VASc.  I have discussed rhythm control options with the patient during today's clinic appointment including antiarrhythmic drugs and catheter ablation.  He is interested in pursuing catheter ablation which I think is very reasonable.  I have discussed the procedural details including the risks, recovery and likelihood of success and he wishes to proceed.   Discussed treatment options today for AF including antiarrhythmic drug therapy and ablation. Discussed risks, recovery and likelihood of success with each treatment strategy. Risk, benefits, and alternatives to EP study and ablation for afib were discussed. These risks include but are not limited to stroke, bleeding, vascular damage, tamponade, perforation, damage to the esophagus, lungs, phrenic nerve and other structures, pulmonary vein stenosis, worsening renal function, coronary vasospasm and death.  Discussed potential need for  repeat ablation procedures and antiarrhythmic drugs after an initial ablation. The patient understands these risk and wishes to proceed.  We will therefore proceed with catheter ablation at the next available time.  Carto, ICE, anesthesia are requested for the procedure.  Will also obtain CT PV protocol prior to the procedure to exclude LAA thrombus and further evaluate atrial anatomy.    Presents for AF ablation today. Procedure reviewed.     Signed, Leanora Prophet. Marven Slimmer, MD, [facility], [facility] 06/25/2023 Electrophysiology [facility]

## 2023-06-25 NOTE — Anesthesia Procedure Notes (Signed)
 Procedure Name: Intubation Date/Time: 06/25/2023 7:52 AM  Performed by: Bennett Brass, CRNAPre-anesthesia Checklist: Patient identified, Emergency Drugs available, Suction available and Patient being monitored Patient Re-evaluated:Patient Re-evaluated prior to induction Oxygen Delivery Method: Circle system utilized Preoxygenation: Pre-oxygenation with 100% oxygen Induction Type: IV induction Ventilation: Mask ventilation without difficulty Laryngoscope Size: Miller and 2 Grade View: Grade I Tube type: Oral Tube size: 7.0 mm Number of attempts: 1 Airway Equipment and Method: Stylet and Oral airway Placement Confirmation: ETT inserted through vocal cords under direct vision, positive ETCO2 and breath sounds checked- equal and bilateral Secured at: 22 cm Tube secured with: Tape Dental Injury: Teeth and Oropharynx as per pre-operative assessment

## 2023-06-26 ENCOUNTER — Encounter: Payer: Self-pay | Admitting: Cardiology

## 2023-06-28 ENCOUNTER — Telehealth (HOSPITAL_COMMUNITY): Payer: Self-pay

## 2023-06-28 ENCOUNTER — Encounter (HOSPITAL_COMMUNITY): Payer: Self-pay | Admitting: Cardiology

## 2023-06-28 NOTE — Telephone Encounter (Signed)
 Attempted to reach patient to follow up with procedure completed on 06/25/23, no answer. Left VM for patient to return call.

## 2023-06-28 NOTE — Telephone Encounter (Signed)
 Is there a visible rash or skin peeling? Would worry about a drug reaction, maybe to colchicine which is new for him.

## 2023-06-29 LAB — POCT ACTIVATED CLOTTING TIME: Activated Clotting Time: 308 s

## 2023-06-29 MED ORDER — FENTANYL CITRATE (PF) 100 MCG/2ML IJ SOLN
INTRAMUSCULAR | Status: DC | PRN
Start: 2023-06-25 — End: 2023-06-29
  Administered 2023-06-25: 100 ug via INTRAVENOUS

## 2023-06-29 NOTE — Addendum Note (Signed)
 Addendum  created 06/29/23 0828 by Gaye Kato, CRNA   Intraprocedure Meds edited

## 2023-07-23 ENCOUNTER — Ambulatory Visit (HOSPITAL_COMMUNITY)
Admission: RE | Admit: 2023-07-23 | Discharge: 2023-07-23 | Disposition: A | Discharge status: Home / Self Care | Source: Ambulatory Visit | Attending: Internal Medicine | Admitting: Internal Medicine

## 2023-07-23 VITALS — BP 150/108 | HR 88 | Ht 68.0 in | Wt 188.4 lb

## 2023-07-23 DIAGNOSIS — I10 Essential (primary) hypertension: Secondary | ICD-10-CM

## 2023-07-23 DIAGNOSIS — D6869 Other thrombophilia: Secondary | ICD-10-CM

## 2023-07-23 DIAGNOSIS — I4819 Other persistent atrial fibrillation: Secondary | ICD-10-CM | POA: Diagnosis not present

## 2023-07-23 DIAGNOSIS — I4891 Unspecified atrial fibrillation: Secondary | ICD-10-CM

## 2023-07-23 NOTE — Progress Notes (Signed)
 Primary Care Physician: Allene Ivan, MD Primary Cardiologist: Luana Rumple, MD Electrophysiologist: Boyce Byes, MD     Referring Physician: Dr. Lanny Plan is a 78 y.o. male with a history of CVA, HTN, HLD, GERD, and atrial fibrillation who presents for consultation in the [facility]rial [facility]. Patient is on Eliquis  5 mg BID for a CHADS2VASC score of 5.  On evaluation today, patient is currently in NSR. S/p Afib ablation on 06/25/23 by Dr. Marven Slimmer. No episodes of Afib since ablation. He has noted periodic elevated BP readings at home but hesitant to increase medication dose. No chest pain or SOB. Leg sites healed without issue. No missed doses of anticoagulant.  Today, he denies symptoms of orthopnea, PND, lower extremity edema, dizziness, presyncope, syncope, snoring, daytime somnolence, bleeding, or neurologic sequela. The patient is tolerating medications without difficulties and is otherwise without complaint today.    he has a BMI of Body mass index is 28.65 kg/m.Aaron Aas Filed Weights   07/23/23 1111  Weight: 85.5 kg    Current Outpatient Medications  Medication Sig Dispense Refill   ALPRAZolam  (XANAX ) 0.25 MG tablet Take 0.25 mg by mouth daily as needed for anxiety.     apixaban  (ELIQUIS ) 5 MG TABS tablet Take 1 tablet (5 mg total) by mouth 2 (two) times daily. 180 tablet 3   Armodafinil 250 MG tablet Take 250 mg by mouth daily.     atorvastatin  (LIPITOR ) 20 MG tablet TAKE 1 TABLET BY MOUTH DAILY 90 tablet 3   buPROPion  (WELLBUTRIN  XL) 150 MG 24 hr tablet Take 300 mg by mouth daily.     busPIRone  (BUSPAR ) 15 MG tablet Take 15 mg by mouth daily.     colchicine  0.6 MG tablet Take 0.6 mg by mouth as needed. For Gout     Erythromycin 250 MG TBEC Take 250 mg by mouth every evening.     losartan -hydrochlorothiazide  (HYZAAR) 100-12.5 MG tablet Take 1 tablet by mouth daily. 90 tablet 3   lurasidone (LATUDA) 40 MG TABS tablet Take 40 mg by  mouth daily.     pantoprazole  (PROTONIX ) 40 MG tablet Take 1 tablet (40 mg total) by mouth daily. No refills necessary / post procedure medication.  Patient to resume home omeprazole after completion of Protonix . 45 tablet 0   traZODone  (DESYREL ) 50 MG tablet Take 50 mg by mouth at bedtime as needed for sleep.     Vitamin D, Ergocalciferol, (DRISDOL) 1.25 MG (50000 UNIT) CAPS capsule Take 50,000 Units by mouth once a week.     omeprazole (PRILOSEC) 20 MG capsule Take 20 mg by mouth daily. (Patient not taking: Reported on 07/23/2023)     No current facility-administered medications for this encounter.    Atrial Fibrillation Management history:  Previous antiarrhythmic drugs: none Previous cardioversions: 04/01/23 Previous ablations: 06/25/23 Anticoagulation history: Eliquis  5 mg BID   ROS- All systems are reviewed and negative except as per the HPI above.  Physical Exam: BP (!) 150/108   Pulse 88   Ht 5\' 8"  (1.727 m)   Wt 85.5 kg   BMI 28.65 kg/m   GEN: Well nourished, well developed in no acute distress NECK: No JVD; No carotid bruits CARDIAC: Regular rate and rhythm, no murmurs, rubs, gallops RESPIRATORY:  Clear to auscultation without rales, wheezing or rhonchi  ABDOMEN: Soft, non-tender, non-distended EXTREMITIES:  No edema; No deformity   EKG today demonstrates  Vent. rate 88 BPM PR interval  230 ms QRS duration 82 ms QT/QTcB 348/421 ms P-R-T axes 24 -15 -22 Sinus rhythm with 1st degree A-V block Minimal voltage criteria for LVH, may be normal variant ( R in aVL ) Nonspecific ST and T wave abnormality Abnormal ECG When compared with ECG of 25-Jun-2023 10:01, Criteria for Septal infarct are no longer Present Nonspecific T wave abnormality now evident in Lateral leads  Echo 02/15/23 demonstrated   1. Normal LVEF, 60-65%. MV tissue doppler normal but E/A >2.0. E/e'  normal. LA mildly dilated. TR velocity normal. Diastolic parameters are  indeterminate based on diagnostic  criteria. RV noted to be moderate  dilated, raising concern for a right heart  issue to explain symptoms. Suspect an element of diastolic dysfunction as  well given abnormal E/A ratio. Left ventricular ejection fraction, by  estimation, is 60 to 65%. The left ventricle has normal function. The left  ventricle has no regional wall  motion abnormalities. Left ventricular diastolic parameters are  indeterminate.   2. Right ventricular systolic function is mildly reduced. The right  ventricular size is moderately enlarged. Tricuspid regurgitation signal is  inadequate for assessing PA pressure. The estimated right ventricular  systolic pressure is 25.7 mmHg.   3. Left atrial size was mildly dilated.   4. The mitral valve is grossly normal. Trivial mitral valve  regurgitation. No evidence of mitral stenosis.   5. The aortic valve is tricuspid. Aortic valve regurgitation is not  visualized. No aortic stenosis is present.   6. The inferior vena cava is normal in size with greater than 50%  respiratory variability, suggesting right atrial pressure of 3 mmHg.    ASSESSMENT & PLAN CHA2DS2-VASc Score = 5  The patient's score is based upon: CHF History: 0 HTN History: 1 Diabetes History: 0 Stroke History: 2 Vascular Disease History: 0 Age Score: 2 Gender Score: 0       ASSESSMENT AND PLAN: Persistent Atrial Fibrillation (ICD10:  I48.19) The patient's CHA2DS2-VASc score is 5, indicating a 7.2% annual risk of stroke.   S/p Afib/flutter ablation on 06/25/23 by Dr. Marven Slimmer.  He is currently in NSR.  Secondary Hypercoagulable State (ICD10:  D68.69) The patient is at significant risk for stroke/thromboembolism based upon his CHA2DS2-VASc Score of 5.  Continue Apixaban  (Eliquis ).  No missed doses.   Hypertension Patient noted elevated BP readings and we can increase hyzaar to maximum dose of 100-25 mg daily. Patient is hesitant to do this and notes previously he seemed to have better control  with lisinopril . He declines this and will monitor his BP at home. I recommended for him to keep a BP diary and can provide readings to PCP at upcoming appointment to discuss BP management.   Follow up with EP as scheduled.   Minnie Amber, PA-C  Afib Clinic [facility] [address] [place], [place] [address] [phone]

## 2023-07-27 ENCOUNTER — Encounter: Payer: Self-pay | Admitting: Cardiovascular Disease

## 2023-09-20 ENCOUNTER — Emergency Department (HOSPITAL_BASED_OUTPATIENT_CLINIC_OR_DEPARTMENT_OTHER)

## 2023-09-20 ENCOUNTER — Other Ambulatory Visit: Payer: Self-pay

## 2023-09-20 ENCOUNTER — Emergency Department (HOSPITAL_BASED_OUTPATIENT_CLINIC_OR_DEPARTMENT_OTHER)
Admission: EM | Admit: 2023-09-20 | Discharge: 2023-09-20 | Disposition: A | Discharge status: Home / Self Care | Attending: Emergency Medicine | Admitting: Emergency Medicine

## 2023-09-20 DIAGNOSIS — X58XXXA Exposure to other specified factors, initial encounter: Secondary | ICD-10-CM | POA: Diagnosis not present

## 2023-09-20 DIAGNOSIS — Z7901 Long term (current) use of anticoagulants: Secondary | ICD-10-CM | POA: Insufficient documentation

## 2023-09-20 DIAGNOSIS — I4891 Unspecified atrial fibrillation: Secondary | ICD-10-CM | POA: Diagnosis not present

## 2023-09-20 DIAGNOSIS — T148XXA Other injury of unspecified body region, initial encounter: Secondary | ICD-10-CM

## 2023-09-20 DIAGNOSIS — S6992XA Unspecified injury of left wrist, hand and finger(s), initial encounter: Secondary | ICD-10-CM | POA: Diagnosis present

## 2023-09-20 DIAGNOSIS — S61213A Laceration without foreign body of left middle finger without damage to nail, initial encounter: Secondary | ICD-10-CM | POA: Diagnosis not present

## 2023-09-20 DIAGNOSIS — Z79899 Other long term (current) drug therapy: Secondary | ICD-10-CM | POA: Diagnosis not present

## 2023-09-20 DIAGNOSIS — Z8673 Personal history of transient ischemic attack (TIA), and cerebral infarction without residual deficits: Secondary | ICD-10-CM | POA: Diagnosis not present

## 2023-09-20 DIAGNOSIS — Z23 Encounter for immunization: Secondary | ICD-10-CM | POA: Insufficient documentation

## 2023-09-20 DIAGNOSIS — S61233A Puncture wound without foreign body of left middle finger without damage to nail, initial encounter: Secondary | ICD-10-CM | POA: Insufficient documentation

## 2023-09-20 MED ORDER — LIDOCAINE HCL (PF) 1 % IJ SOLN: 5.0000 mL | Freq: Once | INTRAMUSCULAR | Status: DC

## 2023-09-20 MED ORDER — TETANUS-DIPHTH-ACELL PERTUSSIS 5-2.5-18.5 LF-MCG/0.5 IM SUSY
0.5000 mL | PREFILLED_SYRINGE | Freq: Once | INTRAMUSCULAR | Status: AC
  Administered 2023-09-20: 0.5 mL via INTRAMUSCULAR
  Filled 2023-09-20: qty 0.5

## 2023-09-20 MED ORDER — LIDOCAINE HCL (PF) 1 % IJ SOLN
5.0000 mL | Freq: Once | INTRAMUSCULAR | Status: DC
Start: 2023-09-20 — End: 2023-09-20

## 2023-09-20 NOTE — ED Triage Notes (Signed)
 Lac to left middle finger this morning. States finger is numb. Unsure of tetanus. Bleeding controlled.

## 2023-09-20 NOTE — Discharge Instructions (Addendum)
 Keep the area clean dry and covered.  You can wash daily with soap and water.  You can also apply Neosporin or bacitracin to the wound.  Return with fever, severe pain or other signs of infection like drainage from the wound.

## 2023-09-20 NOTE — ED Provider Notes (Signed)
 [place] EMERGENCY DEPARTMENT AT [facility] Provider Note   CSN: [id number] Arrival date & time: 09/20/23  1258     Patient presents with: Laceration   Edward Zavala is a 78 y.o. male.  Patient with past medical history of CVA, TIA, GERD, A-fib on Eliquis  presents to emergency room with complaint of puncture wound.  Patient reports this morning he accidentally drilled through the lateral aspect of his left middle finger.  Bleeding has stopped.  He does not think he has had tetanus shot in the last 10 years.  Feels lack of symptoms of this finger distally to wound.  No problems with range of motion or color sensation.    Laceration      Prior to Admission medications   Medication Sig Start Date End Date Taking? Authorizing Provider  ALPRAZolam  (XANAX ) 0.25 MG tablet Take 0.25 mg by mouth daily as needed for anxiety.    [provider]  apixaban  (ELIQUIS ) 5 MG TABS tablet Take 1 tablet (5 mg total) by mouth 2 (two) times daily. 02/15/23   Croitoru, Mihai, MD  Armodafinil 250 MG tablet Take 250 mg by mouth daily.    [provider]  atorvastatin  (LIPITOR ) 20 MG tablet TAKE 1 TABLET BY MOUTH DAILY 04/12/23   Jodie Lavern CROME, MD  buPROPion  (WELLBUTRIN  XL) 150 MG 24 hr tablet Take 300 mg by mouth daily. 03/24/22   [provider]  busPIRone  (BUSPAR ) 15 MG tablet Take 15 mg by mouth daily. 03/16/17   [provider]  colchicine  0.6 MG tablet Take 0.6 mg by mouth as needed. For Gout    [provider]  Erythromycin 250 MG TBEC Take 250 mg by mouth every evening. 10/30/22   [provider]  losartan -hydrochlorothiazide  (HYZAAR) 100-12.5 MG tablet Take 1 tablet by mouth daily. 04/27/23   Croitoru, Mihai, MD  lurasidone (LATUDA) 40 MG TABS tablet Take 40 mg by mouth daily. 07/13/23   [provider]  omeprazole (PRILOSEC) 20 MG capsule Take 20 mg by mouth daily. Patient not taking: Reported on 07/23/2023    [provider]  pantoprazole  (PROTONIX ) 40 MG tablet Take 1 tablet (40 mg total) by mouth daily. No refills necessary / post procedure medication.  Patient to resume home omeprazole after completion of Protonix . 06/25/23   Aniceto Daphne CROME, NP  traZODone  (DESYREL ) 50 MG tablet Take 50 mg by mouth at bedtime as needed for sleep. 01/19/23   [provider]  Vitamin D, Ergocalciferol, (DRISDOL) 1.25 MG (50000 UNIT) CAPS capsule Take 50,000 Units by mouth once a week. 12/26/22   [provider]    Allergies: Patient has no known allergies.    Review of Systems  Skin:  Positive for wound.    Updated Vital Signs BP (!) 146/97 (BP Location: Right Arm)   Pulse 80   Temp 98.7 F (37.1 C) (Oral)   Resp 18   SpO2 100%   Physical Exam Vitals and nursing note reviewed.  Constitutional:      General: He is not in acute distress.    Appearance: He is not toxic-appearing.  HENT:     Head: Normocephalic and atraumatic.  Eyes:     General: No scleral icterus.    Conjunctiva/sclera: Conjunctivae normal.  Cardiovascular:     Rate and Rhythm: Normal rate and regular rhythm.     Pulses: Normal pulses.     Heart sounds: Normal heart sounds.  Pulmonary:     Effort: Pulmonary effort  is normal. No respiratory distress.     Breath sounds: Normal breath sounds.  Abdominal:     General: Abdomen is flat. Bowel sounds are normal.     Palpations: Abdomen is soft.     Tenderness: There is no abdominal tenderness.  Musculoskeletal:     Comments: Left middle finger w/ small puncture would. No bleeding. Sensation w/ deep pressure. Normal ROM. Good cap refill.   Skin:    General: Skin is warm and dry.     Findings: No lesion.  Neurological:     General: No focal deficit present.     Mental Status: He is alert and oriented to person, place, and time. Mental status is at baseline.     (all labs ordered are listed, but only abnormal results are displayed) Labs Reviewed - No data to  display  EKG: None  Radiology: DG Finger Middle Left Result Date: 09/20/2023 CLINICAL DATA:  Middle finger laceration this morning. EXAM: LEFT MIDDLE FINGER 2+V COMPARISON:  None Available. FINDINGS: The mineralization and alignment are normal. There is no evidence of acute fracture or dislocation. Mild degenerative changes at the proximal and distal interphalangeal joints. Mild proximal soft tissue swelling without definite foreign body or soft tissue emphysema. IMPRESSION: Mild soft tissue swelling without evidence of foreign body or fracture. Electronically Signed   By: Elsie Perone M.D.   On: 09/20/2023 14:13     Procedures   Medications Ordered in the ED  Tdap (BOOSTRIX ) injection 0.5 mL (has no administration in time range)  lidocaine  (PF) (XYLOCAINE ) 1 % injection 5 mL (has no administration in time range)                                    Medical Decision Making Amount and/or Complexity of Data Reviewed Radiology: ordered.  Risk Prescription drug management.   Edward Zavala 78 y.o. presented today for a 1/2 cm laceration to their left middle finger. Working Ddx: FB, fracture, NV compromise, simple laceration.  R/o DDx: These ddx are considered less likely due to history of present illness and physical exam findings.   Patient presented for a 1/2 cm laceration to their left middle finger. Vascularly intact. Notes decreased sensation to lateral finger. Tetanus is not UTD, received here. Patient is in no distress.   Imaging: X-ray of left finger to rule out FB or fracture -no acute findings   Problem List / ED Course / Critical interventions / Medication management  Patient reports with puncture wound to his left middle finger.  Puncture is superficial, staying closed and nonbloody without intervention thus sutures were not placed today.  Area has been cleaned.  X-ray shows no evidence of fracture or foreign body. Given tdap.  Recommend elevation, ice, Neosporin and  keeping area clean dry and covered. Patients vitals assessed. Upon arrival patient is hemodynamically stable.  I have reviewed the patients home medicines and have made adjustments as needed   Plan: Patient is stable for discharge at this time. Patient/family expressed understanding of return precautions and need for follow-up.  Keep wound clean, covered. Educated on sx of concern regarding complications -- such as infection, poor wound healing, compartment sx.       Final diagnoses:  Puncture    ED Discharge Orders     None          Shermon Warren SAILOR, PA-C 09/20/23 1427    Plunkett, Ranger,  MD 09/20/23 [id number]

## 2023-09-24 ENCOUNTER — Ambulatory Visit: Attending: Physician Assistant | Admitting: Physician Assistant

## 2023-09-24 ENCOUNTER — Encounter: Payer: Self-pay | Admitting: Physician Assistant

## 2023-09-24 VITALS — BP 124/88 | HR 73 | Ht 68.0 in | Wt 191.0 lb

## 2023-09-24 DIAGNOSIS — D6869 Other thrombophilia: Secondary | ICD-10-CM | POA: Diagnosis not present

## 2023-09-24 DIAGNOSIS — I4891 Unspecified atrial fibrillation: Secondary | ICD-10-CM

## 2023-09-24 DIAGNOSIS — I10 Essential (primary) hypertension: Secondary | ICD-10-CM

## 2023-09-24 DIAGNOSIS — I4819 Other persistent atrial fibrillation: Secondary | ICD-10-CM

## 2023-09-24 NOTE — Patient Instructions (Signed)
 Medication Instructions:    Your physician recommends that you continue on your current medications as directed. Please refer to the Current Medication list given to you today.   *If you need a refill on your cardiac medications before your next appointment, please call your pharmacy*   Lab Work: NONE ORDERED  TODAY    If you have labs (blood work) drawn today and your tests are completely normal, you will receive your results only by: MyChart Message (if you have MyChart) OR A paper copy in the mail If you have any lab test that is abnormal or we need to change your treatment, we will call you to review the results.    Testing/Procedures: NONE ORDERED  TODAY     Follow-Up: At [facility], you and your health needs are our priority.  As part of our continuing mission to provide you with exceptional heart care, our providers are all part of one team.  This team includes your primary Cardiologist (physician) and Advanced Practice Providers or APPs (Physician Assistants and Nurse Practitioners) who all work together to provide you with the care you need, when you need it.  Your next appointment:    6 month(s)   Provider:    You may see Boyce Byes, MD or one of the following Advanced Practice Providers on your designated Care Team:   Mertha Abrahams, [place]      We recommend signing up for the patient portal called "MyChart".  Sign up information is provided on this After Visit Summary.  MyChart is used to connect with patients for Virtual Visits (Telemedicine).  Patients are able to view lab/test results, encounter notes, upcoming appointments, etc.  Non-urgent messages can be sent to your provider as well.   To learn more about what you can do with MyChart, go to [email or web address].   Other Instructions

## 2023-09-24 NOTE — Progress Notes (Signed)
  Cardiology Office Note:  .   Date:  09/24/2023  ID:  [name], DOB 09/26/1945, MRN [id number] PCP: Burney Darice CROME, MD  [facility] Providers Cardiologist:  Jerel Balding, MD Electrophysiologist:  OLE ONEIDA HOLTS, MD {  History of Present Illness: .   EBEN CHOINSKI is a 78 y.o. male w/PMHx of  HTN, stroke, HLD, GERD Coronary Ca++ and mild aortic atherosclerosis by CT AFib  AFlutter  Referred to Dr. HOLTS for symptomatic AFib, saw him 05/06/23, planned to pursue ablation strategy  AFib/flutter ablation 06/25/23  Saw the AFib clinic team 07/23/23, no symptoms of AFib, reported some elevated BPs, no procedural concerns   Today's visit is scheduled as his post ablation visit ROS:   He is doing well Has not had the fatigue that he felt with AFib No CP, palpitations or cardiac awareness No SOB No near syncope or syncope No bleeding or signs of bleeding (outside of trauma and some bruising on his arms)  Arrhythmia/AAD hx AFib PVI/CTI ablation 06/25/23  Studies Reviewed: SABRA    EKG done today and reviewed by myself:  SR 73bpm  06/25/23: EPS/ablation CONCLUSIONS: 1. Successful PVI 2. Successful ablation/isolation of the posterior wall 3. Successful ablation of the cavotricuspid isthmus for typical atrial flutter 4. Intracardiac echo reveals trivial pericardial effusion, normal LA architecture 5. No early apparent complications. 6. Colchicine  0.6mg  PO BID x 5 days 7. Protonix  40mg  PO daily x 45 days   06/04/23 cardiac CT IMPRESSION: 1. There is normal pulmonary vein drainage into the left atrium with ostial measurements above. 2. There is no thrombus in the left atrial appendage. 3. The esophagus runs lateral to the midline of the left atrium and is in proximity to the left lower pulmonary vein ostium. 4.  There is a possible small PFO. 5. Normal coronary origin. Left dominance. 6. CAC score of 600 which is 63rd percentile for age-, race-, and sex-matched  controls.     February 15, 2023 echo EF normal, 60%.  RV mildly reduced.  Mildly dilated left atrium.  Trivial MR.     Risk Assessment/Calculations:    Physical Exam:   VS:  There were no vitals taken for this visit.   Wt Readings from Last 3 Encounters:  07/23/23 188 lb 6.4 oz (85.5 kg)  06/25/23 192 lb (87.1 kg)  05/06/23 189 lb 12.8 oz (86.1 kg)    GEN: Well nourished, well developed in no acute distress NECK: No JVD; No carotid bruits CARDIAC: RRR, no murmurs, rubs, gallops RESPIRATORY:  CTA b/l without rales, wheezing or rhonchi  ABDOMEN: Soft, non-tender, non-distended EXTREMITIES: No edema; No deformity   ASSESSMENT AND PLAN: .    persistent AFib CHA2DS2Vasc is 6, on Eliquis , appropriately dosed No symptoms of AFib/arrhythmia  HTN Looks OK  Coronary Ca No symptoms C/w Dr. C/PMD  Secondary hypercoagulable state 2/2 AFib    Dispo: back in 36mo, sooner if needed  Signed, Charlies Macario Arthur, PA-C

## 2023-11-30 LAB — LAB REPORT - SCANNED
A1c: 5.3
EGFR: 60
PSA, Total: 0.58

## 2023-12-06 ENCOUNTER — Encounter: Payer: Self-pay | Admitting: Cardiovascular Disease

## 2023-12-10 NOTE — Progress Notes (Signed)
 Cardiology Office Note   Date:  12/16/2023  ID:  Edward Zavala, DOB [birth year], MRN [id number] PCP: Edward Darice CROME, MD   HeartCare Providers Cardiologist:  Edward Balding, MD Electrophysiologist:  Edward ONEIDA HOLTS, MD     History of Present Illness Edward Zavala is a 78 y.o. male with a past medical history of CAD per coronary CT, hypertension, history of TIA, occlusion of left vertebral artery, Barrett's esophagus, BPH, erectile dysfunction  06/25/2023 atrial fibrillation ablation 06/19/2023 cardiac CT possible small PFO, calcium  score 600, 63rd percentile 04/01/2023 DCCV 03/26/2023 monitor atrial flutter with slow rate 02/15/2023 echo EF 60 to 65%, LA mildly dilated, trivial MR 08/13/2021 Lexiscan  normal, low risk  He initially established with Dr. Jordan in 2023 for the evaluation of shortness of breath, Lexiscan  at that time was arranged which was abnormal, low risk study.  He was then evaluated by Dr. Balding on 02/12/2023 for new onset of atrial fibrillation at the behest of his PCP.  He had previously had a TIA.  Echocardiogram was arranged revealing an EF of 60 to 65%, mild dilatation of his left atrium and trivial MR.  Monitor was arranged revealing atrial flutter with a slow rate, he underwent cardioversion on 04/01/2023 >> was back in atrial fibrillation on 04/13/2023.  He ultimately underwent an atrial fibrillation ablation on 06/25/2023.  Most recently evaluated by Edward Arthur PA on 09/24/2023, he was doing well, no recurrence of atrial fibrillation that he was aware of, no changes were made to his medications or plan of care and he was advised to follow-up with EP in 6 months.  On 11/26/2023, he had been evaluated by his PCP, EKG at that time revealed atrial fibrillation he had also been self-monitoring with his Crist mobile and noticed he had been in atrial fibrillation approximately every third day.  He is completely asymptomatic.  He states mainly came in today as his PCP  was worried because he is getting ready to leave the country for a prolonged trip.  Today, he is in sinus rhythm.  We talked about several different options including repeating a monitor for A-fib burden, as well as low-dose Cardizem in case he notices elevated heart rhythms while he is on his trip to [place] however, he would simply like to do nothing at this time. He denies chest pain, palpitations, dyspnea, pnd, orthopnea, n, v, dizziness, syncope, edema, weight gain, or early satiety.   ROS: Review of Systems  All other systems reviewed and are negative.    Studies Reviewed EKG Interpretation Date/Time:  Thursday December 16 2023 08:43:00 EDT Ventricular Rate:  64 PR Interval:  264 QRS Duration:  84 QT Interval:  372 QTC Calculation: 383 R Axis:   -19  Text Interpretation: Sinus rhythm with 1st degree A-V block with Blocked Premature atrial complexes When compared with ECG of 24-Sep-2023 11:05, Premature atrial complexes are now Present Confirmed by Hoover Nest [phone]) on 12/16/2023 8:46:12 AM    Cardiac Studies & Procedures   ______________________________________________________________________________________________   STRESS TESTS  MYOCARDIAL PERFUSION IMAGING 08/13/2021  Interpretation Summary   The study is normal. The study is low risk.   No ST deviation was noted.   Left ventricular function is normal. Nuclear stress EF: 68 %. The left ventricular ejection fraction is hyperdynamic (>65%). End diastolic cavity size is normal. End systolic cavity size is normal.   Prior study available for comparison from 08/03/2018. No changes compared to prior study.  IMPRESSIONS Negative for stress induced  arrhythmias. 1st degree heart block at baseline, unable to augment heart rate to 85% predicted. Stress ECG nondiagnostic due to pharmacologic protocol. Normal left ventricular function and size. No perfusion defects.  CONCLUSIONS Negative study. Low risk. Unchanged from  2020 study.   ECHOCARDIOGRAM  ECHOCARDIOGRAM COMPLETE 02/15/2023  Narrative ECHOCARDIOGRAM REPORT    Patient Name:   Edward Zavala Date of Exam: 02/15/2023 Medical Rec #:  [id number]     Height:       68.0 in Accession #:    [id number]    Weight:       188.8 lb Date of Birth:  Mar 14, 1945     BSA:          1.994 m Patient Age:    77 years      BP:           104/59 mmHg Patient Gender: M             HR:           60 bpm. Exam Location:  Outpatient  Procedure: 2D Echo, Cardiac Doppler and Color Doppler  Indications:    New onset atrial fibrillation (HCC) [I48.91 (ICD-10-CM)]; Dyspnea on exertion [R06.09 (ICD-10-CM)]  History:        Patient has prior history of Echocardiogram examinations, most recent 04/25/2017. Stroke; Risk Factors:Hypertension and Dyslipidemia.  Sonographer:    Rosaline Fujisawa MHA, RDMS, RVT, RDCS Referring Phys: [id number] CROITORU   Sonographer Comments: Technically difficult study due to poor echo windows. Image acquisition challenging due to respiratory motion. IMPRESSIONS   1. Normal LVEF, 60-65%. MV tissue doppler normal but E/A >2.0. E/e' normal. LA mildly dilated. TR velocity normal. Diastolic parameters are indeterminate based on diagnostic criteria. RV noted to be moderate dilated, raising concern for a right heart issue to explain symptoms. Suspect an element of diastolic dysfunction as well given abnormal E/A ratio. Left ventricular ejection fraction, by estimation, is 60 to 65%. The left ventricle has normal function. The left ventricle has no regional wall motion abnormalities. Left ventricular diastolic parameters are indeterminate. 2. Right ventricular systolic function is mildly reduced. The right ventricular size is moderately enlarged. Tricuspid regurgitation signal is inadequate for assessing PA pressure. The estimated right ventricular systolic pressure is 25.7 mmHg. 3. Left atrial size was mildly dilated. 4. The mitral valve is grossly  normal. Trivial mitral valve regurgitation. No evidence of mitral stenosis. 5. The aortic valve is tricuspid. Aortic valve regurgitation is not visualized. No aortic stenosis is present. 6. The inferior vena cava is normal in size with greater than 50% respiratory variability, suggesting right atrial pressure of 3 mmHg.  FINDINGS Left Ventricle: Normal LVEF, 60-65%. MV tissue doppler normal but E/A >2.0. E/e' normal. LA mildly dilated. TR velocity normal. Diastolic parameters are indeterminate based on diagnostic criteria. RV noted to be moderate dilated, raising concern for a right heart issue to explain symptoms. Suspect an element of diastolic dysfunction as well given abnormal E/A ratio. Left ventricular ejection fraction, by estimation, is 60 to 65%. The left ventricle has normal function. The left ventricle has no regional wall motion abnormalities. The left ventricular internal cavity size was normal in size. There is no left ventricular hypertrophy. Left ventricular diastolic parameters are indeterminate.  Right Ventricle: The right ventricular size is moderately enlarged. No increase in right ventricular wall thickness. Right ventricular systolic function is mildly reduced. Tricuspid regurgitation signal is inadequate for assessing PA pressure. The tricuspid regurgitant velocity is 2.38 m/s, and with an assumed  right atrial pressure of 3 mmHg, the estimated right ventricular systolic pressure is 25.7 mmHg.  Left Atrium: Left atrial size was mildly dilated.  Right Atrium: Right atrial size was normal in size.  Pericardium: There is no evidence of pericardial effusion. Presence of epicardial fat layer.  Mitral Valve: The mitral valve is grossly normal. Trivial mitral valve regurgitation. No evidence of mitral valve stenosis.  Tricuspid Valve: The tricuspid valve is grossly normal. Tricuspid valve regurgitation is mild . No evidence of tricuspid stenosis.  Aortic Valve: The aortic valve  is tricuspid. Aortic valve regurgitation is not visualized. No aortic stenosis is present. Aortic valve mean gradient measures 1.0 mmHg. Aortic valve peak gradient measures 2.5 mmHg. Aortic valve area, by VTI measures 3.27 cm.  Pulmonic Valve: The pulmonic valve was grossly normal. Pulmonic valve regurgitation is trivial. No evidence of pulmonic stenosis.  Aorta: The aortic root and ascending aorta are structurally normal, with no evidence of dilitation.  Venous: The inferior vena cava is normal in size with greater than 50% respiratory variability, suggesting right atrial pressure of 3 mmHg.  IAS/Shunts: The atrial septum is grossly normal.   LEFT VENTRICLE PLAX 2D LVIDd:         3.55 cm   Diastology LVIDs:         2.27 cm   LV e' medial:    7.62 cm/s LV PW:         1.13 cm   LV E/e' medial:  12.0 LV IVS:        0.91 cm   LV e' lateral:   12.40 cm/s LVOT diam:     2.12 cm   LV E/e' lateral: 7.4 LV SV:         49 LV SV Index:   25 LVOT Area:     3.53 cm   RIGHT VENTRICLE RV S prime:     11.90 cm/s  LEFT ATRIUM           Index        RIGHT ATRIUM           Index LA diam:      4.57 cm 2.29 cm/m   RA Area:     21.00 cm LA Vol (A4C): 68.8 ml 34.50 ml/m  RA Volume:   57.00 ml  28.58 ml/m AORTIC VALVE AV Area (Vmax):    3.25 cm AV Area (Vmean):   3.07 cm AV Area (VTI):     3.27 cm AV Vmax:           79.10 cm/s AV Vmean:          53.500 cm/s AV VTI:            0.150 m AV Peak Grad:      2.5 mmHg AV Mean Grad:      1.0 mmHg LVOT Vmax:         72.80 cm/s LVOT Vmean:        46.600 cm/s LVOT VTI:          0.139 m LVOT/AV VTI ratio: 0.93  AORTA Ao Root diam: 3.00 cm Ao Asc diam:  3.85 cm  MITRAL VALVE               TRICUSPID VALVE MV Area (PHT): 4.06 cm    TR Peak grad:   22.7 mmHg MV Decel Time: 187 msec    TR Vmax:        238.00 cm/s MR Peak grad: 15.7 mmHg MR Vmax:  198.00 cm/s  SHUNTS MV E velocity: 91.20 cm/s  Systemic VTI:  0.14 m MV A velocity: 39.80  cm/s  Systemic Diam: 2.12 cm MV E/A ratio:  2.29  Darryle Decent MD Electronically signed by Darryle Decent MD Signature Date/Time: 02/15/2023/6:09:23 PM    Final    MONITORS  CARDIAC EVENT MONITOR 03/26/2023  Narrative   Throughout the recording the rhythm is atrial flutter with variable AV block.   During the first half of the recording, there was frequently moderate bradycardia including frequent pauses over 3 seconds, up to 4.2 seconds in duration. Although most pauses occured during expected sleep hours, some occurred during daytime hours as well. During the second half of the recording bradycardia is less prominent, although pauses of up to 3.2 seconds were still occasionally recorded, all of them at night.  During the last week of recording the average ventricular rate was 65 to 70 bpm (range 40-136 bpm).   Rapid ventricular rates are very infrequently seen, although on one occasion the heart rate increased to 137 bpm, during atrial flutter with predominantly 2: 1 AV block.   There is a single episode of wide-complex tachycardia, probably nonsustained ventricular tachycardia, 6 beats in duration.   Patient symptomatic events generally correlated with atrial flutter with slow ventricular rates.  Abnormal arrhythmia monitor due to persistent atrial flutter with variable AV block, mostly with slow ventricular response. Relatively slow ventricular rates are seen even after discontinuation of AV nodal blocking agents.       ______________________________________________________________________________________________      Risk Assessment/Calculations  CHA2DS2-VASc Score = 5   This indicates a 7.2% annual risk of stroke. The patient's score is based upon: CHF History: 0 HTN History: 1 Diabetes History: 0 Stroke History: 2 Vascular Disease History: 0 Age Score: 2 Gender Score: 0            Physical Exam VS:  BP 119/78 (BP Location: Left Arm)   Pulse 64   Ht 5' 8 (1.727  m)   Wt 192 lb (87.1 kg)   SpO2 96%   BMI 29.19 kg/m        Wt Readings from Last 3 Encounters:  12/16/23 192 lb (87.1 kg)  09/24/23 191 lb (86.6 kg)  07/23/23 188 lb 6.4 oz (85.5 kg)    GEN: Well nourished, well developed in no acute distress NECK: No JVD; No carotid bruits CARDIAC: RRR, no murmurs, rubs, gallops RESPIRATORY:  Clear to auscultation without rales, wheezing or rhonchi  ABDOMEN: Soft, non-tender, non-distended EXTREMITIES:  No edema; No deformity   ASSESSMENT AND PLAN Paroxysmal atrial fibrillation/hypercoagulable state-s/p ablation in May of this year, he has been noticing for the last few weeks on his Crist mobile that he has been out of rhythm however he is completely asymptomatic.  We discussed several options however he is leaving the country at the end of the week for a prolonged trip and feels well so he does not want to change anything at this time (discussed monitor for AF burden, as well as short acting Cardizem).  Continue Eliquis  5 mg twice daily--lab work checked by PCP last week we will request a copy of this but he denies hematochezia, hematuria, hemoptysis.  Had previously been on a beta-blocker however this was discontinued secondary to bradycardia.  CAD-noted on CT imaging, he is on Eliquis  therefore he is not on aspirin , continue Lipitor  20 mg daily. Stable with no anginal symptoms. No indication for ischemic evaluation.  Heart healthy diet and regular  cardiovascular exercise encouraged.    Hypertension-his blood pressure is well-controlled today at 119/78, continue Hyzaar 100-12.5 mg daily, Norvasc 5 mg daily.  Dyslipidemia-appears to be formally monitored by his PCP, goal to be less than 70 as he has nonobstructive coronary artery disease that was noted on CT imaging.       Dispo:  Follow up with EP in 6 weeks.   Signed, Delon JAYSON Hoover, NP

## 2023-12-16 ENCOUNTER — Encounter: Payer: Self-pay | Admitting: Cardiology

## 2023-12-16 ENCOUNTER — Ambulatory Visit: Attending: Cardiology | Admitting: Cardiology

## 2023-12-16 VITALS — BP 119/78 | HR 64 | Ht 68.0 in | Wt 192.0 lb

## 2023-12-16 DIAGNOSIS — D6859 Other primary thrombophilia: Secondary | ICD-10-CM

## 2023-12-16 DIAGNOSIS — I2510 Atherosclerotic heart disease of native coronary artery without angina pectoris: Secondary | ICD-10-CM

## 2023-12-16 DIAGNOSIS — I4819 Other persistent atrial fibrillation: Secondary | ICD-10-CM | POA: Diagnosis not present

## 2023-12-16 DIAGNOSIS — I10 Essential (primary) hypertension: Secondary | ICD-10-CM

## 2023-12-16 DIAGNOSIS — E782 Mixed hyperlipidemia: Secondary | ICD-10-CM

## 2023-12-16 NOTE — Patient Instructions (Signed)
 Thank you for choosing [facility]!     Medication Instructions:  No medication changes were made during today's visit.  *If you need a refill on your cardiac medications before your next appointment, please call your pharmacy*   Lab Work: No labs were ordered during today's visit.  If you have labs (blood work) drawn today and your tests are completely normal, you will receive your results only by: MyChart Message (if you have MyChart) OR A paper copy in the mail If you have any lab test that is abnormal or we need to change your treatment, we will call you to review the results.   Testing/Procedures: No procedures were ordered during today's visit.   Your next appointment:   6 week(s)   Provider:   Charlies Arthur           Follow-Up: At [facility], you and your health needs are our priority.  As part of our continuing mission to provide you with exceptional heart care, we have created designated Provider Care Teams.  These Care Teams include your primary Cardiologist (physician) and Advanced Practice Providers (APPs -  Physician Assistants and Nurse Practitioners) who all work together to provide you with the care you need, when you need it. We recommend signing up for the patient portal called MyChart.  Sign up information is provided on this After Visit Summary.  MyChart is used to connect with patients for Virtual Visits (Telemedicine).  Patients are able to view lab/test results, encounter notes, upcoming appointments, etc.  Non-urgent messages can be sent to your provider as well.   To learn more about what you can do with MyChart, go to [email or web address].

## 2024-01-25 NOTE — Progress Notes (Unsigned)
 Cardiology Office Note:  .   Date:  01/25/2024  ID:  Edward Zavala, DOB 1945/12/08, MRN [id number] PCP: Burney Darice CROME, MD  [facility] Providers Cardiologist:  Jerel Balding, MD Electrophysiologist:  OLE ONEIDA HOLTS, MD {  History of Present Illness: .   Edward Zavala is a 78 y.o. male w/PMHx of  HTN, stroke, HLD, GERD Coronary Ca++ and mild aortic atherosclerosis by CT AFib  AFlutter  Referred to Dr. Holts for symptomatic AFib, saw him 05/06/23, planned to pursue ablation strategy  AFib/flutter ablation 06/25/23  Saw the AFib clinic team 07/23/23, no symptoms of AFib, reported some elevated BPs, no procedural concerns  I saw him 09/24/23 He is doing well Has not had the fatigue that he felt with AFib No CP, palpitations or cardiac awareness No SOB No near syncope or syncope No bleeding or signs of bleeding (outside of trauma and some bruising on his arms) No changes made, planned for 6 mo visit  He saw cards APP 12/16/23 Noted asymptomatic AFib incidentally noted by his PMD and with Kardia rhythm checks (perhaps about every 3rd day) He was in SR this visit Discussed management options, he was pending a trip to [place], and preferred no changes, continued observation of burden.   Today's visit is scheduled to f/u on increased AFib burden ROS:   *** eliquis , dose, bleeding *** [place] *** symptoms, burden *** new EP MD >> PFA, AAD options, tikosyn, amio, multaq (perhaps no flecainide with Ca++     Arrhythmia/AAD hx AFib PVI/CTI ablation 06/25/23 Hx of BB stopped 2/2 bradycardia  Studies Reviewed: SABRA    EKG done today and reviewed by myself:  SR 73bpm  06/25/23: EPS/ablation CONCLUSIONS: 1. Successful PVI 2. Successful ablation/isolation of the posterior wall 3. Successful ablation of the cavotricuspid isthmus for typical atrial flutter 4. Intracardiac echo reveals trivial pericardial effusion, normal LA architecture 5. No early apparent  complications. 6. Colchicine  0.6mg  PO BID x 5 days 7. Protonix  40mg  PO daily x 45 days   06/04/23 cardiac CT IMPRESSION: 1. There is normal pulmonary vein drainage into the left atrium with ostial measurements above. 2. There is no thrombus in the left atrial appendage. 3. The esophagus runs lateral to the midline of the left atrium and is in proximity to the left lower pulmonary vein ostium. 4.  There is a possible small PFO. 5. Normal coronary origin. Left dominance. 6. CAC score of 600 which is 63rd percentile for age-, race-, and sex-matched controls.     February 15, 2023 echo EF normal, 60%.  RV mildly reduced.  Mildly dilated left atrium.  Trivial MR.     Risk Assessment/Calculations:    Physical Exam:   VS:  There were no vitals taken for this visit.   Wt Readings from Last 3 Encounters:  12/16/23 192 lb (87.1 kg)  09/24/23 191 lb (86.6 kg)  07/23/23 188 lb 6.4 oz (85.5 kg)    GEN: Well nourished, well developed in no acute distress NECK: No JVD; No carotid bruits CARDIAC: *** RRR, no murmurs, rubs, gallops RESPIRATORY: *** CTA b/l without rales, wheezing or rhonchi  ABDOMEN: Soft, non-tender, non-distended EXTREMITIES: *** No edema; No deformity   ASSESSMENT AND PLAN: .    persistent AFib CHA2DS2Vasc is 6, on Eliquis , appropriately dosed *** No symptoms of AFib/arrhythmia  HTN *** Looks OK  Coronary Ca *** No symptoms C/w Dr. C/PMD  Secondary hypercoagulable state 2/2 AFib    Dispo: back in ***,  sooner if needed  Signed, Charlies Macario Arthur, PA-C

## 2024-01-27 ENCOUNTER — Ambulatory Visit: Attending: Internal Medicine | Admitting: Physician Assistant

## 2024-01-27 VITALS — BP 124/86 | HR 77 | Ht 67.0 in | Wt 191.0 lb

## 2024-01-27 DIAGNOSIS — I4819 Other persistent atrial fibrillation: Secondary | ICD-10-CM

## 2024-01-27 DIAGNOSIS — I10 Essential (primary) hypertension: Secondary | ICD-10-CM

## 2024-01-27 DIAGNOSIS — D6869 Other thrombophilia: Secondary | ICD-10-CM | POA: Diagnosis not present

## 2024-01-27 NOTE — Patient Instructions (Signed)
 Medication Instructions:   Your physician recommends that you continue on your current medications as directed. Please refer to the Current Medication list given to you today.   *If you need a refill on your cardiac medications before your next appointment, please call your pharmacy*    Lab Work:  NONE ORDERED  TODAY    If you have labs (blood work) drawn today and your tests are completely normal, you will receive your results only by: MyChart Message (if you have MyChart) OR A paper copy in the mail If you have any lab test that is abnormal or we need to change your treatment, we will call you to review the results.    Testing/Procedures: NONE ORDERED  TODAY     Follow-Up: At [facility], you and your health needs are our priority.  As part of our continuing mission to provide you with exceptional heart care, our providers are all part of one team.  This team includes your primary Cardiologist (physician) and Advanced Practice Providers or APPs (Physician Assistants and Nurse Practitioners) who all work together to provide you with the care you need, when you need it.  Your next appointment:  3-4   month(s)  Provider:    Fonda Kitty, MD    We recommend signing up for the patient portal called MyChart.  Sign up information is provided on this After Visit Summary.  MyChart is used to connect with patients for Virtual Visits (Telemedicine).  Patients are able to view lab/test results, encounter notes, upcoming appointments, etc.  Non-urgent messages can be sent to your provider as well.   To learn more about what you can do with MyChart, go to [email or web address].   Other Instructions

## 2024-02-03 ENCOUNTER — Other Ambulatory Visit: Payer: Self-pay

## 2024-02-03 MED ORDER — APIXABAN 5 MG PO TABS: 5.0000 mg | ORAL_TABLET | Freq: Two times a day (BID) | ORAL | 1 refills | Status: AC

## 2024-02-03 NOTE — Telephone Encounter (Addendum)
 Prescription refill request for Eliquis  received. Indication: afib  Last office visit: ursuy, 01/27/2024 Scr:  1.28, 06/02/2023 Age: 78 yo  Weight: 86.6 kg   Refill sent.

## 2024-03-01 ENCOUNTER — Other Ambulatory Visit (HOSPITAL_COMMUNITY): Payer: Self-pay

## 2024-03-27 ENCOUNTER — Ambulatory Visit: Admitting: Physician Assistant

## 2024-05-02 ENCOUNTER — Ambulatory Visit: Admitting: Cardiology
